# Patient Record
Sex: Female | Born: 1980
Health system: Southern US, Community
[De-identification: ages and names within clinical notes are randomized; demographics above are authoritative.]

## PROBLEM LIST (undated history)

## (undated) ENCOUNTER — Emergency Department: Discharge status: Absent without leave | Payer: Self-pay

## (undated) DIAGNOSIS — F419 Anxiety disorder, unspecified: Secondary | ICD-10-CM

## (undated) DIAGNOSIS — E282 Polycystic ovarian syndrome: Secondary | ICD-10-CM

## (undated) DIAGNOSIS — B342 Coronavirus infection, unspecified: Secondary | ICD-10-CM

## (undated) DIAGNOSIS — F32A Depression, unspecified: Secondary | ICD-10-CM

## (undated) DIAGNOSIS — R42 Dizziness and giddiness: Secondary | ICD-10-CM

## (undated) DIAGNOSIS — E119 Type 2 diabetes mellitus without complications: Secondary | ICD-10-CM

## (undated) DIAGNOSIS — E039 Hypothyroidism, unspecified: Secondary | ICD-10-CM

## (undated) DIAGNOSIS — E786 Lipoprotein deficiency: Secondary | ICD-10-CM

## (undated) DIAGNOSIS — K760 Fatty (change of) liver, not elsewhere classified: Secondary | ICD-10-CM

## (undated) DIAGNOSIS — B019 Varicella without complication: Secondary | ICD-10-CM

## (undated) DIAGNOSIS — N979 Female infertility, unspecified: Secondary | ICD-10-CM

## (undated) HISTORY — DX: Lipoprotein deficiency: E78.6

## (undated) HISTORY — DX: Type 2 diabetes mellitus without complications: E11.9

## (undated) HISTORY — DX: Fatty (change of) liver, not elsewhere classified: K76.0

## (undated) HISTORY — DX: Polycystic ovarian syndrome: E28.2

## (undated) HISTORY — PX: EYE SURGERY: SHX253

## (undated) HISTORY — DX: Varicella without complication: B01.9

## (undated) HISTORY — DX: Female infertility, unspecified: N97.9

## (undated) HISTORY — PX: MANDIBLE SURGERY: SHX707

## (undated) HISTORY — DX: Depression, unspecified: F32.A

## (undated) HISTORY — PX: TONSILLECTOMY: SUR1361

## (undated) HISTORY — PX: NASAL SINUS SURGERY: SHX719

---

## 2006-11-25 ENCOUNTER — Other Ambulatory Visit: Admission: RE | Admit: 2006-11-25 | Discharge: 2006-11-25 | Payer: Self-pay | Admitting: Obstetrics and Gynecology

## 2007-05-19 ENCOUNTER — Encounter: Admission: RE | Admit: 2007-05-19 | Discharge: 2007-06-08 | Payer: Self-pay | Admitting: Obstetrics and Gynecology

## 2008-01-12 ENCOUNTER — Other Ambulatory Visit: Admission: RE | Admit: 2008-01-12 | Discharge: 2008-01-12 | Payer: Self-pay | Admitting: Obstetrics and Gynecology

## 2008-07-28 ENCOUNTER — Encounter: Admission: RE | Admit: 2008-07-28 | Discharge: 2008-07-28 | Payer: Self-pay | Admitting: Family Medicine

## 2009-12-06 ENCOUNTER — Observation Stay (HOSPITAL_COMMUNITY): Admission: AD | Admit: 2009-12-06 | Discharge: 2009-12-07 | Payer: Self-pay | Admitting: Obstetrics and Gynecology

## 2009-12-07 ENCOUNTER — Encounter: Payer: Self-pay | Admitting: Obstetrics & Gynecology

## 2009-12-25 ENCOUNTER — Inpatient Hospital Stay (HOSPITAL_COMMUNITY): Admission: AD | Admit: 2009-12-25 | Discharge: 2009-12-29 | Payer: Self-pay | Admitting: Obstetrics & Gynecology

## 2009-12-27 ENCOUNTER — Encounter (INDEPENDENT_AMBULATORY_CARE_PROVIDER_SITE_OTHER): Payer: Self-pay | Admitting: Obstetrics & Gynecology

## 2010-09-24 ENCOUNTER — Ambulatory Visit (INDEPENDENT_AMBULATORY_CARE_PROVIDER_SITE_OTHER): Payer: Self-pay | Admitting: Family Medicine

## 2010-09-24 DIAGNOSIS — Z713 Dietary counseling and surveillance: Secondary | ICD-10-CM

## 2010-10-07 LAB — CREATININE CLEARANCE, URINE, 24 HOUR
Collection Interval-CRCL: 24 hours
Creatinine, 24H Ur: 1916 mg/d — ABNORMAL HIGH (ref 700–1800)
Creatinine, Urine: 37.2 mg/dL
Creatinine: 0.53 mg/dL (ref 0.4–1.2)
Urine Total Volume-CRCL: 5150 mL

## 2010-10-07 LAB — COMPREHENSIVE METABOLIC PANEL
ALT: 127 U/L — ABNORMAL HIGH (ref 0–35)
ALT: 72 U/L — ABNORMAL HIGH (ref 0–35)
ALT: 21 U/L (ref 0–35)
AST: 29 U/L (ref 0–37)
AST: 85 U/L — ABNORMAL HIGH (ref 0–37)
AST: 19 U/L (ref 0–37)
Albumin: 2.8 g/dL — ABNORMAL LOW (ref 3.5–5.2)
Albumin: 2.9 g/dL — ABNORMAL LOW (ref 3.5–5.2)
Albumin: 2.7 g/dL — ABNORMAL LOW (ref 3.5–5.2)
Alkaline Phosphatase: 120 U/L — ABNORMAL HIGH (ref 39–117)
Alkaline Phosphatase: 79 U/L (ref 39–117)
BUN: 8 mg/dL (ref 6–23)
Calcium: 8.1 mg/dL — ABNORMAL LOW (ref 8.4–10.5)
Chloride: 102 mEq/L (ref 96–112)
Creatinine, Ser: 0.53 mg/dL (ref 0.4–1.2)
GFR calc Af Amer: >60 mL/min (ref >60)
GFR calc Af Amer: >60 mL/min (ref >60)
GFR calc Af Amer: >60 mL/min (ref >60)
GFR calc non Af Amer: >60 mL/min (ref >60)
Potassium: 3.9 mEq/L (ref 3.5–5.1)
Potassium: 3.6 mEq/L (ref 3.5–5.1)
Sodium: 134 mEq/L — ABNORMAL LOW (ref 135–145)
Sodium: 134 mEq/L — ABNORMAL LOW (ref 135–145)
Total Bilirubin: 0.3 mg/dL (ref 0.3–1.2)
Total Protein: 5.7 g/dL — ABNORMAL LOW (ref 6.0–8.3)
Total Protein: 5.9 g/dL — ABNORMAL LOW (ref 6.0–8.3)
Total Protein: 6.3 g/dL (ref 6.0–8.3)

## 2010-10-07 LAB — CBC
HCT: 40.2 % (ref 36.0–46.0)
HCT: 41 % (ref 36.0–46.0)
HCT: 37.2 % (ref 36.0–46.0)
Hemoglobin: 13.9 g/dL (ref 12.0–15.0)
MCHC: 33.8 g/dL (ref 30.0–36.0)
MCHC: 33.9 g/dL (ref 30.0–36.0)
MCV: 82.5 fL (ref 78.0–100.0)
MCV: 82.5 fL (ref 78.0–100.0)
Platelets: 310 10*3/uL (ref 150–400)
Platelets: 332 10*3/uL (ref 150–400)
Platelets: 347 10*3/uL (ref 150–400)
RDW: 16.2 % — ABNORMAL HIGH (ref 11.5–15.5)
RDW: 16.3 % — ABNORMAL HIGH (ref 11.5–15.5)
RDW: 16.3 % — ABNORMAL HIGH (ref 11.5–15.5)
RDW: 16.7 % — ABNORMAL HIGH (ref 11.5–15.5)
RDW: 16.1 % — ABNORMAL HIGH (ref 11.5–15.5)
WBC: 10.6 10*3/uL — ABNORMAL HIGH (ref 4.0–10.5)
WBC: 10.6 10*3/uL — ABNORMAL HIGH (ref 4.0–10.5)
WBC: 12.3 10*3/uL — ABNORMAL HIGH (ref 4.0–10.5)

## 2010-10-07 LAB — GLUCOSE, CAPILLARY
Glucose-Capillary: 107 mg/dL — ABNORMAL HIGH (ref 70–99)
Glucose-Capillary: 120 mg/dL — ABNORMAL HIGH (ref 70–99)
Glucose-Capillary: 122 mg/dL — ABNORMAL HIGH (ref 70–99)
Glucose-Capillary: 138 mg/dL — ABNORMAL HIGH (ref 70–99)
Glucose-Capillary: 142 mg/dL — ABNORMAL HIGH (ref 70–99)
Glucose-Capillary: 142 mg/dL — ABNORMAL HIGH (ref 70–99)
Glucose-Capillary: 155 mg/dL — ABNORMAL HIGH (ref 70–99)
Glucose-Capillary: 111 mg/dL — ABNORMAL HIGH (ref 70–99)
Glucose-Capillary: 139 mg/dL — ABNORMAL HIGH (ref 70–99)
Glucose-Capillary: 147 mg/dL — ABNORMAL HIGH (ref 70–99)
Glucose-Capillary: 93 mg/dL (ref 70–99)

## 2010-10-07 LAB — URIC ACID
Uric Acid, Serum: 4.8 mg/dL (ref 2.4–7.0)
Uric Acid, Serum: 5.2 mg/dL (ref 2.4–7.0)

## 2010-10-07 LAB — LACTATE DEHYDROGENASE: LDH: 131 U/L (ref 94–250)

## 2010-10-07 LAB — RPR: RPR Ser Ql: NONREACTIVE

## 2010-12-03 ENCOUNTER — Other Ambulatory Visit: Payer: Self-pay | Admitting: Dermatology

## 2011-09-13 DIAGNOSIS — Z8742 Personal history of other diseases of the female genital tract: Secondary | ICD-10-CM | POA: Insufficient documentation

## 2011-09-22 ENCOUNTER — Ambulatory Visit: Payer: Self-pay | Admitting: Family Medicine

## 2011-09-25 ENCOUNTER — Encounter: Payer: Self-pay | Admitting: Family Medicine

## 2011-09-25 ENCOUNTER — Ambulatory Visit (INDEPENDENT_AMBULATORY_CARE_PROVIDER_SITE_OTHER): Payer: 59 | Admitting: Family Medicine

## 2011-09-25 DIAGNOSIS — E282 Polycystic ovarian syndrome: Secondary | ICD-10-CM | POA: Insufficient documentation

## 2011-09-25 DIAGNOSIS — E669 Obesity, unspecified: Secondary | ICD-10-CM

## 2011-09-25 NOTE — Progress Notes (Signed)
Medical Nutrition Therapy:  Appt start time: 1000 end time:  1100.  Assessment:  Primary concerns today: Weight management.  Tammy Mooney has struggled with weight since high school.  Tammy Mooney was diagnosed with PCOS 3-4 yrs ago.  Tammy Mooney has found that increased activity has been most helpful to her wt mgmt, and Tammy Mooney has been exercising consistently for the past month, as well as trying to make healthier food choices.  Tammy Mooney has been tracking food and activity using My[employer]itnessPal.  Usual eating pattern includes 3 meals and 1 AM snack per day.  Everyday foods include breakfast of protein drink (in 2 c 1% milk)/cereal (Golden Grahams or Honey Nut Cheerios)/2 eggs.  Tammy Mooney likes a wide variety of foods.  Major obstacle to better choices is time constraints.   24-hr recall suggests intake of 2000 kcal: B (8 AM)-   16 oz protein shake (340 kcal, 36 g protein)  Snk ( AM)-   Banana, Greek yogurt (120 kcal)  L ( PM)-  Large salad (beans, olives, cheese, tom, let, car, 2 tbsp dressing), 8 oz corn chowder, 2 oatmeal-raisin cookies, water.  D ( PM)-  [place] Mike's 12-in Ital sub (680 kcal).   Usual physical activity includes [employer] personal trainer 30 min 1 X wk, plus 5 X wk hour of cardio & wts (various exercises).  Progress Towards Goal(s):  In progress.   Nutritional Diagnosis:  NI-5.6.2 Excessive fat intake As related to various food choices.  As evidenced by recent intake of 75 grams of fat.    Intervention:  Nutrition education.  Monitoring/Evaluation:  Dietary intake, exercise, and body weight in 3 weeks.

## 2011-09-25 NOTE — Patient Instructions (Addendum)
-   Breakfast:  Make sure you include a substantial amount of protein, i.e., at least 20 grams.  - Track your breakfast intake:  What, how much, and what time; then record time at which you first notice hunger.  Which breakfasts sustain you better?  - Consider a [place] sandwich for breakfast on low-carb bread.  - Limit fat intake to 35-45 grams per day.   - Carb's:  Choose high-fiber.  For example, choose only cereals with at least 5 grams of fiber per serving.   - Fruit:  The best choices will be berries, apples, oranges.   - Vegetables: Include at both lunch and dinner.    - Keep on hand frozen veg's, fresh baby carrots, sugar snap peas, cucumbers.  - Make a list of at least 7 meals you like, meet your nutritional needs, and are relatively easy to prepare.  Post this in the kitchen, and use as a basis for shopping.   - Goal:  Set aside planning time each week.  Put this calendar event in your phone.   - Check out website:  BodyPens.ca.  Look into glycemic load & glycemic index.

## 2011-10-16 ENCOUNTER — Ambulatory Visit: Payer: Self-pay | Admitting: Family Medicine

## 2011-10-20 ENCOUNTER — Ambulatory Visit: Payer: Self-pay | Admitting: Family Medicine

## 2011-10-27 ENCOUNTER — Encounter: Payer: Self-pay | Admitting: Family Medicine

## 2011-10-27 ENCOUNTER — Ambulatory Visit (INDEPENDENT_AMBULATORY_CARE_PROVIDER_SITE_OTHER): Payer: 59 | Admitting: Family Medicine

## 2011-10-27 VITALS — Ht 66.0 in | Wt 238.1 lb

## 2011-10-27 DIAGNOSIS — E669 Obesity, unspecified: Secondary | ICD-10-CM

## 2011-10-27 DIAGNOSIS — E282 Polycystic ovarian syndrome: Secondary | ICD-10-CM

## 2011-10-27 NOTE — Progress Notes (Signed)
Medical Nutrition Therapy:  Appt start time: 1000 end time:  1100.  Assessment:  Primary concerns today: Weight management.  Tammy Mooney has been exercising at least 1 hour 3-5 X wk for the past 2 months, including her [employer] personal trainer 30 min 1 X wk, plus 5 X wk hour of cardio & wts (various exercises).  She stopped tracking foods for a couple weeks, and has started feeling a little discouraged that she hadn't lost weight.  Tammy Mooney suspects she will need to severely limit her kcal to be able to realize any meaningful weight loss.   24-hr recall:  B (9 AM)-   3 mini donuts Brunch (11 AM)-  veg quiche, 1 biscuit w/ 1/4 c sausage gravy, 1 c fruit salad, 1 c orange juice L ( PM)-   water Snk ( PM)-   <2 oz cheese & 8 Ritz crackers, water, 4 oz grape juice,  D (6:30 PM)-   3/4 c potato salad, <1/4 c baked beans, 5 oz pot roast, salad w/ 2 tbsp dressing Snk ( PM)-   water Yesterday was atypical b/c they were entertaining relatives:  brunch instead of breakfast and lunch; juice; greater amount of food than usual.    Progress Towards Goal(s):  In progress.   Nutritional Diagnosis:  NI-5.6.2 Excessive fat intake As related to various food choices.  As evidenced by recent intake of 75 grams of fat.    Intervention:  Nutrition education.  Monitoring/Evaluation:  Dietary intake, exercise, and body weight in 2 weeks.

## 2011-10-27 NOTE — Patient Instructions (Signed)
-   Modifications of menu/food prep: Low-fat sausage, skim milk in biscuits and in gravy, use less fat in gravy (or use olive oil).  Offer seltzer with juice.     - Consider how you can make this the healthiest offerings for you and for everyone.  - Rule of thumb:  NO sweets in the AM.  How we start our day sets up our appetite.   - Breakfast:  For the next two weeks, write down what you eat, what time, and how much.  Then add also the time at which you first notice hunger.    - You'll probably find that the higher-protein meals help stave off hunger longer.     - Example:  Meat sandwich; Greek yogurt and fruit (and 1-2 tbsp nuts/seeds); Eggs with toast (prepared in olive oil or spray) and fruit; protein drink with fruit.     - Cereals:  You may see a difference between high- and low-fiber cereals.  Also, the amount of milk used can affect satiation, or you may find that yogurt instead of milk is helpful. - Experiment with cutting way back on your carb's to no more than one per meal.    - ONE carb portion = 15 g of carb / 1 slice bread / 1/2 c most "scoopable" carb foods, 1 oz of packaged carb snacks.    - Do not count carb's in fruit, veg's, or dairy.      - Make a list of at least 7 meals you like, meet your nutritional needs, and are relatively easy to prepare. Post this in the kitchen, and use as a basis for shopping.  - Goal: Set aside planning time each week. Put this calendar event in your phone.

## 2011-11-10 ENCOUNTER — Ambulatory Visit (INDEPENDENT_AMBULATORY_CARE_PROVIDER_SITE_OTHER): Payer: 59 | Admitting: Family Medicine

## 2011-11-10 ENCOUNTER — Encounter: Payer: Self-pay | Admitting: Family Medicine

## 2011-11-10 VITALS — Ht 66.0 in | Wt 239.0 lb

## 2011-11-10 DIAGNOSIS — E669 Obesity, unspecified: Secondary | ICD-10-CM

## 2011-11-10 DIAGNOSIS — E282 Polycystic ovarian syndrome: Secondary | ICD-10-CM

## 2011-11-10 NOTE — Progress Notes (Signed)
Medical Nutrition Therapy:  Appt start time: 1200 end time:  1245.  Assessment:  Primary concerns today: Weight management and diet for PCOS.  Tammy Mooney was sick a couple of weeks ago, so was unable to exercise, and ate little.  She has been eating higher-protein breakfasts on most days, but has not done especially well with other goals.  She feels pretty demoralized with no weight loss following a couple weeks of consistent exercise and recording daily intake.  We discussed the importance of formal monitoring of goals, and developed a goals sheet for Tammy Mooney to use.  Tammy Mooney is familiar with all of these concepts, but struggles putting them into practice, like most others.  Three-day food record indicated high-CHO breakfasts on 2 days, followed each day by lunch and/or dinners with inadequate veg's and excess CHO.    Progress Towards Goal(s):  In progress.   Nutritional Diagnosis:   Some progress on Fat goal:  NI-5.8.2 Excessive carbohydrate intake As related to primarily beverages.  As evidenced by soda and/or juice consumption each of previous 3 days. NI-5.6.2 Excessive fat intake As related to various food choices.  As evidenced by 3-day food record suggesting less than 75 grams of fat/day.    Intervention:  Nutrition education.  Monitoring/Evaluation:  Dietary intake, exercise, and body weight in 2 weeks.

## 2011-11-10 NOTE — Patient Instructions (Signed)
-   Advance planning for high-protein breakfasts:  Adriana Simas a big batch of chicken (or [place] breast).  - Continue to plan menus for the week, and shop with a list.   - Keep seltzer on hand just in case you have juice, and make it a habit, if you have juice, it's always with seltzer.   - Food goals:  1. No-calorie beverages only.  Choose water.    2. Limit carb's to 1 per meal and 1 per snack (= 15 grams of carb).    3. Vegetables twice a day.   - Making food a priority in your life will make all of your goals more accessible/achievable.  - Goals sheet daily.

## 2011-11-24 ENCOUNTER — Ambulatory Visit (INDEPENDENT_AMBULATORY_CARE_PROVIDER_SITE_OTHER): Payer: 59 | Admitting: Family Medicine

## 2011-11-24 ENCOUNTER — Encounter: Payer: Self-pay | Admitting: Family Medicine

## 2011-11-24 VITALS — Ht 66.0 in | Wt 236.1 lb

## 2011-11-24 DIAGNOSIS — E669 Obesity, unspecified: Secondary | ICD-10-CM

## 2011-11-24 DIAGNOSIS — E282 Polycystic ovarian syndrome: Secondary | ICD-10-CM

## 2011-11-24 NOTE — Progress Notes (Signed)
Medical Nutrition Therapy:  Appt start time: 1000 end time:  1100.  Assessment:  Primary concerns today: Weight management and diet for PCOS.  Tammy Mooney feels her success with keeping her goals has been ~50%.  She has done better at not eating out, and in getting veg's and limiting carb's at lunch time especially.  She has been exercising an hour 2-3 X wk; [facility]  (She now has to make the decision to [facility] to continue [facility].)  Her HDL increased from 23 to 40 during the 12-wk program.  Tammy Mooney has been getting a good amount of protein at breakfast most days.  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    Progress Towards Goal(s):  In progress.   Nutritional Diagnosis:   Some progress on Fat goal:  NI-5.8.2 Excessive carbohydrate intake As related to primarily beverages.  As evidenced by soda and/or juice consumption each of previous 3 days. NI-5.6.2 Excessive fat intake As related to various food choices.  As evidenced by 3-day food record suggesting less than 75 grams of fat/day.    Intervention:  Nutrition education.  Monitoring/Evaluation:  Dietary intake, exercise, and body weight in 2 weeks.

## 2011-11-24 NOTE — Patient Instructions (Signed)
-   For the next 5 wks especially, it'll be crucial that you plan your week ahead:  - Meals  - Exercise days - It will also be helpful to you to  - Remember the "mindless margin," and choose your foods mindfully.    - Do I want all of this?  - Am I really in the mood for this?  - Am I really hungry / this hungry? - The 3 Qs of a good food decision:  1. How hungry am I?  2. What am I in the mood for?  3. What's good for me? - Sources of mono-unsaturated fats: Avocado, unsalted nuts & seeds, olive oil, (canola & peanut).   - Continue to work on 3 food goals previously set, and use goals sheet (veg's 2 X day, no beverages with kcal, <1 carb per snack & meal).

## 2011-12-12 LAB — OB RESULTS CONSOLE RUBELLA ANTIBODY, IGM: Rubella: IMMUNE

## 2011-12-12 LAB — OB RESULTS CONSOLE HIV ANTIBODY (ROUTINE TESTING): HIV: NONREACTIVE

## 2011-12-12 LAB — OB RESULTS CONSOLE ABO/RH

## 2011-12-12 LAB — OB RESULTS CONSOLE GC/CHLAMYDIA: Gonorrhea: NEGATIVE

## 2011-12-18 ENCOUNTER — Ambulatory Visit: Payer: 59 | Admitting: Family Medicine

## 2012-01-01 ENCOUNTER — Ambulatory Visit (INDEPENDENT_AMBULATORY_CARE_PROVIDER_SITE_OTHER): Payer: 59 | Admitting: Family Medicine

## 2012-01-01 ENCOUNTER — Encounter: Payer: Self-pay | Admitting: Family Medicine

## 2012-01-01 VITALS — Ht 66.0 in | Wt 238.4 lb

## 2012-01-01 DIAGNOSIS — E669 Obesity, unspecified: Secondary | ICD-10-CM

## 2012-01-01 NOTE — Progress Notes (Signed)
Medical Nutrition Therapy:  Appt start time: 1400 end time:  1500.  Assessment:  Primary concerns today: Weight management and diet for PCOS.  Tammy Mooney feels her food choices have been pretty good.  She has been focused on her goals related to veg's, water, & soda, but has not been completing her goals sheet.   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  She has an exercise partner she's already talked to about working out together.    24-hr recall suggests intake of 1560 kcal:  B (7 AM)-  1 c Cocoa Crisp, 1 c 1% milk     200 Snk (10 AM)-  1 apple, cheese stick, 5 saltines    200 L (12:30 PM)-  [place]'s grilled chx brst w/ veg's, 1/2 rice, water  400 Snk ( PM)-  water D (6 PM)-  <1 c spaghetti, 3/4 c ground [place] sauce, 1/2 c broccoli, 3 small pcs frzn garlic bread, water, ice crm bar  Snk ( PM)-  water  Progress Towards Goal(s):  In progress.   Nutritional Diagnosis:   Good progress on both carb's and fat goals:  NI-5.8.2 Excessive carbohydrate intake As related to primarily beverages.  As evidenced by only occasional soda recently. NI-5.6.2 Excessive fat intake As related to various food choices.  As evidenced by 24-hr recall suggesting moderate/low-fat intake.    Intervention:  Nutrition education.  Monitoring/Evaluation:  Dietary intake, exercise, and body weight in 4 weeks.

## 2012-01-01 NOTE — Patient Instructions (Addendum)
-   Exercise goal: 60 min 2-3 X wk.    - Coordinate w/ husband and exercise partner for CONSISTENT weekly workout times.   - Physical activity throughout the day as much as possible.    - Micro-breaks:  5 min per hour or 1 min per 20 min.    - Take the long way around to the bathroom or to front or back office.   - Leland Johns video:  http://vimeo.EAV/40981191. - Complete goals sheet.   - Obtain more protein-based breakfasts.  If you do eat cereal, make sure it provides at least 5 grams of fiber per serving.    - Other cereals:  Wheat Chex, Raisin Bran and other cereals with "bran" in their name, All Bran, Bran Buds, Frosted Mini-Wheats, Shredded Wheat 'n Bran, some Kashi cereals.   - Remember the study about walking 15 min post-dinner to control blood sugar.

## 2012-05-18 ENCOUNTER — Encounter: Payer: Self-pay | Admitting: Family Medicine

## 2012-06-30 ENCOUNTER — Telehealth (HOSPITAL_COMMUNITY): Payer: Self-pay | Admitting: *Deleted

## 2012-06-30 ENCOUNTER — Encounter (HOSPITAL_COMMUNITY): Payer: Self-pay | Admitting: *Deleted

## 2012-06-30 ENCOUNTER — Inpatient Hospital Stay (HOSPITAL_COMMUNITY)
Admission: RE | Admit: 2012-06-30 | Discharge: 2012-07-03 | DRG: 766 | Disposition: A | Discharge status: Home / Self Care | Payer: 59 | Source: Ambulatory Visit | Attending: Obstetrics and Gynecology | Admitting: Obstetrics and Gynecology

## 2012-06-30 ENCOUNTER — Encounter (HOSPITAL_COMMUNITY): Payer: Self-pay

## 2012-06-30 VITALS — BP 99/61 | HR 88 | Temp 97.8°F | Resp 18 | Ht 66.0 in | Wt 265.0 lb

## 2012-06-30 DIAGNOSIS — O24419 Gestational diabetes mellitus in pregnancy, unspecified control: Secondary | ICD-10-CM

## 2012-06-30 DIAGNOSIS — O34219 Maternal care for unspecified type scar from previous cesarean delivery: Secondary | ICD-10-CM | POA: Diagnosis present

## 2012-06-30 DIAGNOSIS — Z98891 History of uterine scar from previous surgery: Secondary | ICD-10-CM

## 2012-06-30 DIAGNOSIS — O99892 Other specified diseases and conditions complicating childbirth: Secondary | ICD-10-CM | POA: Diagnosis present

## 2012-06-30 DIAGNOSIS — E669 Obesity, unspecified: Secondary | ICD-10-CM

## 2012-06-30 DIAGNOSIS — E282 Polycystic ovarian syndrome: Secondary | ICD-10-CM

## 2012-06-30 DIAGNOSIS — Z22330 Carrier of Group B streptococcus: Secondary | ICD-10-CM

## 2012-06-30 DIAGNOSIS — O99814 Abnormal glucose complicating childbirth: Principal | ICD-10-CM | POA: Diagnosis present

## 2012-06-30 LAB — CBC
HCT: 39.3 % (ref 36.0–46.0)
MCV: 80 fL (ref 78.0–100.0)
Platelets: 321 10*3/uL (ref 150–400)
RBC: 4.91 MIL/uL (ref 3.87–5.11)
WBC: 11.7 10*3/uL — ABNORMAL HIGH (ref 4.0–10.5)

## 2012-06-30 LAB — GLUCOSE, CAPILLARY: Glucose-Capillary: 118 mg/dL — ABNORMAL HIGH (ref 70–99)

## 2012-06-30 MED ORDER — TERBUTALINE SULFATE 1 MG/ML IJ SOLN: 0.2500 mg | Freq: Once | INTRAMUSCULAR | Status: AC | PRN

## 2012-06-30 MED ORDER — OXYTOCIN BOLUS FROM INFUSION: 500.0000 mL | INTRAVENOUS | Status: DC

## 2012-06-30 MED ORDER — LIDOCAINE HCL (PF) 1 % IJ SOLN: 30.0000 mL | INTRAMUSCULAR | Status: DC | PRN

## 2012-06-30 MED ORDER — ONDANSETRON HCL 4 MG/2ML IJ SOLN
4.0000 mg | Freq: Four times a day (QID) | INTRAMUSCULAR | Status: DC | PRN
  Administered 2012-06-30: 4 mg via INTRAVENOUS
  Filled 2012-06-30: qty 2

## 2012-06-30 MED ORDER — OXYTOCIN 40 UNITS IN LACTATED RINGERS INFUSION - SIMPLE MED
62.5000 mL/h | INTRAVENOUS | Status: DC
  Filled 2012-06-30: qty 1000

## 2012-06-30 MED ORDER — CITRIC ACID-SODIUM CITRATE 334-500 MG/5ML PO SOLN
30.0000 mL | ORAL | Status: DC | PRN
  Administered 2012-07-01: 30 mL via ORAL
  Filled 2012-06-30: qty 15

## 2012-06-30 MED ORDER — OXYCODONE-ACETAMINOPHEN 5-325 MG PO TABS: 1.0000 | ORAL_TABLET | ORAL | Status: DC | PRN

## 2012-06-30 MED ORDER — LACTATED RINGERS IV SOLN
INTRAVENOUS | Status: DC
  Administered 2012-06-30 – 2012-07-01 (×2): via INTRAVENOUS

## 2012-06-30 MED ORDER — ACETAMINOPHEN 325 MG PO TABS: 650.0000 mg | ORAL_TABLET | ORAL | Status: DC | PRN

## 2012-06-30 MED ORDER — FENTANYL CITRATE 0.05 MG/ML IJ SOLN
100.0000 ug | INTRAMUSCULAR | Status: DC | PRN
  Administered 2012-06-30: 100 ug via INTRAVENOUS
  Filled 2012-06-30: qty 2

## 2012-06-30 MED ORDER — ZOLPIDEM TARTRATE 5 MG PO TABS: 5.0000 mg | ORAL_TABLET | Freq: Every evening | ORAL | Status: DC | PRN

## 2012-06-30 MED ORDER — VANCOMYCIN HCL IN DEXTROSE 1-5 GM/200ML-% IV SOLN
1000.0000 mg | Freq: Two times a day (BID) | INTRAVENOUS | Status: DC
  Administered 2012-06-30 – 2012-07-01 (×2): 1000 mg via INTRAVENOUS
  Filled 2012-06-30 (×3): qty 200

## 2012-06-30 MED ORDER — LACTATED RINGERS IV SOLN: 500.0000 mL | INTRAVENOUS | Status: DC | PRN

## 2012-06-30 MED ORDER — IBUPROFEN 600 MG PO TABS: 600.0000 mg | ORAL_TABLET | Freq: Four times a day (QID) | ORAL | Status: DC | PRN

## 2012-06-30 NOTE — Telephone Encounter (Signed)
Preadmission screen  

## 2012-06-30 NOTE — H&P (Signed)
Tammy Mooney is a 31 y.o. female, G2P1001 at [redacted]w[redacted]d , presenting for IOL , Hx of previous C/S for breech presentation together with oligohydramnios.PCOS. Hx of Infertility. GDM- A2, had been on Metformin and transitioned to Glyburide 5mg s and better diet control at end of pregnancy. FBS: 87- 96 and postprandial <125  At [redacted] weeks GA.  BPP 06/28/12 8/8 fetal size AGA. Plan for Cervical Balloon, AROM. Consider Pitocin with IUPC versus C/S. Patient desires TOLAC  GBS positive - sensitivity - Vancomycin.  Patient Active Problem List  Diagnosis  . PCOS (polycystic ovarian syndrome)  . Obesity (BMI 35.0-39.9 without comorbidity)    History of present pregnancy: Patient entered care at [redacted]w[redacted]d.   EDC of12/18/13 was established by LMP and Sono.   Anatomy scan:  18 weeks, with normal findings and an anterior placenta.   Additional [name] evaluations:  03/05/12, 03/29/12, 04/19/12, 06/04/12, 06/28/12. Last BBP 06/28/12 8/8  Significant prenatal events:  GDM, GBS positive with sensitivity for Vancomycin.  Last evaluation: 06/28/12 at office with BBP   OB History    Grav Para Term Preterm Abortions TAB SAB Ect Mult Living   2 1 0 1      1     Past Medical History  Diagnosis Date  . H/O varicella   . PCOS (polycystic ovarian syndrome)   . Infections of genitourinary tract in pregnancy, unspecified as to episode of care(646.60)   . Diabetes mellitus without complication     glyburide   Past Surgical History  Procedure Date  . Cesarean section   . Tonsillectomy   . Eye surgery     lasic  . Nasal sinus surgery    Family History: family history includes Diabetes in her mother and paternal grandmother; Heart attack in her father; and Heart disease in her maternal grandfather. Social History:  reports that she has never smoked. She has never used smokeless tobacco. She reports that she does not drink alcohol. Her drug history not on file.   Prenatal Transfer Tool  Maternal Diabetes: Yes:  Diabetes  Type:  Insulin/Medication controlled, Diet controlled at end of pregnancy Glyburide 5mg s  Genetic Screening: Normal Maternal Ultrasounds/Referrals: Normal Fetal Ultrasounds or other Referrals:  None Maternal Substance Abuse:  No Significant Maternal Medications:  None Significant Maternal Lab Results: Lab values include: Group B Strep positive, Other: GDM    ROS:    Allergies  Allergen Reactions  . Penicillins        Last menstrual period 10/02/2011. Affect; AAO x 3 Lungs: CTAB Heart RRR without murmur Abd gravid, NT, FH  To dates Pelvic: normal Ext: norma;  FHR: 155 bpm UCs:  1: 10 mins  Prenatal labs: ABO, Rh: A/Positive/-- (05/24 0000) Antibody: Negative (05/24 0000) Rubella:   immune RPR: Nonreactive (05/24 0000)  HBsAg: Negative (05/24 0000)  HIV: Non-reactive (05/24 0000)  GBS: Positive (05/24 0000) Sickle cell/Hgb electrophoresis: not completed Pap:  normal GC: Negative Chlamydia: Negative Genetic screenings:  Quad - normal Glucola:  Abnormal GDM - Glyburide and diet control.   Assessment/Plan: IUP at [redacted]w[redacted]d IOL with cervical Balloon/traction GBS Positive - Sensitive to Vancomycin.  Plan: Cervical Balloon placement - to traction Vancomycin IV for GBS prophylaxis  Tammy Mooney, DENISECNM. 06/30/2012, 7:43 PM

## 2012-06-30 NOTE — Progress Notes (Signed)
Called Davies cnm, informed of pts foley bulb coming out, to recheck cervix in about an hour and call her back, pt  May rest at this time.

## 2012-06-30 NOTE — Progress Notes (Signed)
Patient is uncomfortable with vaginal examination. Cx was posterior on initial examination.cx brought to mid-osition and ZO:[id number] Cx anterior  O VSS Filed Vitals:   06/30/12 2000  BP: 138/76  Pulse: 93  Temp: 97.7 F (36.5 C)  Resp: 20   Patient is very anxious  Blood glucose 119 2 hrs pp  Fht's baseline: 155 bpm Cat 1 tracing   abd soft between uc      Contractions 1: 10 min      SVE: External Os : 2cm/ Internal Os : 1-2 cms/ 50%/-3, Vx. Cervical Balloon placed with aid of stylette trans cervically and balloon inflated with 60 ml of water. Foley to water traction. Patient has started to contract 1: 2 mins since Balloon placement. Monitoring B/P as Systolic slight elevated - anxious +++ A IOL with placement of Foley Balloon to traction.    Fentanyl 100 mcgs IV q2 hrly PRN P Continue present management.    When Balloon falls out will consider AROM to augment labor pattern.  Earl Gala, CNM.

## 2012-07-01 ENCOUNTER — Encounter (HOSPITAL_COMMUNITY): Payer: Self-pay | Admitting: Anesthesiology

## 2012-07-01 ENCOUNTER — Encounter (HOSPITAL_COMMUNITY)
Admission: RE | Disposition: A | Discharge status: Home / Self Care | Payer: Self-pay | Source: Ambulatory Visit | Attending: Obstetrics and Gynecology

## 2012-07-01 ENCOUNTER — Inpatient Hospital Stay (HOSPITAL_COMMUNITY): Payer: 59 | Admitting: Anesthesiology

## 2012-07-01 ENCOUNTER — Encounter (HOSPITAL_COMMUNITY): Payer: Self-pay

## 2012-07-01 DIAGNOSIS — Z98891 History of uterine scar from previous surgery: Secondary | ICD-10-CM

## 2012-07-01 LAB — PREPARE RBC (CROSSMATCH)

## 2012-07-01 LAB — GLUCOSE, CAPILLARY
Glucose-Capillary: 92 mg/dL (ref 70–99)
Glucose-Capillary: 91 mg/dL (ref 70–99)
Glucose-Capillary: 92 mg/dL (ref 70–99)

## 2012-07-01 SURGERY — Surgical Case
Anesthesia: Regional

## 2012-07-01 SURGERY — Surgical Case
Anesthesia: Spinal | Site: Abdomen | Wound class: Clean Contaminated

## 2012-07-01 MED ORDER — SODIUM CHLORIDE 0.9 % IJ SOLN: 3.0000 mL | INTRAMUSCULAR | Status: DC | PRN

## 2012-07-01 MED ORDER — NALBUPHINE SYRINGE 5 MG/0.5 ML
5.0000 mg | INJECTION | INTRAMUSCULAR | Status: DC | PRN
  Filled 2012-07-01: qty 1

## 2012-07-01 MED ORDER — FENTANYL CITRATE 0.05 MG/ML IJ SOLN
INTRAMUSCULAR | Status: DC | PRN
  Administered 2012-07-01: 25 ug via INTRATHECAL

## 2012-07-01 MED ORDER — FENTANYL CITRATE 0.05 MG/ML IJ SOLN: 25.0000 ug | INTRAMUSCULAR | Status: DC | PRN

## 2012-07-01 MED ORDER — DIPHENHYDRAMINE HCL 50 MG/ML IJ SOLN: 12.5000 mg | INTRAMUSCULAR | Status: DC | PRN

## 2012-07-01 MED ORDER — NALOXONE HCL 1 MG/ML IJ SOLN: 1.0000 ug/kg/h | INTRAVENOUS | Status: DC | PRN

## 2012-07-01 MED ORDER — MEPERIDINE HCL 25 MG/ML IJ SOLN: 6.2500 mg | INTRAMUSCULAR | Status: DC | PRN

## 2012-07-01 MED ORDER — LACTATED RINGERS IV SOLN
INTRAVENOUS | Status: DC | PRN
  Administered 2012-07-01 (×3): via INTRAVENOUS

## 2012-07-01 MED ORDER — MORPHINE SULFATE (PF) 0.5 MG/ML IJ SOLN
INTRAMUSCULAR | Status: DC | PRN
  Administered 2012-07-01: .1 mg via INTRATHECAL

## 2012-07-01 MED ORDER — OXYTOCIN 10 UNIT/ML IJ SOLN
INTRAMUSCULAR | Status: AC
  Filled 2012-07-01: qty 4

## 2012-07-01 MED ORDER — NALBUPHINE SYRINGE 5 MG/0.5 ML
INJECTION | INTRAMUSCULAR | Status: AC
  Administered 2012-07-01: 10 mg via SUBCUTANEOUS
  Filled 2012-07-01: qty 1

## 2012-07-01 MED ORDER — ONDANSETRON HCL 4 MG/2ML IJ SOLN: 4.0000 mg | INTRAMUSCULAR | Status: DC | PRN

## 2012-07-01 MED ORDER — SCOPOLAMINE 1 MG/3DAYS TD PT72
MEDICATED_PATCH | TRANSDERMAL | Status: AC
  Administered 2012-07-01: 1.5 mg via TRANSDERMAL
  Filled 2012-07-01: qty 1

## 2012-07-01 MED ORDER — BUPIVACAINE HCL (PF) 0.25 % IJ SOLN
INTRAMUSCULAR | Status: AC
  Filled 2012-07-01: qty 30

## 2012-07-01 MED ORDER — MORPHINE SULFATE 0.5 MG/ML IJ SOLN
INTRAMUSCULAR | Status: AC
  Filled 2012-07-01: qty 10

## 2012-07-01 MED ORDER — MIDAZOLAM HCL 2 MG/2ML IJ SOLN: 0.5000 mg | Freq: Once | INTRAMUSCULAR | Status: DC | PRN

## 2012-07-01 MED ORDER — KETOROLAC TROMETHAMINE 30 MG/ML IJ SOLN: 30.0000 mg | Freq: Four times a day (QID) | INTRAMUSCULAR | Status: DC | PRN

## 2012-07-01 MED ORDER — DIBUCAINE 1 % RE OINT: 1.0000 "application " | TOPICAL_OINTMENT | RECTAL | Status: DC | PRN

## 2012-07-01 MED ORDER — METHYLERGONOVINE MALEATE 0.2 MG/ML IJ SOLN: 0.2000 mg | INTRAMUSCULAR | Status: DC | PRN

## 2012-07-01 MED ORDER — LACTATED RINGERS IV SOLN
INTRAVENOUS | Status: DC | PRN
  Administered 2012-07-01: 18:00:00 via INTRAVENOUS

## 2012-07-01 MED ORDER — DIPHENHYDRAMINE HCL 25 MG PO CAPS: 25.0000 mg | ORAL_CAPSULE | Freq: Four times a day (QID) | ORAL | Status: DC | PRN

## 2012-07-01 MED ORDER — METOCLOPRAMIDE HCL 5 MG/ML IJ SOLN: 10.0000 mg | Freq: Three times a day (TID) | INTRAMUSCULAR | Status: DC | PRN

## 2012-07-01 MED ORDER — EPHEDRINE SULFATE 50 MG/ML IJ SOLN
INTRAMUSCULAR | Status: DC | PRN
  Administered 2012-07-01: 10 mg via INTRAVENOUS
  Administered 2012-07-01: 5 mg via INTRAVENOUS
  Administered 2012-07-01: 10 mg via INTRAVENOUS
  Administered 2012-07-01: 5 mg via INTRAVENOUS
  Administered 2012-07-01 (×2): 10 mg via INTRAVENOUS

## 2012-07-01 MED ORDER — ONDANSETRON HCL 4 MG/2ML IJ SOLN
INTRAMUSCULAR | Status: DC | PRN
  Administered 2012-07-01: 4 mg via INTRAVENOUS

## 2012-07-01 MED ORDER — DIPHENHYDRAMINE HCL 50 MG/ML IJ SOLN: 25.0000 mg | INTRAMUSCULAR | Status: DC | PRN

## 2012-07-01 MED ORDER — IBUPROFEN 600 MG PO TABS: 600.0000 mg | ORAL_TABLET | Freq: Four times a day (QID) | ORAL | Status: DC | PRN

## 2012-07-01 MED ORDER — ACETAMINOPHEN 10 MG/ML IV SOLN: 1000.0000 mg | Freq: Four times a day (QID) | INTRAVENOUS | Status: DC | PRN

## 2012-07-01 MED ORDER — BUPIVACAINE HCL (PF) 0.25 % IJ SOLN
INTRAMUSCULAR | Status: DC | PRN
  Administered 2012-07-01: 10 mL

## 2012-07-01 MED ORDER — SIMETHICONE 80 MG PO CHEW
80.0000 mg | CHEWABLE_TABLET | Freq: Three times a day (TID) | ORAL | Status: DC
  Administered 2012-07-01 – 2012-07-03 (×6): 80 mg via ORAL

## 2012-07-01 MED ORDER — KETOROLAC TROMETHAMINE 30 MG/ML IJ SOLN
INTRAMUSCULAR | Status: AC
  Administered 2012-07-01: 30 mg via INTRAVENOUS
  Filled 2012-07-01: qty 1

## 2012-07-01 MED ORDER — SIMETHICONE 80 MG PO CHEW: 80.0000 mg | CHEWABLE_TABLET | ORAL | Status: DC | PRN

## 2012-07-01 MED ORDER — TERBUTALINE SULFATE 1 MG/ML IJ SOLN: 0.2500 mg | Freq: Once | INTRAMUSCULAR | Status: DC | PRN

## 2012-07-01 MED ORDER — NALOXONE HCL 0.4 MG/ML IJ SOLN: 0.4000 mg | INTRAMUSCULAR | Status: DC | PRN

## 2012-07-01 MED ORDER — SCOPOLAMINE 1 MG/3DAYS TD PT72
1.0000 | MEDICATED_PATCH | Freq: Once | TRANSDERMAL | Status: DC
Start: 2012-07-01 — End: 2012-07-03

## 2012-07-01 MED ORDER — ONDANSETRON HCL 4 MG/2ML IJ SOLN
4.0000 mg | Freq: Three times a day (TID) | INTRAMUSCULAR | Status: DC | PRN
Start: 2012-07-01 — End: 2012-07-03

## 2012-07-01 MED ORDER — SENNOSIDES-DOCUSATE SODIUM 8.6-50 MG PO TABS
2.0000 | ORAL_TABLET | Freq: Every day | ORAL | Status: DC
  Administered 2012-07-01 – 2012-07-02 (×2): 2 via ORAL

## 2012-07-01 MED ORDER — KETOROLAC TROMETHAMINE 30 MG/ML IJ SOLN: 30.0000 mg | Freq: Four times a day (QID) | INTRAMUSCULAR | Status: AC | PRN

## 2012-07-01 MED ORDER — LACTATED RINGERS IV SOLN
INTRAVENOUS | Status: DC
  Administered 2012-07-02: 03:00:00 via INTRAVENOUS

## 2012-07-01 MED ORDER — ZOLPIDEM TARTRATE 5 MG PO TABS: 5.0000 mg | ORAL_TABLET | Freq: Every evening | ORAL | Status: DC | PRN

## 2012-07-01 MED ORDER — BUPIVACAINE IN DEXTROSE 0.75-8.25 % IT SOLN
INTRATHECAL | Status: DC | PRN
  Administered 2012-07-01: 1.6 mL via INTRATHECAL

## 2012-07-01 MED ORDER — OXYTOCIN 40 UNITS IN LACTATED RINGERS INFUSION - SIMPLE MED: 62.5000 mL/h | INTRAVENOUS | Status: AC

## 2012-07-01 MED ORDER — DEXTROSE 5 % IV SOLN: 1.0000 ug/kg/h | INTRAVENOUS | Status: DC | PRN

## 2012-07-01 MED ORDER — OXYTOCIN 40 UNITS IN LACTATED RINGERS INFUSION - SIMPLE MED
1.0000 m[IU]/min | INTRAVENOUS | Status: DC
  Administered 2012-07-01: 1 m[IU]/min via INTRAVENOUS

## 2012-07-01 MED ORDER — METHYLERGONOVINE MALEATE 0.2 MG PO TABS: 0.2000 mg | ORAL_TABLET | ORAL | Status: DC | PRN

## 2012-07-01 MED ORDER — ONDANSETRON HCL 4 MG PO TABS: 4.0000 mg | ORAL_TABLET | ORAL | Status: DC | PRN

## 2012-07-01 MED ORDER — TETANUS-DIPHTH-ACELL PERTUSSIS 5-2.5-18.5 LF-MCG/0.5 IM SUSP: 0.5000 mL | Freq: Once | INTRAMUSCULAR | Status: DC

## 2012-07-01 MED ORDER — OXYTOCIN 10 UNIT/ML IJ SOLN: 40.0000 [IU] | INTRAVENOUS | Status: DC | PRN

## 2012-07-01 MED ORDER — WITCH HAZEL-GLYCERIN EX PADS: 1.0000 "application " | MEDICATED_PAD | CUTANEOUS | Status: DC | PRN

## 2012-07-01 MED ORDER — OXYCODONE-ACETAMINOPHEN 5-325 MG PO TABS
1.0000 | ORAL_TABLET | ORAL | Status: DC | PRN
  Administered 2012-07-02: 1 via ORAL
  Administered 2012-07-02 – 2012-07-03 (×3): 2 via ORAL
  Filled 2012-07-01 (×3): qty 2
  Filled 2012-07-01: qty 1

## 2012-07-01 MED ORDER — SODIUM CHLORIDE 0.9 % IR SOLN
Status: DC | PRN
  Administered 2012-07-01: 1000 mL

## 2012-07-01 MED ORDER — OXYTOCIN 10 UNIT/ML IJ SOLN
40.0000 [IU] | INTRAVENOUS | Status: DC | PRN
  Administered 2012-07-01: 40 [IU] via INTRAVENOUS

## 2012-07-01 MED ORDER — ONDANSETRON HCL 4 MG/2ML IJ SOLN
INTRAMUSCULAR | Status: AC
  Filled 2012-07-01: qty 2

## 2012-07-01 MED ORDER — KETOROLAC TROMETHAMINE 30 MG/ML IJ SOLN: 15.0000 mg | Freq: Once | INTRAMUSCULAR | Status: DC | PRN

## 2012-07-01 MED ORDER — NALBUPHINE HCL 10 MG/ML IJ SOLN: 5.0000 mg | INTRAMUSCULAR | Status: DC | PRN

## 2012-07-01 MED ORDER — FENTANYL CITRATE 0.05 MG/ML IJ SOLN
INTRAMUSCULAR | Status: AC
  Filled 2012-07-01: qty 2

## 2012-07-01 MED ORDER — OXYTOCIN 40 UNITS IN LACTATED RINGERS INFUSION - SIMPLE MED: 1.0000 m[IU]/min | INTRAVENOUS | Status: DC

## 2012-07-01 MED ORDER — MENTHOL 3 MG MT LOZG: 1.0000 | LOZENGE | OROMUCOSAL | Status: DC | PRN

## 2012-07-01 MED ORDER — SCOPOLAMINE 1 MG/3DAYS TD PT72
1.0000 | MEDICATED_PATCH | Freq: Once | TRANSDERMAL | Status: DC
  Administered 2012-07-01: 1.5 mg via TRANSDERMAL

## 2012-07-01 MED ORDER — EPHEDRINE 5 MG/ML INJ
INTRAVENOUS | Status: AC
  Filled 2012-07-01: qty 10

## 2012-07-01 MED ORDER — PROMETHAZINE HCL 25 MG/ML IJ SOLN: 6.2500 mg | INTRAMUSCULAR | Status: DC | PRN

## 2012-07-01 MED ORDER — KETOROLAC TROMETHAMINE 30 MG/ML IJ SOLN
30.0000 mg | Freq: Four times a day (QID) | INTRAMUSCULAR | Status: DC | PRN
  Administered 2012-07-01: 30 mg via INTRAVENOUS

## 2012-07-01 MED ORDER — IBUPROFEN 600 MG PO TABS
600.0000 mg | ORAL_TABLET | Freq: Four times a day (QID) | ORAL | Status: DC
  Administered 2012-07-02 – 2012-07-03 (×6): 600 mg via ORAL
  Filled 2012-07-01 (×6): qty 1

## 2012-07-01 MED ORDER — KETOROLAC TROMETHAMINE 60 MG/2ML IM SOLN: 60.0000 mg | Freq: Once | INTRAMUSCULAR | Status: AC | PRN

## 2012-07-01 MED ORDER — DIPHENHYDRAMINE HCL 25 MG PO CAPS: 25.0000 mg | ORAL_CAPSULE | ORAL | Status: DC | PRN

## 2012-07-01 MED ORDER — GLYBURIDE 5 MG PO TABS
5.0000 mg | ORAL_TABLET | Freq: Every day | ORAL | Status: DC
  Administered 2012-07-01 – 2012-07-02 (×2): 5 mg via ORAL
  Filled 2012-07-01 (×3): qty 1

## 2012-07-01 MED ORDER — LANOLIN HYDROUS EX OINT: 1.0000 "application " | TOPICAL_OINTMENT | CUTANEOUS | Status: DC | PRN

## 2012-07-01 MED ORDER — PRENATAL MULTIVITAMIN CH
1.0000 | ORAL_TABLET | Freq: Every day | ORAL | Status: DC
  Administered 2012-07-02 – 2012-07-03 (×2): 1 via ORAL
  Filled 2012-07-01 (×2): qty 1

## 2012-07-01 MED ORDER — PHENYLEPHRINE HCL 10 MG/ML IJ SOLN
INTRAMUSCULAR | Status: DC | PRN
  Administered 2012-07-01: 40 ug via INTRAVENOUS
  Administered 2012-07-01 (×2): 80 ug via INTRAVENOUS
  Administered 2012-07-01: 40 ug via INTRAVENOUS
  Administered 2012-07-01 (×2): 80 ug via INTRAVENOUS
  Administered 2012-07-01 (×3): 40 ug via INTRAVENOUS

## 2012-07-01 MED ORDER — PHENYLEPHRINE 40 MCG/ML (10ML) SYRINGE FOR IV PUSH (FOR BLOOD PRESSURE SUPPORT)
PREFILLED_SYRINGE | INTRAVENOUS | Status: AC
  Filled 2012-07-01: qty 5

## 2012-07-01 MED ORDER — ONDANSETRON HCL 4 MG/2ML IJ SOLN: 4.0000 mg | Freq: Three times a day (TID) | INTRAMUSCULAR | Status: DC | PRN

## 2012-07-01 MED ORDER — NALBUPHINE SYRINGE 5 MG/0.5 ML
5.0000 mg | INJECTION | INTRAMUSCULAR | Status: DC | PRN
  Administered 2012-07-01: 10 mg via SUBCUTANEOUS
  Filled 2012-07-01: qty 1

## 2012-07-01 SURGICAL SUPPLY — 35 items
CLOTH BEACON ORANGE TIMEOUT ST (SAFETY) ×2 IMPLANT
CONTAINER PREFILL 10% NBF 15ML (MISCELLANEOUS) IMPLANT
DRAPE LG THREE QUARTER DISP (DRAPES) ×2 IMPLANT
DRESSING TELFA 8X3 (GAUZE/BANDAGES/DRESSINGS) IMPLANT
DRSG OPSITE 11X17.75 LRG (GAUZE/BANDAGES/DRESSINGS) ×1 IMPLANT
DRSG OPSITE POSTOP 4X10 (GAUZE/BANDAGES/DRESSINGS) ×1 IMPLANT
DURAPREP 26ML APPLICATOR (WOUND CARE) ×2 IMPLANT
ELECT REM PT RETURN 9FT ADLT (ELECTROSURGICAL) ×2
ELECTRODE REM PT RTRN 9FT ADLT (ELECTROSURGICAL) ×1 IMPLANT
EXTRACTOR VACUUM M CUP 4 TUBE (SUCTIONS) ×1 IMPLANT
GAUZE SPONGE 4X4 12PLY STRL LF (GAUZE/BANDAGES/DRESSINGS) ×2 IMPLANT
GLOVE BIO SURGEON STRL SZ7.5 (GLOVE) ×4 IMPLANT
GOWN PREVENTION PLUS LG XLONG (DISPOSABLE) ×4 IMPLANT
GOWN PREVENTION PLUS XLARGE (GOWN DISPOSABLE) ×2 IMPLANT
KIT ABG SYR 3ML LUER SLIP (SYRINGE) IMPLANT
NDL HYPO 25X1 1.5 SAFETY (NEEDLE) ×1 IMPLANT
NDL HYPO 25X5/8 SAFETYGLIDE (NEEDLE) IMPLANT
NEEDLE HYPO 25X1 1.5 SAFETY (NEEDLE) ×2 IMPLANT
NEEDLE HYPO 25X5/8 SAFETYGLIDE (NEEDLE) IMPLANT
NS IRRIG 1000ML POUR BTL (IV SOLUTION) ×2 IMPLANT
PACK C SECTION WH (CUSTOM PROCEDURE TRAY) ×2 IMPLANT
PAD ABD 7.5X8 STRL (GAUZE/BANDAGES/DRESSINGS) IMPLANT
PAD OB MATERNITY 4.3X12.25 (PERSONAL CARE ITEMS) IMPLANT
SLEEVE SCD COMPRESS KNEE MED (MISCELLANEOUS) IMPLANT
STAPLER VISISTAT 35W (STAPLE) ×2 IMPLANT
SUT MNCRL 0 VIOLET CTX 36 (SUTURE) ×2 IMPLANT
SUT MON AB 2-0 CT1 27 (SUTURE) ×2 IMPLANT
SUT MON AB-0 CT1 36 (SUTURE) ×4 IMPLANT
SUT MONOCRYL 0 CTX 36 (SUTURE) ×2
SUT PLAIN 0 NONE (SUTURE) IMPLANT
SUT PLAIN 2 0 XLH (SUTURE) ×1 IMPLANT
SYR CONTROL 10ML LL (SYRINGE) ×2 IMPLANT
TOWEL OR 17X24 6PK STRL BLUE (TOWEL DISPOSABLE) ×6 IMPLANT
TRAY FOLEY CATH 14FR (SET/KITS/TRAYS/PACK) ×2 IMPLANT
WATER STERILE IRR 1000ML POUR (IV SOLUTION) ×2 IMPLANT

## 2012-07-01 NOTE — Anesthesia Procedure Notes (Signed)
Spinal  Patient location during procedure: OR Start time: 07/01/2012 5:11 PM Staffing Anesthesiologist: Brayton Caves R Performed by: anesthesiologist  Preanesthetic Checklist Completed: patient identified, site marked, surgical consent, pre-op evaluation, timeout performed, IV checked, risks and benefits discussed and monitors and equipment checked Spinal Block Patient position: sitting Prep: DuraPrep Patient monitoring: heart rate, cardiac monitor, continuous pulse ox and blood pressure Approach: midline Location: L3-4 Injection technique: single-shot Needle Needle type: Sprotte  Needle gauge: 24 G Needle length: 9 cm Assessment Sensory level: T4 Additional Notes Patient identified.  Risk benefits discussed including failed block, incomplete pain control, headache, nerve damage, paralysis, blood pressure changes, nausea, vomiting, reactions to medication both toxic or allergic, and postpartum back pain.  Patient expressed understanding and wished to proceed.  All questions were answered.  Sterile technique used throughout procedure.  CSF was clear.  No parasthesia or other complications.  Please see nursing notes for vital signs.

## 2012-07-01 NOTE — Op Note (Signed)
Cesarean Section Procedure Note  Indications: failed induction and previous uterine incision kerr x one  Pre-operative Diagnosis: 39 week 1 day pregnancy.  Post-operative Diagnosis: same  Surgeon: Lenoard Aden   Assistants: Fredric Mare, CNM  Anesthesia: Local anesthesia 0.25.% bupivacaine and Spinal anesthesia  ASA Class: 2  Procedure Details  The patient was seen in the Holding Room. The risks, benefits, complications, treatment options, and expected outcomes were discussed with the patient.  The patient concurred with the proposed plan, giving informed consent. The risks of anesthesia, infection, bleeding and possible injury to other organs discussed. Injury to bowel, bladder, or ureter with possible need for repair discussed. Possible need for transfusion with secondary risks of hepatitis or HIV acquisition discussed. Post operative complications to include but not limited to DVT, PE and Pneumonia noted. The site of surgery properly noted/marked. The patient was taken to Operating Room # 1, identified as Tammy Mooney and the procedure verified as C-Section Delivery. A Time Out was held and the above information confirmed.  After induction of anesthesia, the patient was draped and prepped in the usual sterile manner. A Pfannenstiel incision was made and carried down through the subcutaneous tissue to the fascia. Fascial incision was made and extended transversely using Mayo scissors. The fascia was separated from the underlying rectus tissue superiorly and inferiorly. The peritoneum was identified and entered. Peritoneal incision was extended longitudinally. The utero-vesical peritoneal reflection was incised transversely and the bladder flap was bluntly freed from the lower uterine segment. A low transverse uterine incision(Kerr hysterotomy) was made. Delivered from OA presentation with vacuum assistance x one pull was a  female with Apgar scores of 9 at one minute and 9 at five minutes.  Bulb suctioning gently performed. Neonatal team in attendance.After the umbilical cord was clamped and cut cord blood was obtained for evaluation. The placenta was removed intact and appeared normal. The uterus was curetted with a dry lap pack. Good hemostasis was noted.The uterine outline, tubes and ovaries appeared normal. The uterine incision was closed with running locked sutures of 0 Monocryl x 2 layers. Hemostasis was observed. Lavage was carried out until clear The fascia was then reapproximated with running sutures of 0 Monocryl. 0 plain to close [place] layer.The skin was reapproximated with staples.  Instrument, sponge, and needle counts were correct prior the abdominal closure and at the conclusion of the case.   Findings: above  Estimated Blood Loss:  300 mL         Drains: foley                 Specimens: placenta                 Complications:  None; patient tolerated the procedure well.         Disposition: PACU - hemodynamically stable.         Condition: stable  Attending Attestation: I performed the procedure.

## 2012-07-01 NOTE — Progress Notes (Signed)
S: not feeling any pain with ctx - some tightness is all      Would like Dr Billy Coast for CS if necessary   O:  VS: Blood pressure 133/80, pulse 90, temperature 98 F (36.7 C), temperature source Oral, resp. rate 18, height 5\' 6"  (1.676 m), weight 120.203 kg (265 lb), last menstrual period 10/02/2011.        FHR : baseline 130 / variability moderate / accels + / decels none        Toco: contractions every 2-4 minutes / mild / MVU 100-120        Cervix : deferred        Membranes: clear fluid leakage  A: Latent labor     FHR category 1     GDM-A2     TOLAC     + GBS on ABX prophylaxis  P: Increase pitocin to achieve MVU 150-250      Epidural prn pain      Plan repeat CS if no cervical progression with adequate labor and time    [name] CNM, MSN 07/01/2012, 11:40 AM

## 2012-07-01 NOTE — Progress Notes (Signed)
Pt desires to stop with TOLAC and proceed with C/S.  Wiliam Ke CNM notified to come evaluate.

## 2012-07-01 NOTE — Transfer of Care (Signed)
Immediate Anesthesia Transfer of Care Note  Patient: Tammy Mooney  Procedure(s) Performed: Procedure(s) (LRB) with comments: CESAREAN SECTION (N/A) - Repeat cesarean section with delivery of baby girl at 35. Apgars 9/9.  Patient Location: PACU  Anesthesia Type:Spinal  Level of Consciousness: awake, alert , oriented and patient cooperative  Airway & Oxygen Therapy: Patient Spontanous Breathing  Post-op Assessment: Report given to PACU RN and Post -op Vital signs reviewed and stable  Post vital signs: Reviewed and stable  Complications: No apparent anesthesia complications

## 2012-07-01 NOTE — Progress Notes (Signed)
Patient ID: Tammy Mooney, female   DOB: 07-Jun-1981, 31 y.o.   MRN: [id number]  Called by nursing staff to examine patient - wants midwife to examine cervix - if no change then she wants a c-section   S:       frustrated - wants to have a c-section       she and spouse feel like it has taken too long - " it is almost 24 hours of labor"       no pain with ctx  O:  VS: Blood pressure 130/70, pulse 85, temperature 98.2 F (36.8 C), temperature source Oral, resp. rate 18, height 5\' 6"  (1.676 m), weight 120.203 kg (265 lb), last menstrual period 10/02/2011.        FHR : baseline 135 / variability moderate / accels + / decels none        Toco: contractions every 2-4 minutes / mild ctx / MVU not adequate under 150        Cervix : 3-4cm / 80% / vtx -2 with small caput        Membranes: clear fluid leakage  A: Latent labor     FHR category 1  P:  Discussed with patient and spouse - not in active labor yet which is why cervix has not changed. Her arrival to the hospital did not "start" her labor clock. The time of ROM was the start of labor - she is still in latent labor which may take 8-10 hours. Average first labor 16 hours. Anticipate a long labor because she has never labored before / previous CS / baby that remains high in pelvis at this point. There is no prediction for how long she may be in labor or whether she will or will not progress to a vaginal delivery. Patient's plan all along was to trial labor and if nothing happened to proceed to CS - did not want hours of labor without progress to cause exhaustion prior to surgery.   At this time - patient desires cessation of induction with progression to cesarean delivery. Discussion of cesarean section - risks and benefits reviewed / possible adhesions in pelvis from previous CS.  Preop for C-Section non-urgent Dr Billy Coast contacted  - post CS at 4:30 MD will call OR to schedule / preop orders initiated   [name] CNM, MSN 07/01/2012,  2:32 PM

## 2012-07-01 NOTE — Addendum Note (Signed)
Addendum  created 07/01/12 2115 by Tyrone Apple. Malen Gauze, MD   Modules edited:Orders, PRL Based Order Sets

## 2012-07-01 NOTE — Anesthesia Postprocedure Evaluation (Signed)
Anesthesia Post Note  Patient: Tammy Mooney  Procedure(s) Performed: Procedure(s) (LRB): CESAREAN SECTION (N/A)  Anesthesia type: Spinal  Patient location: PACU  Post pain: Pain level controlled  Post assessment: Post-op Vital signs reviewed  Last Vitals:  Filed Vitals:   07/01/12 1640  BP: 125/83  Pulse: 93  Temp: 36.8 C  Resp: 18    Post vital signs: Reviewed  Level of consciousness: awake  Complications: No apparent anesthesia complications

## 2012-07-01 NOTE — Progress Notes (Addendum)
Foley bulb fall out at 23.40 hrs.  SVE at time 3/80%/-3, anterior, soft, Vx. Patient had one dose of Fentanyl and was resting. Requested a few hours rest prior to AROM. Agreed AROM approx. 4.00 hrs.  O VSS Filed Vitals:   07/01/12 0503  BP: 137/79  Pulse: 90  Temp: 97.8 F (36.6 C)  Resp: 20   Blood glucose range: 90 - 130. Blood Glucose monitoring Q. 2hrly while in labor.  fhts category 1, baseline 155 bpm  abd soft between uc 1: 3 - 4 mins   At 4.15 hrs, AROM, Clear with blood tinge, IUPC and FSE placed  S/p AROM Contraction pattern has increased to 1: 2 - 3 mins SVE: 3 -4 /80%, soft, anterior, Vx well applied to Cx. Cx more favorable now. A/P Continue to monitor EFM and B/P and Blood Glucose. Will re evaluate at 7 - 7.30 hrs to see if Cx change and need        for introduction of Pitocin to advance labor curve.   Earl Gala, CNM.

## 2012-07-01 NOTE — Progress Notes (Signed)
Comfortable, Continues to contract 1: 2 - 3 mins - evaluation of progress in labor. O VSS       fhts category 1 baseline 155 bpm      abd soft between uc      Contractions 1: 2 - 3 mins      Vag: 3-4/ 70- 80% -2, anterior - no change A   Augmentation with Pitocin of 2 milliunits/min      EFM continuous and continue with B/P surveillance and Blood glucose monitoring P continue care    Possible RLTCS if IOL fails  Earl Gala, CNM.

## 2012-07-01 NOTE — Anesthesia Preprocedure Evaluation (Signed)
Anesthesia Evaluation  Patient identified by MRN, date of birth, ID band Patient awake    Reviewed: Allergy & Precautions, H&P , NPO status , Patient's Chart, lab work & pertinent test results  Airway Mallampati: III      Dental No notable dental hx.    Pulmonary neg pulmonary ROS,  breath sounds clear to auscultation  Pulmonary exam normal       Cardiovascular Exercise Tolerance: Good negative cardio ROS  Rhythm:regular Rate:Normal     Neuro/Psych negative neurological ROS  negative psych ROS   GI/Hepatic negative GI ROS, Neg liver ROS,   Endo/Other  negative endocrine ROSdiabetesMorbid obesity  Renal/GU negative Renal ROS  negative genitourinary   Musculoskeletal   Abdominal Normal abdominal exam  (+)   Peds  Hematology negative hematology ROS (+)   Anesthesia Other Findings  H/O varicella     PCOS (polycystic ovarian syndrome)        Infections of genitourinary tract in pregnancy, unspecified as to episode of care(646.60)     Diabetes mellitus without complication   glyburide    Reproductive/Obstetrics (+) Pregnancy                           Anesthesia Physical Anesthesia Plan  ASA: III  Anesthesia Plan: Spinal   Post-op Pain Management:    Induction:   Airway Management Planned:   Additional Equipment:   Intra-op Plan:   Post-operative Plan:   Informed Consent: I have reviewed the patients History and Physical, chart, labs and discussed the procedure including the risks, benefits and alternatives for the proposed anesthesia with the patient or authorized representative who has indicated his/her understanding and acceptance.     Plan Discussed with: Anesthesiologist, CRNA and Surgeon  Anesthesia Plan Comments:         Anesthesia Quick Evaluation

## 2012-07-01 NOTE — OR Nursing (Signed)
Uterus massaged by S. Johnchristopher Sarvis RN. Two tubes of cord blood sent to lab.  20cc of blood evacuated from uterus during uterine massage. 

## 2012-07-01 NOTE — Addendum Note (Signed)
Addendum  created 07/01/12 2115 by Jnyah Brazee A. Garold Sheeler, MD   Modules edited:Orders, PRL Based Order Sets    

## 2012-07-01 NOTE — Progress Notes (Signed)
Called Davies cnm, informed of unable to reach cervix, she will come in to evaluate pt, pt may come off monitor until then.

## 2012-07-02 ENCOUNTER — Encounter (HOSPITAL_COMMUNITY): Payer: Self-pay | Admitting: Obstetrics and Gynecology

## 2012-07-02 ENCOUNTER — Encounter (HOSPITAL_COMMUNITY): Payer: Self-pay

## 2012-07-02 LAB — CBC
HCT: 35.4 % — ABNORMAL LOW (ref 36.0–46.0)
Hemoglobin: 11.6 g/dL — ABNORMAL LOW (ref 12.0–15.0)
RBC: 4.37 MIL/uL (ref 3.87–5.11)
RDW: 15.4 % (ref 11.5–15.5)
WBC: 11.9 10*3/uL — ABNORMAL HIGH (ref 4.0–10.5)

## 2012-07-02 LAB — GLUCOSE, CAPILLARY
Glucose-Capillary: 91 mg/dL (ref 70–99)
Glucose-Capillary: 157 mg/dL — ABNORMAL HIGH (ref 70–99)

## 2012-07-02 NOTE — Anesthesia Postprocedure Evaluation (Signed)
  Anesthesia Post-op Note  Patient: Tammy Mooney  Procedure(s) Performed: Procedure(s) (LRB) with comments: CESAREAN SECTION (N/A) - Repeat cesarean section with delivery of baby girl at 53. Apgars 9/9.  Patient Location: PACU and Mother/Baby  Anesthesia Type:Regional  Level of Consciousness: awake, alert  and patient cooperative  Airway and Oxygen Therapy: Patient Spontanous Breathing  Post-op Pain: none  Post-op Assessment: Post-op Vital signs reviewed  Post-op Vital Signs: Reviewed and stable  Complications: No apparent anesthesia complications

## 2012-07-02 NOTE — Progress Notes (Signed)
Patient ID: Tammy Mooney, female   DOB: [birth year], 31 y.o.   MRN: [id number] POD # 1  Subjective: Pt reports feeling well, tired/ Pain controlled with ibuprofen Tolerating po/ Foley patent/ No n/v/Flatus neg Activity: out of bed and ambulate Bleeding is light Newborn info:  Information for the patient's newborn:  Jaeliana, Lococo Girl Ziana [[id number]]  female Feeding: breast   Objective: VS: Blood pressure 96/65, pulse 84, temperature 98 F (36.7 C), temperature source Oral, resp. rate 20.    Intake/Output Summary (Last 24 hours) at 07/02/12 0924 Last data filed at 07/02/12 0630  Gross per 24 hour  Intake   4460 ml  Output   2150 ml  Net   2310 ml      Basename 07/02/12 0555 06/30/12 2015  WBC 11.9* 11.7*  HGB 11.6* 13.3  HCT 35.4* 39.3  PLT 294 321    Blood type: --/--/A POS, A POS (12/11 2015) Rubella: Immune (05/24 0000)    Physical Exam:  General: alert, cooperative and no distress CV: Regular rate and rhythm Resp: clear Abdomen: soft, NT, hypoactive BS x 4 quads Incision: covered with honey comb dressing Uterine Fundus: firm, below umbilicus, nontender Lochia: minimal Ext: edema +1 to +2 pedal and pretib edema and Homans sign is negative, no sign of DVT    A/P: POD # 1/ G2 P1102 S/P Repeat C/Section d/t failed TOLAC Hx PCOS and GDM A2; remains on glyburide Will schedule staple removal in the office and have office call with appt details. Doing well Continue routine post op orders   Signed: Demetrius Revel, MSN, [facility] 07/02/2012, 9:24 AM

## 2012-07-02 NOTE — Progress Notes (Signed)
Inpatient Diabetes Program Recommendations  AACE/ADA: New Consensus Statement on Inpatient Glycemic Control (2013)  Target Ranges:  Prepandial:   less than 140 mg/dL      Peak postprandial:   less than 180 mg/dL (1-2 hours)      Critically ill patients:  140 - 180 mg/dL   Reason for Visit: Consult  Note: Patient noted to have GDM and had C/S on 12/12 @ 17:38 pm.  Would recommend checking blood glucose ACHS and discontinuing Glyburide for now and see how blood glucose trends without the Glyburide.  Called and talked with the patient and she states that her last A1C was 5.8% and she was previously on Metformin due to polycystic ovarian dx and was transitioned to Glyburide about 3-4 weeks ago.  Patient states that she will be in the hospital at least until tomorrow and perhaps until Sunday.  Therefore, please check blood glucose ACHS to determine how blood glucose trends post delivery.  Patient states that she has a PCP and will be following up with PCP and OBGYN after discharge.  Please contact Inpatient Diabetes office if additional questions arise.  Thanks, Orlando Penner, RN, BSN, CCRN Diabetes Coordinator Inpatient Diabetes Program [phone] - pager

## 2012-07-03 LAB — GLUCOSE, CAPILLARY: Glucose-Capillary: 95 mg/dL (ref 70–99)

## 2012-07-03 MED ORDER — OXYCODONE-ACETAMINOPHEN 5-325 MG PO TABS: 1.0000 | ORAL_TABLET | ORAL | Status: DC | PRN

## 2012-07-03 MED ORDER — IBUPROFEN 600 MG PO TABS: 600.0000 mg | ORAL_TABLET | Freq: Four times a day (QID) | ORAL | Status: DC

## 2012-07-03 NOTE — Discharge Summary (Signed)
POSTOPERATIVE DISCHARGE SUMMARY:  Patient ID: Tammy Mooney MRN: [id number] DOB/AGE: [birth year] 31 y.o.  Admit date: 06/30/2012 Discharge date:  07/03/2012   Admission Diagnoses: 1. Term pregnancy with history of c-section for trial of labor 2. Gestational diabetes A2   Discharge Diagnoses:  1. Term Pregnancy-delivered 2. Status post repeat c-section, failed trial of labor  Prenatal history: [id number]   EDC : 07/07/2012, by Other Basis  Prenatal care at [facility] since 1st trimester  Prenatal course complicated by gestational diabetes class A2, previous cesarean section, obesity  Prenatal Labs: ABO, Rh: A/Positive/-- (05/24 0000)  Antibody: Negative (05/24 0000)  Rubella: immune  RPR: Nonreactive (05/24 0000)  HBsAg: Negative (05/24 0000)  HIV: Non-reactive (05/24 0000)  GBS: Positive (05/24 0000)  Sickle cell/Hgb electrophoresis: not completed  Pap: normal  GC: Negative  Chlamydia: Negative  Genetic screenings: Quad - normal  Glucola: Abnormal GDM - Glyburide and diet control.  Medical / Surgical History :  Past medical history:  Past Medical History  Diagnosis Date  . H/O varicella   . PCOS (polycystic ovarian syndrome)   . Infections of genitourinary tract in pregnancy, unspecified as to episode of care(646.60)   . Diabetes mellitus without complication     glyburide    Past surgical history:  Past Surgical History  Procedure Date  . Cesarean section   . Tonsillectomy   . Eye surgery     lasic  . Nasal sinus surgery   . Cesarean section 07/01/2012    Procedure: CESAREAN SECTION;  Surgeon: Lenoard Aden, MD;  Location: [facility];  Service: Obstetrics;  Laterality: N/A;  Repeat cesarean section with delivery of baby girl at 3. Apgars 9/9.    Family History:  Family History  Problem Relation Age of Onset  . Diabetes Mother   . Heart attack Father   . Heart disease Maternal Grandfather   . Diabetes Paternal Grandmother      Social History:  reports that she has never smoked. She has never used smokeless tobacco. She reports that she does not drink alcohol or use illicit drugs.   Allergies: Penicillins    Current Medications at time of admission:  Prescriptions prior to admission  Medication Sig Dispense Refill  . glyBURIDE (DIABETA) 5 MG tablet Take 5 mg by mouth at bedtime.      . Magnesium 250 MG TABS Take 1 tablet by mouth daily.      . Prenatal Vit-Fe Sulfate-FA (PRENATAL VITAMIN PO) Take 1 tablet by mouth at bedtime.       . [DISCONTINUED] metFORMIN (GLUCOPHAGE) 1000 MG tablet Take 1,000 mg by mouth 1 day or 1 dose.            Intrapartum Course: Patient was admitted overnight for induction of labor with cervical balloon and artificial rupture of membranes once balloon expelled. She progressed to 3-4 cm dilation, however fetus vertex remained high station despite several hours of labor, patient declined continuing induction of labor with Pitocin and requested repeat cesarean section. After adequate counseling regarding risks of surgery, patient agreed and was transferred to operating room for procedure.   Procedures: Cesarean section delivery of female newborn by Dr Billy Coast  See operative report for further details  Postoperative / postpartum course: uneventful. Patient remains on Glyburide and reduced carb diet until evaluation at 1 week postpartum.  Physical Exam:  VSS: Blood pressure 99/61, pulse 88, temperature 97.8 F (36.6 C), temperature source Oral, resp. rate 18, height 5\' 6"  (1.676  m), weight 120.203 kg (265 lb), last menstrual period 10/02/2011, SpO2 98.00%, unknown if currently breastfeeding.   LABS:  Basename 07/02/12 0555 06/30/12 2015  WBC 11.9* 11.7*  HGB 11.6* 13.3  HCT 35.4* 39.3  PLT 294 321    I&O: I/O last 3 completed shifts: In: 2680 [P.O.:1680; I.V.:1000] Out: 2725 [Urine:2725]      Incision:  approximated with staples / no erythema / no ecchymosis / no  drainage Staples for removal in office one week post-op  Discharge Instructions:  Discharged Condition: good Activity: pelvic rest, weight lifting and driving restrictions x 2 weeks Diet: routine Medications:    Medication List     As of 07/03/2012 11:45 AM    STOP taking these medications         metFORMIN 1000 MG tablet   Commonly known as: GLUCOPHAGE      TAKE these medications         glyBURIDE 5 MG tablet   Commonly known as: DIABETA   Take 5 mg by mouth at bedtime.      ibuprofen 600 MG tablet   Commonly known as: ADVIL,MOTRIN   Take 1 tablet (600 mg total) by mouth every 6 (six) hours.      Magnesium 250 MG Tabs   Take 1 tablet by mouth daily.      oxyCODONE-acetaminophen 5-325 MG per tablet   Commonly known as: PERCOCET/ROXICET   Take 1-2 tablets by mouth every 4 (four) hours as needed (moderate - severe pain).      PRENATAL VITAMIN PO   Take 1 tablet by mouth at bedtime.       Condition: stable Postpartum Instructions: refer to practice specific booklet Discharge to: home Disposition: discharged home Follow up :      Follow-up Information    Follow up with [name], CNM. Schedule an appointment as soon as possible for a visit in 6 weeks.   Contact information:   [address] [place] [address] [phone]       Follow up with [facility] Schedule an appointment as soon as possible for a visit in 5 days. (for staple removal)    Contact information:   [address] [place] [address] [phone]          Signed: Arlan Organ 07/03/2012, 11:45 AM

## 2012-07-03 NOTE — Progress Notes (Signed)
Subjective: POD# 2 Information for the patient's newborn:  Tammy Mooney, Tammy Mooney [[id number]]  female   Reports feeling well, desires early DC. Feeding: breast Patient reports tolerating PO.  Breast symptoms: no concerns. Pain controlled with Motrin and Percocet. Denies HA/SOB/C/P/N/V/dizziness. Flatus present, + BM.Marland Kitchen She reports vaginal bleeding as normal, without clots.  She is ambulating, urinating without difficult.     Objective:   VS:  Filed Vitals:   07/02/12 1305 07/02/12 1628 07/02/12 2221 07/03/12 0625  BP: 110/60 108/66 93/59 99/61   Pulse: 92 94 96 88  Temp: 97.6 F (36.4 C) 98 F (36.7 C) 97.7 F (36.5 C) 97.8 F (36.6 C)  TempSrc: Oral Oral Oral Oral  Resp: 20 20 20 18   Height:      Weight:      SpO2: 98%        Intake/Output Summary (Last 24 hours) at 07/03/12 1126 Last data filed at 07/02/12 1651  Gross per 24 hour  Intake    240 ml  Output    800 ml  Net   -560 ml        Basename 07/02/12 0555 06/30/12 2015  WBC 11.9* 11.7*  HGB 11.6* 13.3  HCT 35.4* 39.3  PLT 294 321   CBG (last 3)   Basename 07/03/12 0642 07/02/12 2213 07/02/12 0728  GLUCAP 95 157* 91      Blood type: --/--/A POS, A POS (12/11 2015)  Rubella: Immune (05/24 0000)   TDaP last in 2011, flu vaccine up to date   Physical Exam:  General: alert, cooperative and no distress CV: Regular rate and rhythm Resp: clear Abdomen: soft, nontender, normal bowel sounds Incision: clean, dry, intact and opsite and staples intact for removal 1 week post-op Uterine Fundus: firm, below umbilicus, nontender Lochia: minimal Ext: edema trace and Homans sign is negative, no sign of DVT      Assessment/Plan: 31 y.o.   POD# 2. [id number]                  Active Problems:  Gestational diabetes mellitus, class A2  Postpartum care following cesarean delivery (12/12)  Doing well, stable.    GDM A2 continues on Glyburide 5 mg PO at HS, will reevaluate BS at 1 week post-op visit Routine  post-op care DC home w/ WOB instructions Staples for removal in office 1 week post-op  PAUL,DANIELA 07/03/2012, 11:26 AM

## 2012-07-03 NOTE — Discharge Instructions (Signed)
Breast Pumping Tips °Pumping your breast milk is a good way to stimulate milk production and have a steady supply of breast milk for your infant. Pumping is most helpful during your infant's growth spurts, when involving dad or a family member, or when you are away. There are several types of pumps available. They can be purchased at a baby or maternity store. You can begin pumping soon after delivery, but some experts believe that you should wait about four weeks to give your infant a bottle. °In general, the more you breastfeed or pump, the more milk you will have for your infant. It is also important to take good care of yourself. This will reduce stress and help your body to create a healthy supply of milk. Your caregiver or lactation consultant can give you the information and support you need in your efforts to breastfeed your infant. °PUMPING BREAST MILK  °Follow the tips below for successful breast pumping. °Take care of yourself. °· Drink enough water or fluids to keep urine clear or pale yellow. You may notice a thirsty feeling while breastfeeding. This is because your body needs more water to make breast milk. Keep a large water bottle handy. Make healthy drink choices such as unsweetened fruit juice, milk and water. Limit soda, coffee, and alcohol (wait 2 hours to feed or pump if you have an alcoholic drink.) °· Eat a healthy, well-balanced diet rich in fruits, vegetables, and whole grains. °· Exercise as recommended by your caregiver. °· Get plenty of sleep. Sleep when your infant sleeps. Ask friends and family for help if you need time to nap or rest. °· Do not smoke. Smoking can lower your milk supply and harm your infant. If you need help quitting, ask your caregiver for a program recommendation. °· Ask your caregiver about birth control options. Birth control pills may lower your milk supply. You may be advised to use condoms or other forms of birth control. °Relax and pump °Stimulating your  let-down reflex is the key to successful and effective pumping. This makes the milk in all parts of the breast flow more freely.  °· It is easier to pump breast milk (and breastfeed) while you are relaxed. Find techniques that work for you. Quiet private spaces, breast massage, soothing heat placed on the breast, music, and pictures or a tape recording of your infant may help you to relax and "let down" your milk. If you have difficulty with your let down, try smelling one of your infant's blankets or an item of clothing he or she has worn while you are pumping. °· When pumping, place the special suction cup (flange) directly over the nipple. It may be uncomfortable and cause nipple damage if it is not placed properly or is the wrong size. Applying a small amount of purified or modified lanolin to your nipple and the areola may help increase your comfort level. Also, you can change the speed and suction of many electric pumps to your comfort level. Your caregiver or lactation consultant can help you with this. °· If pumping continues to be painful, or you feel you are not getting very much milk when you pump, you may need a different type of pump. A lactation consultant can help you determine if this is the case. °· If you are with your infant, feed him or her on demand and try pumping after each feeding. This will boost your production, even if milk does not come out. You may not be able   to pump much milk at first, but keep up the routine, and this will change.  If you are working or away from your infant for several hours, try pumping for about 15 minutes every 2 to 3 hours. Pump both breasts at the same time if you can.  If your infant has a formula feeding, make sure you pump your milk around the same time to maintain your supply.  Begin pumping breast milk a few weeks before you return to work. This will help you develop techniques that work for you and will be able to store extra milk.  Find a source  of breastfeeding information that works well for you. TIPS FOR STORING BREAST MILK  Store breast milk in a sealable sterile bag, jar, or container provided with your pumping supplies.  Store milk in small amounts close to what your infant is drinking at each feeding.  Cool pumped milk in a refrigerator or cooler. Pumped milk can last at the back of the refrigerator for 3 to 8 days.  Place cooled milk at the back of the freezer for up to 3 months.  Thaw the milk in its container or bag in warm water up to 24 hours in advance. Do not use a microwave to thaw or heat milk. Do not refreeze the milk after it has been thawed.  Breast milk is safe to drink when left at room temperature (mid 70s or colder) for 4 to 8 hours. After that, throw it away.  Milk fat can separate and look funny. The color can vary slightly from day to day. This is normal. Always shake the milk before using it to mix the fat with the more watery portion. SEEK MEDICAL CARE IF:   You are having trouble pumping or feeding your infant.  You are concerned that you are not making enough milk.  You have nipple pain, soreness, or redness.  You have other questions or concerns related to you or your infant. Document Released: 12/25/2009 Document Revised: 09/29/2011 Document Reviewed: 12/25/2009 [facility] Patient Information 2013 [place], [place].

## 2012-07-04 LAB — TYPE AND SCREEN
ABO/RH(D): A POS
Unit division: 0

## 2012-07-08 ENCOUNTER — Inpatient Hospital Stay (HOSPITAL_COMMUNITY): Admission: AD | Admit: 2012-07-08 | Payer: Self-pay | Source: Ambulatory Visit | Admitting: Family Medicine

## 2012-07-12 ENCOUNTER — Encounter (HOSPITAL_COMMUNITY): Payer: Self-pay | Admitting: Emergency Medicine

## 2012-07-12 ENCOUNTER — Emergency Department (HOSPITAL_COMMUNITY): Payer: 59

## 2012-07-12 ENCOUNTER — Emergency Department (HOSPITAL_COMMUNITY)
Admission: EM | Admit: 2012-07-12 | Discharge: 2012-07-12 | Disposition: A | Discharge status: Home / Self Care | Payer: 59 | Attending: Emergency Medicine | Admitting: Emergency Medicine

## 2012-07-12 DIAGNOSIS — Y9389 Activity, other specified: Secondary | ICD-10-CM | POA: Insufficient documentation

## 2012-07-12 DIAGNOSIS — Z79899 Other long term (current) drug therapy: Secondary | ICD-10-CM | POA: Insufficient documentation

## 2012-07-12 DIAGNOSIS — X500XXA Overexertion from strenuous movement or load, initial encounter: Secondary | ICD-10-CM | POA: Insufficient documentation

## 2012-07-12 DIAGNOSIS — S298XXA Other specified injuries of thorax, initial encounter: Secondary | ICD-10-CM | POA: Insufficient documentation

## 2012-07-12 DIAGNOSIS — Y929 Unspecified place or not applicable: Secondary | ICD-10-CM | POA: Insufficient documentation

## 2012-07-12 DIAGNOSIS — R0789 Other chest pain: Secondary | ICD-10-CM | POA: Insufficient documentation

## 2012-07-12 DIAGNOSIS — Z8742 Personal history of other diseases of the female genital tract: Secondary | ICD-10-CM | POA: Insufficient documentation

## 2012-07-12 DIAGNOSIS — E119 Type 2 diabetes mellitus without complications: Secondary | ICD-10-CM | POA: Insufficient documentation

## 2012-07-12 DIAGNOSIS — Z8619 Personal history of other infectious and parasitic diseases: Secondary | ICD-10-CM | POA: Insufficient documentation

## 2012-07-12 MED ORDER — OXYCODONE-ACETAMINOPHEN 5-325 MG PO TABS
2.0000 | ORAL_TABLET | Freq: Once | ORAL | Status: AC
  Administered 2012-07-12: 2 via ORAL
  Filled 2012-07-12: qty 2

## 2012-07-12 NOTE — ED Notes (Addendum)
Pt c/o pain on rt side of chest. Noticed a sudden pain when she reached over to grab something. Has had pain since 1900 today. She states she took Percocet at home and did not help. States that she has noticed more shallow breathes. Difficulty when she takes a deep breath.

## 2012-07-12 NOTE — ED Provider Notes (Signed)
Medical screening examination/treatment/procedure(s) were performed by non-physician practitioner and as supervising physician I was immediately available for consultation/collaboration.  Siarah Deleo M Alexandrea Westergard, MD 07/12/12 0403 

## 2012-07-12 NOTE — ED Provider Notes (Signed)
History     CSN: [id number]  Arrival date & time 07/12/12  0020   First MD Initiated Contact with Patient 07/12/12 0049      Chief Complaint  Patient presents with  . Shortness of Breath   HPI  History provided by the patient. Patient is a 31 year old female with recent history of C-section delivery on December 12 who presents with complaints of acute onset right rib and lateral chest wall pain. Patient states she was breast-feeding her child when her young 85-year-old was running by and she reached for something to her right side. She reports feeling a pop and acute pain to her right lateral rib area. Pain has been persistent and is worse with deep breaths or certain positions especially lying on the side. Symptoms are also associated with some shortness of breath sensation. Patient did take some ibuprofen for pain without significant improvement. Patient also took one Percocet pill which she had following her C-section. This also did not help significantly with pains. She denies any other aggravating or alleviating factors. Denies any other associated symptoms.    Past Medical History  Diagnosis Date  . H/O varicella   . PCOS (polycystic ovarian syndrome)   . Infections of genitourinary tract in pregnancy, unspecified as to episode of care(646.60)   . Diabetes mellitus without complication     glyburide    Past Surgical History  Procedure Date  . Cesarean section   . Tonsillectomy   . Eye surgery     lasic  . Nasal sinus surgery   . Cesarean section 07/01/2012    Procedure: CESAREAN SECTION;  Surgeon: Lenoard Aden, MD;  Location: [facility];  Service: Obstetrics;  Laterality: N/A;  Repeat cesarean section with delivery of baby girl at 76. Apgars 9/9.    Family History  Problem Relation Age of Onset  . Diabetes Mother   . Heart attack Father   . Heart disease Maternal Grandfather   . Diabetes Paternal Grandmother     History  Substance Use Topics  . Smoking status:  Never Smoker   . Smokeless tobacco: Never Used  . Alcohol Use: No    OB History    Grav Para Term Preterm Abortions TAB SAB Ect Mult Living   3 2 1 1      2       Review of Systems  Constitutional: Negative for fever, chills and diaphoresis.  Respiratory: Positive for shortness of breath. Negative for cough.   Cardiovascular: Positive for chest pain. Negative for palpitations and leg swelling.  All other systems reviewed and are negative.    Allergies  Penicillins  Home Medications   Current Outpatient Rx  Name  Route  Sig  Dispense  Refill  . ASCORBIC ACID 500 MG PO TABS   Oral   Take 2,000 mg by mouth daily.         . GLYBURIDE 5 MG PO TABS   Oral   Take 5 mg by mouth at bedtime.         . IBUPROFEN 600 MG PO TABS   Oral   Take 1 tablet (600 mg total) by mouth every 6 (six) hours.   60 tablet   0   . MAGNESIUM 250 MG PO TABS   Oral   Take 1 tablet by mouth daily.         . OXYCODONE-ACETAMINOPHEN 5-325 MG PO TABS   Oral   Take 1-2 tablets by mouth every 4 (four) hours as  needed (moderate - severe pain).   30 tablet   0   . PRENATAL VITAMIN PO   Oral   Take 1 tablet by mouth at bedtime.          Marland Kitchen ZINC SULFATE 220 MG PO CAPS   Oral   Take 220 mg by mouth daily. OTC           BP 147/91  Pulse 101  Temp 98.9 F (37.2 C) (Oral)  Resp 26  SpO2 100%  LMP 10/02/2011  Breastfeeding? Yes  Physical Exam  Nursing note and vitals reviewed. Constitutional: She is oriented to person, place, and time. She appears well-developed and well-nourished. No distress.  HENT:  Head: Normocephalic.  Cardiovascular: Normal rate and regular rhythm.   No murmur heard. Pulmonary/Chest: Effort normal and breath sounds normal. No respiratory distress. She has no wheezes. She has no rales. She exhibits tenderness and bony tenderness. She exhibits no mass, no crepitus and no deformity.    Abdominal: Soft.  Neurological: She is alert and oriented to person,  place, and time.  Skin: Skin is warm and dry. No rash noted.  Psychiatric: She has a normal mood and affect. Her behavior is normal.    ED Course  Procedures   Dg Ribs Unilateral W/chest Right  07/12/2012  *RADIOLOGY REPORT*  Clinical Data: Right lower rib pain and shortness of breath; pulled muscle on the right side of the chest.  RIGHT RIBS AND CHEST - 3+ VIEW  Comparison: None.  Findings: No displaced rib fractures are seen.  The lungs are well-aerated.  Minimal left basilar atelectasis is noted.  There is no evidence of pleural effusion or pneumothorax.  The cardiomediastinal silhouette is within normal limits.  No acute osseous abnormalities are seen.  IMPRESSION:  1.  No displaced rib fractures seen. 2.  Minimal left basilar atelectasis noted; lungs otherwise clear.   Original Report Authenticated By: Tonia Ghent, M.D.      1. Musculoskeletal chest pain       MDM  12:45AM patient seen and evaluated. Patient sitting up appears in no acute distress. Normal O2 sats. There is slight tachypnea.   Symptoms were acute in onset after stretching and bending with subjective "popping" sensation in chest wall. X-rays unremarkable. Pain is very reproducible to palpation over lateral rib wall. Doubt PE. This time patient instructed on conservative management and close outpatient followup.     Angus Seller, [place] 07/12/12 [phone]

## 2012-07-12 NOTE — ED Notes (Signed)
Pt states she was breastfeeding her baby and reached to get something and felt a pop in her right rib area  Pt states the pain has progressively gotten worse making it hard to breathe  Pt states she is taking percocet for her c section pain last dose around 6pm  Pt states she had a c section on Dec 12th   Pt states she took ibuprofen 600mg  around 2300

## 2012-09-04 ENCOUNTER — Other Ambulatory Visit: Payer: Self-pay

## 2013-05-20 ENCOUNTER — Ambulatory Visit (INDEPENDENT_AMBULATORY_CARE_PROVIDER_SITE_OTHER): Payer: 59 | Admitting: Family

## 2013-05-20 ENCOUNTER — Encounter: Payer: Self-pay | Admitting: Family

## 2013-05-20 VITALS — BP 98/62 | HR 66 | Ht 66.0 in | Wt 256.0 lb

## 2013-05-20 DIAGNOSIS — J309 Allergic rhinitis, unspecified: Secondary | ICD-10-CM

## 2013-05-20 DIAGNOSIS — E282 Polycystic ovarian syndrome: Secondary | ICD-10-CM

## 2013-05-20 DIAGNOSIS — R7309 Other abnormal glucose: Secondary | ICD-10-CM

## 2013-05-20 DIAGNOSIS — E669 Obesity, unspecified: Secondary | ICD-10-CM

## 2013-05-20 DIAGNOSIS — Z0000 Encounter for general adult medical examination without abnormal findings: Secondary | ICD-10-CM

## 2013-05-20 LAB — CBC WITH DIFFERENTIAL/PLATELET
Basophils Absolute: 0 10*3/uL (ref 0.0–0.1)
Eosinophils Absolute: 0.1 10*3/uL (ref 0.0–0.7)
Lymphocytes Relative: 23.4 % (ref 12.0–46.0)
MCHC: 34 g/dL (ref 30.0–36.0)
Monocytes Relative: 5.4 % (ref 3.0–12.0)
Neutrophils Relative %: 69.2 % (ref 43.0–77.0)
RDW: 13.9 % (ref 11.5–14.6)

## 2013-05-20 LAB — LIPID PANEL
Cholesterol: 179 mg/dL (ref 0–200)
LDL Cholesterol: 121 mg/dL — ABNORMAL HIGH (ref 0–99)
Total CHOL/HDL Ratio: 5
Triglycerides: 117 mg/dL (ref 0.0–149.0)
VLDL: 23.4 mg/dL (ref 0.0–40.0)

## 2013-05-20 LAB — COMPREHENSIVE METABOLIC PANEL
AST: 24 U/L (ref 0–37)
Albumin: 4 g/dL (ref 3.5–5.2)
Alkaline Phosphatase: 68 U/L (ref 39–117)
BUN: 14 mg/dL (ref 6–23)
Glucose, Bld: 85 mg/dL (ref 70–99)
Potassium: 3.5 mEq/L (ref 3.5–5.1)
Sodium: 137 mEq/L (ref 135–145)
Total Bilirubin: 0.6 mg/dL (ref 0.3–1.2)
Total Protein: 7.4 g/dL (ref 6.0–8.3)

## 2013-05-20 LAB — TSH: TSH: 3.58 u[IU]/mL (ref 0.35–5.50)

## 2013-05-20 MED ORDER — FLUTICASONE PROPIONATE 50 MCG/ACT NA SUSP: 2.0000 | Freq: Every day | NASAL | Status: DC

## 2013-05-20 NOTE — Patient Instructions (Signed)

## 2013-05-20 NOTE — Progress Notes (Signed)
Subjective:    Patient ID: Tammy Mooney, female    DOB: [birth year], 32 y.o.   MRN: [id number]  HPI  32 year old WF, new patient to the practice is in to be established and for a routine physical examination for this healthy  Female. Reviewed all health maintenance protocols including mammography colonoscopy bone density and reviewed appropriate screening labs. Her immunization history was reviewed as well as her current medications and allergies refills of her chronic medications were given and the plan for yearly health maintenance was discussed all orders and referrals were made as appropriate.  Review of Systems  Constitutional: Negative.   HENT: Negative.   Eyes: Negative.   Respiratory: Negative.   Cardiovascular: Negative.   Gastrointestinal: Negative.   Endocrine: Negative.   Genitourinary: Negative.   Musculoskeletal: Negative.   Skin: Negative.   Allergic/Immunologic: Negative.   Neurological: Negative.   Hematological: Negative.   Psychiatric/Behavioral: Negative.    Past Medical History  Diagnosis Date  . H/O varicella   . PCOS (polycystic ovarian syndrome)   . Infections of genitourinary tract in pregnancy, unspecified as to episode of care(646.60)   . Diabetes mellitus without complication     glyburide    History   Social History  . Marital Status: Married    Spouse Name: N/A    Number of Children: N/A  . Years of Education: N/A   Occupational History  . Not on file.   Social History Main Topics  . Smoking status: Never Smoker   . Smokeless tobacco: Never Used  . Alcohol Use: No  . Drug Use: No  . Sexual Activity: Yes   Other Topics Concern  . Not on file   Social History Narrative  . No narrative on file    Past Surgical History  Procedure Laterality Date  . Cesarean section    . Tonsillectomy    . Eye surgery      lasic  . Nasal sinus surgery    . Cesarean section  07/01/2012    Procedure: CESAREAN SECTION;  Surgeon: Lenoard Aden, MD;  Location: [facility];  Service: Obstetrics;  Laterality: N/A;  Repeat cesarean section with delivery of baby girl at 51. Apgars 9/9.    Family History  Problem Relation Age of Onset  . Diabetes Mother   . Heart attack Father   . Heart disease Maternal Grandfather   . Diabetes Paternal Grandmother     Allergies  Allergen Reactions  . Penicillins Rash    Current Outpatient Prescriptions on File Prior to Visit  Medication Sig Dispense Refill  . ascorbic acid (VITAMIN C) 500 MG tablet Take 2,000 mg by mouth daily.      Marland Kitchen ibuprofen (ADVIL,MOTRIN) 600 MG tablet Take 1 tablet (600 mg total) by mouth every 6 (six) hours.  60 tablet  0  . oxyCODONE-acetaminophen (PERCOCET/ROXICET) 5-325 MG per tablet Take 1-2 tablets by mouth every 4 (four) hours as needed (moderate - severe pain).  30 tablet  0  . Prenatal Vit-Fe Sulfate-FA (PRENATAL VITAMIN PO) Take 1 tablet by mouth at bedtime.        No current facility-administered medications on file prior to visit.    BP 98/62  Pulse 66  Ht 5\' 6"  (1.676 m)  Wt 256 lb (116.121 kg)  BMI 41.34 kg/m2chart    Objective:   Physical Exam  Constitutional: She is oriented to person, place, and time. She appears well-developed and well-nourished.  HENT:  Head: Normocephalic and  atraumatic.  Right Ear: External ear normal.  Left Ear: External ear normal.  Nose: Nose normal.  Mouth/Throat: Oropharynx is clear and moist.  Eyes: Conjunctivae and EOM are normal. Pupils are equal, round, and reactive to light.  Neck: Normal range of motion. Neck supple. No thyromegaly present.  Cardiovascular: Normal rate, regular rhythm and normal heart sounds.   Pulmonary/Chest: Effort normal and breath sounds normal.  Abdominal: Soft. Bowel sounds are normal. She exhibits no distension. There is no tenderness. There is no rebound.  Genitourinary:  Deferred to GYN  Musculoskeletal: Normal range of motion. She exhibits no edema and no tenderness.   Neurological: She is alert and oriented to person, place, and time. She has normal reflexes.  Skin: Skin is warm and dry.  Psychiatric: She has a normal mood and affect.          Assessment & Plan:  Assessment: 1. Complete physical exam 2. Obesity 3. Polycystic ovarian syndrome 4. Hyperglycemia  Plan: I have sent to include CMP, lipid, CBC, A1c, TSH, UA notify patient of results. Encouraged healthy diet, exercise, self breast exams. We'll followup with patient pending labs and sooner as needed. Consider phentermine if her labs are stable.

## 2013-05-22 ENCOUNTER — Encounter: Payer: Self-pay | Admitting: Family

## 2013-05-24 ENCOUNTER — Other Ambulatory Visit: Payer: Self-pay | Admitting: Family

## 2013-05-24 MED ORDER — PHENTERMINE HCL 37.5 MG PO CAPS: 37.5000 mg | ORAL_CAPSULE | ORAL | Status: DC

## 2013-05-26 ENCOUNTER — Other Ambulatory Visit: Payer: Self-pay

## 2013-06-20 ENCOUNTER — Ambulatory Visit (INDEPENDENT_AMBULATORY_CARE_PROVIDER_SITE_OTHER): Payer: 59 | Admitting: Family

## 2013-06-20 ENCOUNTER — Encounter: Payer: Self-pay | Admitting: Family

## 2013-06-20 VITALS — BP 102/76 | HR 100 | Wt 250.0 lb

## 2013-06-20 DIAGNOSIS — E669 Obesity, unspecified: Secondary | ICD-10-CM

## 2013-06-20 DIAGNOSIS — E282 Polycystic ovarian syndrome: Secondary | ICD-10-CM

## 2013-06-20 MED ORDER — PHENTERMINE HCL 37.5 MG PO CAPS: 37.5000 mg | ORAL_CAPSULE | ORAL | Status: DC

## 2013-06-20 NOTE — Progress Notes (Signed)
Pre visit review using our clinic review tool, if applicable. No additional management support is needed unless otherwise documented below in the visit note. 

## 2013-06-20 NOTE — Progress Notes (Signed)
Subjective:    Patient ID: Tammy Mooney, female    DOB: 12/25/1980, 32 y.o.   MRN: [id number]  HPI 32 year old white female, is in for recheck of obesity. She is currently taken phentermine 37.5 mg once daily and tolerating it well overall. Has side effects of dry mouth and insomnia that it gradually improved over the last 3 weeks. She has joined [employer]. Is exercising about twice a week.   Review of Systems  Constitutional: Negative.   HENT: Negative.   Respiratory: Negative.   Cardiovascular: Negative.   Gastrointestinal: Negative.   Endocrine: Negative.   Genitourinary: Negative.   Musculoskeletal: Negative.   Skin: Negative.   Neurological: Negative.   Psychiatric/Behavioral: Negative.    Past Medical History  Diagnosis Date  . H/O varicella   . PCOS (polycystic ovarian syndrome)   . Infections of genitourinary tract in pregnancy, unspecified as to episode of care(646.60)   . Diabetes mellitus without complication     glyburide    History   Social History  . Marital Status: Married    Spouse Name: N/A    Number of Children: N/A  . Years of Education: N/A   Occupational History  . Not on file.   Social History Main Topics  . Smoking status: Never Smoker   . Smokeless tobacco: Never Used  . Alcohol Use: No  . Drug Use: No  . Sexual Activity: Yes   Other Topics Concern  . Not on file   Social History Narrative  . No narrative on file    Past Surgical History  Procedure Laterality Date  . Cesarean section    . Tonsillectomy    . Eye surgery      lasic  . Nasal sinus surgery    . Cesarean section  07/01/2012    Procedure: CESAREAN SECTION;  Surgeon: Lenoard Aden, MD;  Location: [facility];  Service: Obstetrics;  Laterality: N/A;  Repeat cesarean section with delivery of baby girl at 72. Apgars 9/9.    Family History  Problem Relation Age of Onset  . Diabetes Mother   . Heart attack Father   . Heart disease Maternal Grandfather     . Diabetes Paternal Grandmother     Allergies  Allergen Reactions  . Penicillins Rash    Current Outpatient Prescriptions on File Prior to Visit  Medication Sig Dispense Refill  . ascorbic acid (VITAMIN C) 500 MG tablet Take 2,000 mg by mouth daily.      . cholecalciferol (VITAMIN D) 1000 UNITS tablet Take 1,000 Units by mouth daily.      . fluticasone (FLONASE) 50 MCG/ACT nasal spray Place 2 sprays into the nose daily.  16 g  6  . ibuprofen (ADVIL,MOTRIN) 600 MG tablet Take 1 tablet (600 mg total) by mouth every 6 (six) hours.  60 tablet  0  . metFORMIN (GLUCOPHAGE) 1000 MG tablet Take 1,000 mg by mouth daily with breakfast.      . oxyCODONE-acetaminophen (PERCOCET/ROXICET) 5-325 MG per tablet Take 1-2 tablets by mouth every 4 (four) hours as needed (moderate - severe pain).  30 tablet  0  . Prenatal Vit-Fe Sulfate-FA (PRENATAL VITAMIN PO) Take 1 tablet by mouth at bedtime.        No current facility-administered medications on file prior to visit.    BP 102/76  Pulse 100  Wt 250 lb (113.399 kg)chart    Objective:   Physical Exam  Constitutional: She is oriented to person, place, and time.  She appears well-developed and well-nourished.  HENT:  Right Ear: External ear normal.  Left Ear: External ear normal.  Nose: Nose normal.  Mouth/Throat: Oropharynx is clear and moist.  Neck: Normal range of motion. Neck supple.  Cardiovascular: Normal rate, regular rhythm and normal heart sounds.   Pulmonary/Chest: Breath sounds normal.  Musculoskeletal: Normal range of motion.  Neurological: She is alert and oriented to person, place, and time.  Skin: Skin is warm and dry.  Psychiatric: She has a normal mood and affect.          Assessment & Plan:  Assessment: 1. Obesity 2. Polycystic ovarian syndrome   Plan: Phentermine 37.5 mg once daily x1 month. Recheck in one month for a weight check. In the office with any questions or concerns. Recheck as scheduled, and as needed  sooner.

## 2013-06-20 NOTE — Patient Instructions (Signed)

## 2013-07-29 ENCOUNTER — Ambulatory Visit: Payer: 59 | Admitting: Family

## 2013-08-05 ENCOUNTER — Ambulatory Visit: Payer: 59 | Admitting: Family

## 2013-08-05 ENCOUNTER — Ambulatory Visit (INDEPENDENT_AMBULATORY_CARE_PROVIDER_SITE_OTHER): Payer: 59 | Admitting: Family

## 2013-08-05 ENCOUNTER — Encounter: Payer: Self-pay | Admitting: Family

## 2013-08-05 VITALS — BP 118/72 | HR 88 | Wt 244.0 lb

## 2013-08-05 DIAGNOSIS — E119 Type 2 diabetes mellitus without complications: Secondary | ICD-10-CM

## 2013-08-05 DIAGNOSIS — E669 Obesity, unspecified: Secondary | ICD-10-CM

## 2013-08-05 MED ORDER — PHENTERMINE HCL 37.5 MG PO CAPS: 37.5000 mg | ORAL_CAPSULE | ORAL | Status: DC

## 2013-08-05 NOTE — Patient Instructions (Signed)
Exercise to Stay Healthy Exercise helps you become and stay healthy. EXERCISE IDEAS AND TIPS Choose exercises that:  You enjoy.  Fit into your day. You do not need to exercise really hard to be healthy. You can do exercises at a slow or medium level and stay healthy. You can:  Stretch before and after working out.  Try yoga, Pilates, or tai chi.  Lift weights.  Walk fast, swim, jog, run, climb stairs, bicycle, dance, or rollerskate.  Take aerobic classes. Exercises that burn about 150 calories:  Running 1  miles in 15 minutes.  Playing volleyball for 45 to 60 minutes.  Washing and waxing a car for 45 to 60 minutes.  Playing touch football for 45 minutes.  Walking 1  miles in 35 minutes.  Pushing a stroller 1  miles in 30 minutes.  Playing basketball for 30 minutes.  Raking leaves for 30 minutes.  Bicycling 5 miles in 30 minutes.  Walking 2 miles in 30 minutes.  Dancing for 30 minutes.  Shoveling snow for 15 minutes.  Swimming laps for 20 minutes.  Walking up stairs for 15 minutes.  Bicycling 4 miles in 15 minutes.  Gardening for 30 to 45 minutes.  Jumping rope for 15 minutes.  Washing windows or floors for 45 to 60 minutes. Document Released: 08/09/2010 Document Revised: 09/29/2011 Document Reviewed: 08/09/2010 [employer] Patient Information 2014 [employer].  

## 2013-08-05 NOTE — Progress Notes (Signed)
Subjective:    Patient ID: Tammy Mooney, female    DOB: 12-22-1980, 33 y.o.   MRN: [id number]  HPI  33 year old white female, nonsmoker is in for recheck of obesity. She's currently taking phentermine 37.5 mg once daily. Is tolerating it well. She is down 6 pounds from her last office visit. She recently began to implement exercise to include swimming, elliptical, and free weights.  Review of Systems  Constitutional: Negative.   Respiratory: Negative.   Cardiovascular: Negative.   Musculoskeletal: Negative.   Skin: Negative.   Allergic/Immunologic: Negative.   Psychiatric/Behavioral: Negative.    Past Medical History  Diagnosis Date  . H/O varicella   . PCOS (polycystic ovarian syndrome)   . Infections of genitourinary tract in pregnancy, unspecified as to episode of care(646.60)   . Diabetes mellitus without complication     glyburide    History   Social History  . Marital Status: Married    Spouse Name: N/A    Number of Children: N/A  . Years of Education: N/A   Occupational History  . Not on file.   Social History Main Topics  . Smoking status: Never Smoker   . Smokeless tobacco: Never Used  . Alcohol Use: No  . Drug Use: No  . Sexual Activity: Yes   Other Topics Concern  . Not on file   Social History Narrative  . No narrative on file    Past Surgical History  Procedure Laterality Date  . Cesarean section    . Tonsillectomy    . Eye surgery      lasic  . Nasal sinus surgery    . Cesarean section  07/01/2012    Procedure: CESAREAN SECTION;  Surgeon: Lovenia Kim, MD;  Location: [facility];  Service: Obstetrics;  Laterality: N/A;  Repeat cesarean section with delivery of baby girl at 42. Apgars 9/9.    Family History  Problem Relation Age of Onset  . Diabetes Mother   . Heart attack Father   . Heart disease Maternal Grandfather   . Diabetes Paternal Grandmother     Allergies  Allergen Reactions  . Penicillins Rash    Current  Outpatient Prescriptions on File Prior to Visit  Medication Sig Dispense Refill  . ascorbic acid (VITAMIN C) 500 MG tablet Take 2,000 mg by mouth daily.      . cholecalciferol (VITAMIN D) 1000 UNITS tablet Take 1,000 Units by mouth daily.      . fluticasone (FLONASE) 50 MCG/ACT nasal spray Place 2 sprays into the nose daily.  16 g  6  . Prenatal Vit-Fe Sulfate-FA (PRENATAL VITAMIN PO) Take 1 tablet by mouth at bedtime.       Marland Kitchen ibuprofen (ADVIL,MOTRIN) 600 MG tablet Take 1 tablet (600 mg total) by mouth every 6 (six) hours.  60 tablet  0  . metFORMIN (GLUCOPHAGE) 1000 MG tablet Take 1,000 mg by mouth daily with breakfast.      . oxyCODONE-acetaminophen (PERCOCET/ROXICET) 5-325 MG per tablet Take 1-2 tablets by mouth every 4 (four) hours as needed (moderate - severe pain).  30 tablet  0   No current facility-administered medications on file prior to visit.    BP 118/72  Pulse 88  Wt 244 lb (110.678 kg)chart    Objective:   Physical Exam  Constitutional: She is oriented to person, place, and time. She appears well-developed and well-nourished.  Neck: Normal range of motion. Neck supple.  Cardiovascular: Normal rate, regular rhythm and normal heart sounds.  Pulmonary/Chest: Effort normal and breath sounds normal.  Musculoskeletal: Normal range of motion.  Neurological: She is alert and oriented to person, place, and time.  Skin: Skin is warm and dry.  Psychiatric: She has a normal mood and affect.          Assessment & Plan:  Assessment: 1. Obesity-improving  Plan: Continue phentermine 37.5 mg once daily. Drink plenty of water. Exercise 3-4 times per week, 45 minute intervals. Cardiologist with any questions or concerns. Check in 6 weeks, and if needed.

## 2013-08-05 NOTE — Progress Notes (Signed)
Pre visit review using our clinic review tool, if applicable. No additional management support is needed unless otherwise documented below in the visit note. 

## 2013-09-16 ENCOUNTER — Ambulatory Visit: Payer: 59 | Admitting: Family

## 2013-10-07 ENCOUNTER — Ambulatory Visit: Payer: 59 | Admitting: Family

## 2013-10-11 ENCOUNTER — Encounter: Payer: Self-pay | Admitting: Family

## 2014-05-22 ENCOUNTER — Encounter: Payer: Self-pay | Admitting: Family

## 2014-10-10 ENCOUNTER — Other Ambulatory Visit: Payer: Self-pay | Admitting: Obstetrics and Gynecology

## 2014-10-23 ENCOUNTER — Encounter (HOSPITAL_COMMUNITY): Payer: Self-pay

## 2014-10-23 ENCOUNTER — Encounter (HOSPITAL_COMMUNITY)
Admission: RE | Admit: 2014-10-23 | Discharge: 2014-10-23 | Disposition: A | Discharge status: Home / Self Care | Payer: 59 | Source: Ambulatory Visit | Attending: Obstetrics and Gynecology | Admitting: Obstetrics and Gynecology

## 2014-10-23 LAB — CBC
HEMATOCRIT: 39.8 % (ref 36.0–46.0)
HEMOGLOBIN: 13.4 g/dL (ref 12.0–15.0)
MCH: 26.9 pg (ref 26.0–34.0)
MCHC: 33.7 g/dL (ref 30.0–36.0)
MCV: 79.8 fL (ref 78.0–100.0)
Platelets: 303 10*3/uL (ref 150–400)
RBC: 4.99 MIL/uL (ref 3.87–5.11)
RDW: 15.9 % — AB (ref 11.5–15.5)
WBC: 11.3 10*3/uL — ABNORMAL HIGH (ref 4.0–10.5)

## 2014-10-23 LAB — BASIC METABOLIC PANEL
Anion gap: 10 (ref 5–15)
BUN: 12 mg/dL (ref 6–23)
CO2: 20 mmol/L (ref 19–32)
Calcium: 9.5 mg/dL (ref 8.4–10.5)
Chloride: 105 mmol/L (ref 96–112)
Creatinine, Ser: 0.59 mg/dL (ref 0.50–1.10)
GFR calc Af Amer: >90 mL/min (ref >90)
GFR calc non Af Amer: >90 mL/min (ref >90)
GLUCOSE: 206 mg/dL — AB (ref 70–99)
POTASSIUM: 3.5 mmol/L (ref 3.5–5.1)
Sodium: 135 mmol/L (ref 135–145)

## 2014-10-23 LAB — TYPE AND SCREEN
ABO/RH(D): A POS
Antibody Screen: NEGATIVE

## 2014-10-23 NOTE — Patient Instructions (Signed)
   Your procedure is scheduled on: April 6 AT 1PM  Enter through the Main Entrance of [facility] up the phone at the desk and dial [phone] and inform us of your arrival.  Please call this number if you have any problems the morning of surgery: [phone]  Remember: Do not eat food after midnight:APRIL 5 Do not drink clear liquids after: 9AM ON April 6 Take these medicines the morning of surgery with a SIP OF WATER: TAKE 18UNITS LANTUS NIGHT BEFORE SURGERY.. DO NOT TAKE METFORMIN DAY OF SURGERY  Do not wear jewelry, make-up, or FINGER nail polish No metal in your hair or on your body. Do not wear lotions, powders, perfumes.  You may wear deodorant.  Do not bring valuables to the hospital. Contacts, dentures or bridgework may not be worn into surgery.  Leave suitcase in the car. After Surgery it may be brought to your room. For patients being admitted to the hospital, checkout time is 11:00am the day of discharge.

## 2014-10-24 LAB — RPR: RPR Ser Ql: NONREACTIVE

## 2014-10-24 MED ORDER — DEXTROSE 5 % IV SOLN
3.0000 g | INTRAVENOUS | Status: AC
  Administered 2014-10-25: 3 g via INTRAVENOUS
  Filled 2014-10-24: qty 3000

## 2014-10-25 ENCOUNTER — Inpatient Hospital Stay (HOSPITAL_COMMUNITY): Payer: 59 | Admitting: Anesthesiology

## 2014-10-25 ENCOUNTER — Inpatient Hospital Stay (HOSPITAL_COMMUNITY)
Admission: RE | Admit: 2014-10-25 | Discharge: 2014-10-27 | DRG: 765 | Disposition: A | Discharge status: Home / Self Care | Payer: 59 | Source: Ambulatory Visit | Attending: Obstetrics and Gynecology | Admitting: Obstetrics and Gynecology

## 2014-10-25 ENCOUNTER — Encounter (HOSPITAL_COMMUNITY)
Admission: RE | Disposition: A | Discharge status: Home / Self Care | Payer: Self-pay | Source: Ambulatory Visit | Attending: Obstetrics and Gynecology

## 2014-10-25 ENCOUNTER — Encounter (HOSPITAL_COMMUNITY): Payer: Self-pay

## 2014-10-25 ENCOUNTER — Encounter (HOSPITAL_COMMUNITY): Payer: Self-pay | Admitting: *Deleted

## 2014-10-25 DIAGNOSIS — O24113 Pre-existing diabetes mellitus, type 2, in pregnancy, third trimester: Secondary | ICD-10-CM | POA: Diagnosis present

## 2014-10-25 DIAGNOSIS — Z6841 Body Mass Index (BMI) 40.0 and over, adult: Secondary | ICD-10-CM

## 2014-10-25 DIAGNOSIS — O3421 Maternal care for scar from previous cesarean delivery: Secondary | ICD-10-CM | POA: Diagnosis present

## 2014-10-25 DIAGNOSIS — Z3A39 39 weeks gestation of pregnancy: Secondary | ICD-10-CM | POA: Diagnosis present

## 2014-10-25 DIAGNOSIS — O09523 Supervision of elderly multigravida, third trimester: Secondary | ICD-10-CM | POA: Diagnosis not present

## 2014-10-25 DIAGNOSIS — O3663X0 Maternal care for excessive fetal growth, third trimester, not applicable or unspecified: Secondary | ICD-10-CM | POA: Diagnosis present

## 2014-10-25 DIAGNOSIS — E1165 Type 2 diabetes mellitus with hyperglycemia: Secondary | ICD-10-CM | POA: Diagnosis present

## 2014-10-25 DIAGNOSIS — Z833 Family history of diabetes mellitus: Secondary | ICD-10-CM

## 2014-10-25 DIAGNOSIS — O99213 Obesity complicating pregnancy, third trimester: Secondary | ICD-10-CM | POA: Diagnosis present

## 2014-10-25 LAB — GLUCOSE, CAPILLARY
GLUCOSE-CAPILLARY: 73 mg/dL (ref 70–99)
Glucose-Capillary: 78 mg/dL (ref 70–99)
Glucose-Capillary: 134 mg/dL — ABNORMAL HIGH (ref 70–99)

## 2014-10-25 SURGERY — Surgical Case
Anesthesia: Spinal

## 2014-10-25 MED ORDER — OXYTOCIN 40 UNITS IN LACTATED RINGERS INFUSION - SIMPLE MED: 62.5000 mL/h | INTRAVENOUS | Status: DC

## 2014-10-25 MED ORDER — OXYCODONE-ACETAMINOPHEN 5-325 MG PO TABS
1.0000 | ORAL_TABLET | ORAL | Status: DC | PRN
  Administered 2014-10-26: 1 via ORAL
  Filled 2014-10-25: qty 1

## 2014-10-25 MED ORDER — LANOLIN HYDROUS EX OINT: 1.0000 "application " | TOPICAL_OINTMENT | CUTANEOUS | Status: DC | PRN

## 2014-10-25 MED ORDER — SIMETHICONE 80 MG PO CHEW
80.0000 mg | CHEWABLE_TABLET | Freq: Three times a day (TID) | ORAL | Status: DC
  Administered 2014-10-25 – 2014-10-26 (×3): 80 mg via ORAL
  Filled 2014-10-25 (×4): qty 1

## 2014-10-25 MED ORDER — NALBUPHINE HCL 10 MG/ML IJ SOLN
5.0000 mg | Freq: Once | INTRAMUSCULAR | Status: AC | PRN
Start: 2014-10-25 — End: 2014-10-25

## 2014-10-25 MED ORDER — PHENYLEPHRINE 40 MCG/ML (10ML) SYRINGE FOR IV PUSH (FOR BLOOD PRESSURE SUPPORT)
PREFILLED_SYRINGE | INTRAVENOUS | Status: AC
  Filled 2014-10-25: qty 10

## 2014-10-25 MED ORDER — MORPHINE SULFATE 0.5 MG/ML IJ SOLN
INTRAMUSCULAR | Status: AC
  Filled 2014-10-25: qty 10

## 2014-10-25 MED ORDER — BUPIVACAINE LIPOSOME 1.3 % IJ SUSP
20.0000 mL | Freq: Once | INTRAMUSCULAR | Status: DC
  Filled 2014-10-25: qty 20

## 2014-10-25 MED ORDER — PHENYLEPHRINE 8 MG IN D5W 100 ML (0.08MG/ML) PREMIX OPTIME
INJECTION | INTRAVENOUS | Status: AC
  Filled 2014-10-25: qty 100

## 2014-10-25 MED ORDER — DIBUCAINE 1 % RE OINT: 1.0000 "application " | TOPICAL_OINTMENT | RECTAL | Status: DC | PRN

## 2014-10-25 MED ORDER — ZOLPIDEM TARTRATE 5 MG PO TABS: 5.0000 mg | ORAL_TABLET | Freq: Every evening | ORAL | Status: DC | PRN

## 2014-10-25 MED ORDER — MORPHINE SULFATE (PF) 0.5 MG/ML IJ SOLN
INTRAMUSCULAR | Status: DC | PRN
  Administered 2014-10-25: 100 ug via INTRATHECAL

## 2014-10-25 MED ORDER — TETANUS-DIPHTH-ACELL PERTUSSIS 5-2.5-18.5 LF-MCG/0.5 IM SUSP: 0.5000 mL | Freq: Once | INTRAMUSCULAR | Status: DC

## 2014-10-25 MED ORDER — SODIUM CHLORIDE 0.9 % IR SOLN
Status: DC | PRN
  Administered 2014-10-25: 1000 mL

## 2014-10-25 MED ORDER — SCOPOLAMINE 1 MG/3DAYS TD PT72: 1.0000 | MEDICATED_PATCH | Freq: Once | TRANSDERMAL | Status: DC

## 2014-10-25 MED ORDER — VITAMIN K1 1 MG/0.5ML IJ SOLN
INTRAMUSCULAR | Status: AC
Start: 2014-10-25 — End: 2014-10-26
  Filled 2014-10-25: qty 0.5

## 2014-10-25 MED ORDER — METFORMIN HCL 500 MG PO TABS
500.0000 mg | ORAL_TABLET | Freq: Every day | ORAL | Status: DC
  Administered 2014-10-26: 500 mg via ORAL
  Filled 2014-10-25: qty 1

## 2014-10-25 MED ORDER — SENNOSIDES-DOCUSATE SODIUM 8.6-50 MG PO TABS
2.0000 | ORAL_TABLET | ORAL | Status: DC
  Administered 2014-10-26 (×2): 2 via ORAL
  Filled 2014-10-25 (×2): qty 2

## 2014-10-25 MED ORDER — DIPHENHYDRAMINE HCL 25 MG PO CAPS: 25.0000 mg | ORAL_CAPSULE | Freq: Four times a day (QID) | ORAL | Status: DC | PRN

## 2014-10-25 MED ORDER — METFORMIN HCL 500 MG PO TABS
1000.0000 mg | ORAL_TABLET | Freq: Every day | ORAL | Status: DC
  Administered 2014-10-25: 1000 mg via ORAL
  Filled 2014-10-25: qty 2

## 2014-10-25 MED ORDER — SCOPOLAMINE 1 MG/3DAYS TD PT72
1.0000 | MEDICATED_PATCH | Freq: Once | TRANSDERMAL | Status: DC
  Administered 2014-10-25: 1.5 mg via TRANSDERMAL

## 2014-10-25 MED ORDER — MENTHOL 3 MG MT LOZG: 1.0000 | LOZENGE | OROMUCOSAL | Status: DC | PRN

## 2014-10-25 MED ORDER — METFORMIN HCL 500 MG PO TABS
500.0000 mg | ORAL_TABLET | Freq: Every day | ORAL | Status: DC
  Filled 2014-10-25: qty 1

## 2014-10-25 MED ORDER — BUPIVACAINE HCL (PF) 0.25 % IJ SOLN
INTRAMUSCULAR | Status: AC
  Filled 2014-10-25: qty 10

## 2014-10-25 MED ORDER — METOCLOPRAMIDE HCL 5 MG/ML IJ SOLN
INTRAMUSCULAR | Status: AC
  Filled 2014-10-25: qty 2

## 2014-10-25 MED ORDER — BUPIVACAINE LIPOSOME 1.3 % IJ SUSP
INTRAMUSCULAR | Status: DC | PRN
  Administered 2014-10-25: 20 mL

## 2014-10-25 MED ORDER — OXYTOCIN 10 UNIT/ML IJ SOLN: INTRAMUSCULAR | Status: DC | PRN

## 2014-10-25 MED ORDER — METOCLOPRAMIDE HCL 5 MG/ML IJ SOLN
INTRAMUSCULAR | Status: DC | PRN
  Administered 2014-10-25: 10 mg via INTRAVENOUS

## 2014-10-25 MED ORDER — SIMETHICONE 80 MG PO CHEW
80.0000 mg | CHEWABLE_TABLET | ORAL | Status: DC
Start: 2014-10-26 — End: 2014-10-27
  Administered 2014-10-26 (×2): 80 mg via ORAL
  Filled 2014-10-25 (×2): qty 1

## 2014-10-25 MED ORDER — PHENYLEPHRINE HCL 10 MG/ML IJ SOLN
INTRAMUSCULAR | Status: DC | PRN
  Administered 2014-10-25: 120 ug via INTRAVENOUS

## 2014-10-25 MED ORDER — SODIUM CHLORIDE 0.9 % IJ SOLN: 3.0000 mL | INTRAMUSCULAR | Status: DC | PRN

## 2014-10-25 MED ORDER — PHENYLEPHRINE 8 MG IN D5W 100 ML (0.08MG/ML) PREMIX OPTIME
INJECTION | INTRAVENOUS | Status: DC | PRN
  Administered 2014-10-25: 60 ug/min via INTRAVENOUS

## 2014-10-25 MED ORDER — HYDROMORPHONE HCL 1 MG/ML IJ SOLN: 0.2500 mg | INTRAMUSCULAR | Status: DC | PRN

## 2014-10-25 MED ORDER — LACTATED RINGERS IV SOLN
INTRAVENOUS | Status: DC
  Administered 2014-10-25: 19:00:00 via INTRAVENOUS
  Administered 2014-10-26: 1000 mL via INTRAVENOUS

## 2014-10-25 MED ORDER — METFORMIN HCL 500 MG PO TABS: 500.0000 mg | ORAL_TABLET | Freq: Two times a day (BID) | ORAL | Status: DC

## 2014-10-25 MED ORDER — OXYCODONE-ACETAMINOPHEN 5-325 MG PO TABS: 2.0000 | ORAL_TABLET | ORAL | Status: DC | PRN

## 2014-10-25 MED ORDER — LACTATED RINGERS IV SOLN
Freq: Once | INTRAVENOUS | Status: AC
  Administered 2014-10-25: 12:00:00 via INTRAVENOUS

## 2014-10-25 MED ORDER — ERYTHROMYCIN 5 MG/GM OP OINT
TOPICAL_OINTMENT | OPHTHALMIC | Status: AC
  Filled 2014-10-25: qty 1

## 2014-10-25 MED ORDER — ONDANSETRON HCL 4 MG/2ML IJ SOLN
INTRAMUSCULAR | Status: AC
  Filled 2014-10-25: qty 2

## 2014-10-25 MED ORDER — OXYTOCIN 10 UNIT/ML IJ SOLN
INTRAMUSCULAR | Status: AC
  Filled 2014-10-25: qty 4

## 2014-10-25 MED ORDER — NALOXONE HCL 0.4 MG/ML IJ SOLN: 0.4000 mg | INTRAMUSCULAR | Status: DC | PRN

## 2014-10-25 MED ORDER — ONDANSETRON HCL 4 MG/2ML IJ SOLN
INTRAMUSCULAR | Status: DC | PRN
  Administered 2014-10-25: 4 mg via INTRAVENOUS

## 2014-10-25 MED ORDER — FENTANYL CITRATE 0.05 MG/ML IJ SOLN
INTRAMUSCULAR | Status: AC
  Filled 2014-10-25: qty 2

## 2014-10-25 MED ORDER — SCOPOLAMINE 1 MG/3DAYS TD PT72
MEDICATED_PATCH | TRANSDERMAL | Status: AC
  Administered 2014-10-25: 1.5 mg via TRANSDERMAL
  Filled 2014-10-25: qty 1

## 2014-10-25 MED ORDER — DIPHENHYDRAMINE HCL 25 MG PO CAPS: 25.0000 mg | ORAL_CAPSULE | ORAL | Status: DC | PRN

## 2014-10-25 MED ORDER — NALBUPHINE HCL 10 MG/ML IJ SOLN
5.0000 mg | INTRAMUSCULAR | Status: DC | PRN
  Administered 2014-10-26: 5 mg via INTRAVENOUS
  Filled 2014-10-25: qty 1

## 2014-10-25 MED ORDER — METHYLERGONOVINE MALEATE 0.2 MG/ML IJ SOLN: 0.2000 mg | INTRAMUSCULAR | Status: DC | PRN

## 2014-10-25 MED ORDER — METHYLERGONOVINE MALEATE 0.2 MG PO TABS: 0.2000 mg | ORAL_TABLET | ORAL | Status: DC | PRN

## 2014-10-25 MED ORDER — IBUPROFEN 600 MG PO TABS
600.0000 mg | ORAL_TABLET | Freq: Four times a day (QID) | ORAL | Status: DC
  Administered 2014-10-25 – 2014-10-27 (×7): 600 mg via ORAL
  Filled 2014-10-25 (×7): qty 1

## 2014-10-25 MED ORDER — ACETAMINOPHEN 325 MG PO TABS: 650.0000 mg | ORAL_TABLET | ORAL | Status: DC | PRN

## 2014-10-25 MED ORDER — MEPERIDINE HCL 25 MG/ML IJ SOLN: 6.2500 mg | INTRAMUSCULAR | Status: DC | PRN

## 2014-10-25 MED ORDER — LACTATED RINGERS IV SOLN
INTRAVENOUS | Status: DC | PRN
  Administered 2014-10-25: 14:00:00 via INTRAVENOUS

## 2014-10-25 MED ORDER — WITCH HAZEL-GLYCERIN EX PADS: 1.0000 "application " | MEDICATED_PAD | CUTANEOUS | Status: DC | PRN

## 2014-10-25 MED ORDER — BUPIVACAINE IN DEXTROSE 0.75-8.25 % IT SOLN
INTRATHECAL | Status: DC | PRN
  Administered 2014-10-25: 1.6 mL via INTRATHECAL

## 2014-10-25 MED ORDER — BUPIVACAINE HCL (PF) 0.25 % IJ SOLN
INTRAMUSCULAR | Status: DC | PRN
  Administered 2014-10-25: 10 mL

## 2014-10-25 MED ORDER — ONDANSETRON HCL 4 MG/2ML IJ SOLN: 4.0000 mg | Freq: Three times a day (TID) | INTRAMUSCULAR | Status: DC | PRN

## 2014-10-25 MED ORDER — OXYTOCIN 10 UNIT/ML IJ SOLN
40.0000 [IU] | INTRAVENOUS | Status: DC | PRN
  Administered 2014-10-25: 40 [IU] via INTRAVENOUS

## 2014-10-25 MED ORDER — PRENATAL MULTIVITAMIN CH
1.0000 | ORAL_TABLET | Freq: Every day | ORAL | Status: DC
  Administered 2014-10-26: 1 via ORAL
  Filled 2014-10-25: qty 1

## 2014-10-25 MED ORDER — SIMETHICONE 80 MG PO CHEW
80.0000 mg | CHEWABLE_TABLET | ORAL | Status: DC | PRN
  Administered 2014-10-27: 80 mg via ORAL

## 2014-10-25 MED ORDER — METFORMIN HCL 500 MG PO TABS
1000.0000 mg | ORAL_TABLET | Freq: Every day | ORAL | Status: DC
  Filled 2014-10-25: qty 2

## 2014-10-25 MED ORDER — NALOXONE HCL 1 MG/ML IJ SOLN
1.0000 ug/kg/h | INTRAVENOUS | Status: DC | PRN
  Filled 2014-10-25: qty 2

## 2014-10-25 MED ORDER — NALBUPHINE HCL 10 MG/ML IJ SOLN: 5.0000 mg | Freq: Once | INTRAMUSCULAR | Status: AC | PRN

## 2014-10-25 MED ORDER — NALBUPHINE HCL 10 MG/ML IJ SOLN: 5.0000 mg | INTRAMUSCULAR | Status: DC | PRN

## 2014-10-25 MED ORDER — LACTATED RINGERS IV SOLN
INTRAVENOUS | Status: DC
  Administered 2014-10-25: 13:00:00 via INTRAVENOUS

## 2014-10-25 MED ORDER — DIPHENHYDRAMINE HCL 50 MG/ML IJ SOLN: 12.5000 mg | INTRAMUSCULAR | Status: DC | PRN

## 2014-10-25 SURGICAL SUPPLY — 39 items
ADH SKN CLS LQ APL DERMABOND (GAUZE/BANDAGES/DRESSINGS) ×1
CLAMP CORD UMBIL (MISCELLANEOUS) IMPLANT
CLOTH BEACON ORANGE TIMEOUT ST (SAFETY) ×2 IMPLANT
CONTAINER PREFILL 10% NBF 15ML (MISCELLANEOUS) IMPLANT
DERMABOND ADHESIVE PROPEN (GAUZE/BANDAGES/DRESSINGS) ×1
DERMABOND ADVANCED .7 DNX6 (GAUZE/BANDAGES/DRESSINGS) IMPLANT
DRAPE SHEET LG 3/4 BI-LAMINATE (DRAPES) IMPLANT
DRSG OPSITE POSTOP 4X10 (GAUZE/BANDAGES/DRESSINGS) ×2 IMPLANT
DURAPREP 26ML APPLICATOR (WOUND CARE) ×2 IMPLANT
ELECT REM PT RETURN 9FT ADLT (ELECTROSURGICAL) ×2
ELECTRODE REM PT RTRN 9FT ADLT (ELECTROSURGICAL) ×1 IMPLANT
EXTRACTOR VACUUM M CUP 4 TUBE (SUCTIONS) IMPLANT
GLOVE BIO SURGEON STRL SZ7.5 (GLOVE) ×2 IMPLANT
GOWN STRL REUS W/TWL LRG LVL3 (GOWN DISPOSABLE) ×4 IMPLANT
KIT ABG SYR 3ML LUER SLIP (SYRINGE) IMPLANT
NDL HYPO 25X5/8 SAFETYGLIDE (NEEDLE) IMPLANT
NDL SPNL 20GX3.5 QUINCKE YW (NEEDLE) IMPLANT
NEEDLE HYPO 22GX1.5 SAFETY (NEEDLE) ×2 IMPLANT
NEEDLE HYPO 25X5/8 SAFETYGLIDE (NEEDLE) IMPLANT
NEEDLE SPNL 20GX3.5 QUINCKE YW (NEEDLE) IMPLANT
NS IRRIG 1000ML POUR BTL (IV SOLUTION) ×2 IMPLANT
PACK C SECTION WH (CUSTOM PROCEDURE TRAY) ×2 IMPLANT
PAD ABD 8X7 1/2 STERILE (GAUZE/BANDAGES/DRESSINGS) ×1 IMPLANT
RETAINER VISCERAL (MISCELLANEOUS) ×1 IMPLANT
SPONGE GAUZE 4X4 12PLY STER LF (GAUZE/BANDAGES/DRESSINGS) ×2 IMPLANT
STAPLER VISISTAT 35W (STAPLE) IMPLANT
SUT MNCRL 0 VIOLET CTX 36 (SUTURE) ×2 IMPLANT
SUT MNCRL AB 3-0 PS2 27 (SUTURE) ×1 IMPLANT
SUT MON AB 2-0 CT1 27 (SUTURE) ×2 IMPLANT
SUT MON AB-0 CT1 36 (SUTURE) ×4 IMPLANT
SUT MONOCRYL 0 CTX 36 (SUTURE) ×2
SUT PLAIN 0 NONE (SUTURE) IMPLANT
SUT PLAIN 2 0 (SUTURE)
SUT PLAIN 2 0 XLH (SUTURE) ×1 IMPLANT
SUT PLAIN ABS 2-0 CT1 27XMFL (SUTURE) IMPLANT
SYR 20CC LL (SYRINGE) IMPLANT
SYR CONTROL 10ML LL (SYRINGE) ×2 IMPLANT
TOWEL OR 17X24 6PK STRL BLUE (TOWEL DISPOSABLE) ×2 IMPLANT
TRAY FOLEY CATH SILVER 14FR (SET/KITS/TRAYS/PACK) ×2 IMPLANT

## 2014-10-25 NOTE — Lactation Note (Signed)
This note was copied from the chart of Tammy Mooney. Lactation Consultation Note  Patient Name: Tammy Francia Verry BOFBP'Z Date: 10/25/2014 Reason for consult: Initial assessment   Initial consult at 57 hours old.  Infant asleep STS on dad's chest when LC entered room.  GA 40.0; BW 9#7oz.   Infant has breastfed x2 (10-60 min) + attempt x1 (5 min); voids-2; stools-1 since birth.  LS-7 by RN. Mom is a P3 and has experience breastfeeding previous children but reports early supplementation and low milk supply with previous children. Mom has Hx PCOS, GDM insulin dependant-poorly controlled, AMA, obesity.  Mom has large soft breast with large nipples. Questionable whether low MS with previous children was r/t PCOS or if it was r/t early supplementation. Reviewed feeding cues and feeding with cues, importance of exclusive breastfeeding for establishing milk supply, size of infant's stomach, and cluster feeding.  Stressed importance of infant obtaining deep latch for milk transfer. Hand expression reviewed with return demonstration and observation of colostrum slowly flowing from nipples.   Mom states infant had just fed 1 hr prior to [facility] entering but infant only fed for 5 minutes and then went to sleep. 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  Encouraged to consult with store about obtaining a DEBP for this pregnancy as part of her Healthy Pregnancy program.   Lactation brochure given and informed of outpatient services and hospital support group.   Encouraged mom to call for latch check and to call for assistance as needed.     Maternal Data Formula Feeding for Exclusion: No Has patient been taught Hand Expression?: Yes Does the patient have breastfeeding experience prior to this delivery?: Yes  Feeding Feeding Type: Breast Fed Length of feed: 5 min  LATCH Score/Interventions                      Lactation Tools  Discussed/Used WIC Program: No   Consult Status Consult Status: Follow-up Date: 10/26/14 Follow-up type: In-patient    Merlene Laughter 10/25/2014, 9:15 PM

## 2014-10-25 NOTE — H&P (Signed)
NAME:  ERIC, MORGANTI NO.:  MEDICAL RECORD NO.:  [id number]  LOCATION:                                 FACILITY:  PHYSICIAN:  Lovenia Kim, M.D.DATE OF BIRTH:  1981-07-06  DATE OF ADMISSION: DATE OF DISCHARGE:                             HISTORY & PHYSICAL   CHIEF COMPLAINT:  Previous C-section x3 at 40 weeks with IDDM.  HISTORY OF PRESENT ILLNESS:  This is a 34 year old white female, G2, P2,IDDM for repeat cesarean section.    MEDICATIONS:  Include metformin, insulin, and prenatal vitamins.  SOCIAL HISTORY:  She is a nonsmoker and nondrinker.  She denies domestic or physical violence.  FAMILY HISTORY:  Diabetes .  PAST SURGICAL HISTORY:  History of C-section x2, history of tonsillectomy, LASIK  surgery.  Prenatal course complicated by noncompliance IDDM with estimated fetal weight > 90th percentile, abdominal circumference greater than 90th percentile.  PHYSICAL EXAMINATION:  GENERAL:  She is a well-developed, well- nourished, white female, in no acute distress. HEENT:  Normal. NECK:  Supple.  Full range of motion. LUNGS:  Clear. HEART:  Regular rate and rhythm. ABDOMEN:  Soft, gravid.  Estimated fetal weight __________ pounds. Cervix is closed, __________, vertex, -2. EXTREMITIES:  There are no cords. NEUROLOGIC:  Nonfocal. SKIN:  Intact.  IMPRESSION:  A 39-week intrauterine pregnancy with poorly controlled diabetes. Previous csection x 2  PLAN:  Proceed with repeat low-segment transverse cesarean section. Risks of anesthesia, infection, bleeding, injury to surrounding organs, possible need for repair was discussed.  Delayed versus immediate complications to include bowel and bladder injury are discussed.  The patient acknowledges and wishes to proceed. Possibility of NICU admission for neonate due to poorly controlled IDDM noted.     Lovenia Kim, M.D.     RJT/MEDQ  D:  10/24/2014  T:  10/24/2014  Job:  [id number]

## 2014-10-25 NOTE — Op Note (Signed)
Cesarean Section Procedure Note  Indications: macrosomia, previous uterine incision kerr x2 and IDDM  Pre-operative Diagnosis: 39 week 0 day pregnancy.  Post-operative Diagnosis: same  Surgeon: Lovenia Kim   Assistants: Janann Colonel, CNM  Anesthesia: Local anesthesia 0.25.% bupivacaine and Spinal anesthesia  ASA Class: 2  Procedure Details  The patient was seen in the Holding Room. The risks, benefits, complications, treatment options, and expected outcomes were discussed with the patient.  The patient concurred with the proposed plan, giving informed consent. The risks of anesthesia, infection, bleeding and possible injury to other organs discussed. Injury to bowel, bladder, or ureter with possible need for repair discussed. Possible need for transfusion with secondary risks of hepatitis or HIV acquisition discussed. Post operative complications to include but not limited to DVT, PE and Pneumonia noted. The site of surgery properly noted/marked. The patient was taken to Operating Room # 9, identified as SAVREEN GEBHARDT and the procedure verified as C-Section Delivery. A Time Out was held and the above information confirmed.  After induction of anesthesia, the patient was draped and prepped in the usual sterile manner. A Pfannenstiel incision was made and carried down through the subcutaneous tissue to the fascia. Fascial incision was made and extended transversely using Mayo scissors. The fascia was separated from the underlying rectus tissue superiorly and inferiorly. The peritoneum was identified and entered. Peritoneal incision was extended longitudinally. The utero-vesical peritoneal reflection was incised transversely and the bladder flap was bluntly freed from the lower uterine segment. A low transverse uterine incision(Kerr hysterotomy) was made. Delivered from OA presentation was a  female with Apgar scores of 8 at one minute and 9 at five minutes. Bulb suctioning gently performed.  Neonatal team in attendance.After the umbilical cord was clamped and cut cord blood was obtained for evaluation. The placenta was removed intact and appeared normal. The uterus was curetted with a dry lap pack. Good hemostasis was noted.The uterine outline, tubes and ovaries appeared normal. The uterine incision was closed with running locked sutures of 0 Monocryl x 2 layers. Hemostasis was observed. Omental adhesions to anterior abdominal wall were sharply lysed.The lower rectus fascia was closed with a running 2-0 Monocryl suture. The fascia was then reapproximated with running sutures of 0 Monocryl. The skin was reapproximated with 3-0 Monocryl after [facility] closure with 2-0 plain.  Instrument, sponge, and needle counts were correct prior the abdominal closure and at the conclusion of the case.   Findings: As noted above  Estimated Blood Loss:  300 mL         Drains: foley                 Specimens: placenta                 Complications:  None; patient tolerated the procedure well.         Disposition: PACU - hemodynamically stable.         Condition: stable  Attending Attestation: I performed the procedure.

## 2014-10-25 NOTE — Transfer of Care (Signed)
Immediate Anesthesia Transfer of Care Note  Patient: Tammy Mooney  Procedure(s) Performed: Procedure(s) with comments: Repeat CESAREAN SECTION (N/A) - EDD: 10/31/14  Patient Location: PACU  Anesthesia Type:Spinal  Level of Consciousness: awake, alert  and oriented  Airway & Oxygen Therapy: Patient Spontanous Breathing  Post-op Assessment: Report given to RN and Post -op Vital signs reviewed and stable  Post vital signs: Reviewed and stable  Last Vitals:  Filed Vitals:   10/25/14 1129  BP: 136/90  Pulse: 89  Temp: 36.4 C  Resp: 20    Complications: No apparent anesthesia complications

## 2014-10-25 NOTE — Anesthesia Postprocedure Evaluation (Signed)
Anesthesia Post Note  Patient: Tammy Mooney  Procedure(s) Performed: Procedure(s) (LRB): Repeat CESAREAN SECTION (N/A)  Anesthesia type: Spinal  Patient location: Mother/Baby  Post pain: Pain level controlled  Post assessment: Post-op Vital signs reviewed  Last Vitals:  Filed Vitals:   10/25/14 1710  BP: 114/61  Pulse: 78  Temp: 36.4 C  Resp: 18    Post vital signs: Reviewed  Level of consciousness: awake  Complications: No apparent anesthesia complications

## 2014-10-25 NOTE — Anesthesia Preprocedure Evaluation (Signed)
Anesthesia Evaluation  Patient identified by MRN, date of birth, ID band Patient awake    Reviewed: Allergy & Precautions, H&P , NPO status , Patient's Chart, lab work & pertinent test results  Airway Mallampati: III       Dental no notable dental hx.    Pulmonary neg pulmonary ROS,  breath sounds clear to auscultation  Pulmonary exam normal       Cardiovascular Exercise Tolerance: Good negative cardio ROS  Rhythm:regular Rate:Normal     Neuro/Psych negative neurological ROS  negative psych ROS   GI/Hepatic negative GI ROS, Neg liver ROS,   Endo/Other  negative endocrine ROSdiabetesMorbid obesity  Renal/GU negative Renal ROS  negative genitourinary   Musculoskeletal   Abdominal Normal abdominal exam  (+)   Peds  Hematology negative hematology ROS (+)   Anesthesia Other Findings  H/O varicella      PCOS (polycystic ovarian syndrome)        Diabetes mellitus without complication   glyburide    Reproductive/Obstetrics (+) Pregnancy                             Anesthesia Physical  Anesthesia Plan  ASA: III  Anesthesia Plan: Spinal   Post-op Pain Management:    Induction:   Airway Management Planned:   Additional Equipment:   Intra-op Plan:   Post-operative Plan:   Informed Consent: I have reviewed the patients History and Physical, chart, labs and discussed the procedure including the risks, benefits and alternatives for the proposed anesthesia with the patient or authorized representative who has indicated his/her understanding and acceptance.     Plan Discussed with: Anesthesiologist, CRNA and Surgeon  Anesthesia Plan Comments:         Anesthesia Quick Evaluation

## 2014-10-25 NOTE — Progress Notes (Signed)
Patient ID: SPECIAL RANES, female   DOB: 24-Aug-1980, 34 y.o.   MRN: [id number] Patient seen and examined. Consent witnessed and signed. No changes noted. Update completed.

## 2014-10-25 NOTE — Anesthesia Procedure Notes (Addendum)
Epidural Patient location during procedure: OR  Preanesthetic Checklist Completed: patient identified, site marked, surgical consent, pre-op evaluation, timeout performed, IV checked, risks and benefits discussed and monitors and equipment checked  Epidural Patient position: sitting Prep: site prepped and draped and DuraPrep Patient monitoring: continuous pulse ox and blood pressure Approach: midline Injection technique: LOR air  Needle:  Needle type: Tuohy  Needle gauge: 17 G Needle length: 9 cm and 9 Needle insertion depth: 9 cm Catheter type: closed end flexible Catheter size: 19 Gauge Catheter at skin depth: 16 cm Test dose: negative  Assessment Events: blood not aspirated, injection not painful, no injection resistance, negative IV test and no paresthesia  Additional Notes Sprotte thru touhy Spinal Dosage in OR  Bupivicaine ml       1.6 PFMS04   mcg        100  (-) asp heme/CSF

## 2014-10-26 ENCOUNTER — Encounter (HOSPITAL_COMMUNITY): Payer: Self-pay | Admitting: Obstetrics and Gynecology

## 2014-10-26 LAB — CBC
HEMATOCRIT: 35.6 % — AB (ref 36.0–46.0)
Hemoglobin: 12 g/dL (ref 12.0–15.0)
MCH: 26.8 pg (ref 26.0–34.0)
MCHC: 33.7 g/dL (ref 30.0–36.0)
MCV: 79.6 fL (ref 78.0–100.0)
Platelets: 235 10*3/uL (ref 150–400)
RBC: 4.47 MIL/uL (ref 3.87–5.11)
RDW: 15.9 % — ABNORMAL HIGH (ref 11.5–15.5)
WBC: 10.6 10*3/uL — ABNORMAL HIGH (ref 4.0–10.5)

## 2014-10-26 LAB — GLUCOSE, CAPILLARY
Glucose-Capillary: 147 mg/dL — ABNORMAL HIGH (ref 70–99)
Glucose-Capillary: 156 mg/dL — ABNORMAL HIGH (ref 70–99)
Glucose-Capillary: 146 mg/dL — ABNORMAL HIGH (ref 70–99)

## 2014-10-26 LAB — BIRTH TISSUE RECOVERY COLLECTION (PLACENTA DONATION)

## 2014-10-26 MED ORDER — METFORMIN HCL ER 500 MG PO TB24
1000.0000 mg | ORAL_TABLET | Freq: Two times a day (BID) | ORAL | Status: DC
  Administered 2014-10-26 – 2014-10-27 (×2): 1000 mg via ORAL
  Filled 2014-10-26 (×3): qty 2

## 2014-10-26 NOTE — Progress Notes (Signed)
POSTOPERATIVE DAY # 1 S/P repeat CS  S:         Reports feeling well - no pain at all             Tolerating po intake / no nausea / no vomiting / no flatus / no BM             Bleeding is light             Pain controlled with injected analgesia at time of surgery             Up ad lib / ambulatory/ voiding QS  Newborn breast-feeding  / Circumcision planned             Baby with some low CBG - aware related to DM  O:  VS: BP 107/73 mmHg  Pulse 100  Temp(Src) 98.2 F (36.8 C) (Oral)  Resp 18  SpO2 95%  LMP 01/18/2014  Breastfeeding? Unknown   LABS: post meals yesterday normal range / FBS 134 this am              Recent Labs  10/23/14 1015 10/26/14 0545  WBC 11.3* 10.6*  HGB 13.4 12.0  PLT 303 235               Bloodtype: --/--/A POS (04/04 1015)  Rubella:    Immune                                           I&O: Intake/Output      04/06 0701 - 04/07 0700 04/07 0701 - 04/08 0700   I.V. 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 / type 2 DM pre-existing                                managed as GDMa2 with poor compliance with diet & monitoring  P:        routine postoperative care              adjustment in diet to carb modified with snacks              adjustment in metformin to XR 1000mg  BID at 10 and 10 for better glycemic control & compliance at home             may need to continue insulin  if XR increased metformin dose  inadequate for glycemic control for wound healing             remove contact dressing tomorrow    Artelia Laroche CNM, MSN, [facility] 10/26/2014, 9:58 AM

## 2014-10-26 NOTE — Lactation Note (Signed)
This note was copied from the chart of Tammy Mooney. Lactation Consultation Note  Follow up visit made.  Baby had circumcision 2 hours ago and is sleeping in crib.  Baby unwrapped and attempted waking techniques.  Baby showed minimal feeding cues.  Assisted with positioning baby in the football hold.  Drop of colostrum hand expressed.  Baby not opening mouth and showing little interest in feeding.  Instructed to watch for cues and call for concerns/assist prn.  Patient Name: Tammy Adaleah Forget HDIXB'O Date: 10/26/2014 Reason for consult: Follow-up assessment   Maternal Data    Feeding Feeding Type: Breast Fed  LATCH Score/Interventions Latch: Too sleepy or reluctant, no latch achieved, no sucking elicited. Intervention(s): Skin to skin;Teach feeding cues;Waking techniques Intervention(s): Adjust position;Assist with latch;Breast massage;Breast compression  Audible Swallowing: None  Type of Nipple: Everted at rest and after stimulation  Comfort (Breast/Nipple): Soft / non-tender     Hold (Positioning): Assistance needed to correctly position infant at breast and maintain latch.  LATCH Score: 5  Lactation Tools Discussed/Used     Consult Status Consult Status: Follow-up Date: 10/27/14 Follow-up type: In-patient    Ave Filter 10/26/2014, 2:54 PM

## 2014-10-27 LAB — GLUCOSE, CAPILLARY: GLUCOSE-CAPILLARY: 134 mg/dL — AB (ref 70–99)

## 2014-10-27 LAB — HEMOGLOBIN A1C
Hgb A1c MFr Bld: 6.3 % — ABNORMAL HIGH (ref 4.8–5.6)
Mean Plasma Glucose: 134 mg/dL

## 2014-10-27 MED ORDER — METFORMIN HCL ER (OSM) 1000 MG PO TB24: 1000.0000 mg | ORAL_TABLET | Freq: Two times a day (BID) | ORAL | Status: DC

## 2014-10-27 MED ORDER — OXYCODONE-ACETAMINOPHEN 5-325 MG PO TABS: 1.0000 | ORAL_TABLET | ORAL | Status: DC | PRN

## 2014-10-27 MED ORDER — IBUPROFEN 600 MG PO TABS: 600.0000 mg | ORAL_TABLET | Freq: Four times a day (QID) | ORAL | Status: DC

## 2014-10-27 NOTE — Discharge Summary (Signed)
DISCHARGE SUMMARY:  Patient ID: Tammy Mooney MRN: [id number] DOB/AGE: 1980-10-16 34 y.o.  Admit date: 10/25/2014 Admission Diagnoses: 40.[redacted] weeks gestation, previous CS, IDDM   Discharge date: 10/27/2014 Discharge Diagnoses: S/P C/S on 10/25/14, Type II DM        Prenatal history: G3P2002 EDC: 10/25/2014, by Last Menstrual Period  Prenatal care at [facility] since [redacted] wks gestation. Primary provider: Artelia Laroche, CNM Prenatal course complicated by obesity, previous CS, AMA, IDDM-non compliance, EFW >90%  Prenatal labs: ABO, Rh: --/--/A POS (04/04 1015) Antibody: NEG (04/04 1015) Rubella:   IMMUNE RPR: Non Reactive (04/04 1015)  HBsAg:   Neg HIV:   Neg GBS:   Pos GTT: n/a HgbA1C: 5.7  Medical / Surgical History :  Past medical history:  Past Medical History  Diagnosis Date  . H/O varicella   . PCOS (polycystic ovarian syndrome)   . Infections of genitourinary tract in pregnancy, unspecified as to episode of care(646.60)   . Diabetes mellitus without complication     glyburide    Past surgical history:  Past Surgical History  Procedure Laterality Date  . Cesarean section    . Tonsillectomy    . Eye surgery      lasic  . Nasal sinus surgery    . Cesarean section  07/01/2012    Procedure: CESAREAN SECTION;  Surgeon: Lovenia Kim, MD;  Location: [facility];  Service: Obstetrics;  Laterality: N/A;  Repeat cesarean section with delivery of baby girl at 58. Apgars 9/9.  Marland Kitchen Cesarean section N/A 10/25/2014    Procedure: Repeat CESAREAN SECTION;  Surgeon: Brien Few, MD;  Location: [facility];  Service: Obstetrics;  Laterality: N/A;  EDD: 10/31/14     Medications on Admission: No prescriptions prior to admission    Allergies: Adhesive and Penicillins   Intrapartum Course:  Admitted for scheduled repeat CS under regional anesthesia.   Postpartum Course: Uncomplicated, discharged on POD #2.  Physical Exam:   VSS: Blood pressure 124/56, pulse  98, temperature 98.5 F (36.9 C), temperature source Oral, resp. rate 19, last menstrual period 01/18/2014, SpO2 100 %, unknown if currently breastfeeding.  LABS:  Recent Labs  10/26/14 0545  WBC 10.6*  HGB 12.0  PLT 235    General: alert and oriented x3 Heart: RRR Lungs: CTA bilaterally GI: soft, non-tender, non-distended, BS x4 Lochia: small Uterus: firm below umbilicus Incision: well approximated; honeycomb dressing-no significant erythema, drainage, or edema Extremities: trace BLE edema, Homans neg   Newborn Data Live born female  Birth Weight: 9 lb 7 oz (4280 g) APGAR: 8, 9  See operative report for further details  Home with mother.  Discharge Instructions:  Wound Care: keep clean and dry / remove honeycomb POD 6 Postpartum Instructions: [facility] discharge booklet - instructions reviewed Medications:    Medication List    STOP taking these medications        acetaminophen 500 MG tablet  Commonly known as:  TYLENOL     insulin glargine 100 UNIT/ML injection  Commonly known as:  LANTUS     metFORMIN 500 MG tablet  Commonly known as:  GLUCOPHAGE  Replaced by:  metformin 1000 MG (OSM) 24 hr tablet     phentermine 37.5 MG capsule      TAKE these medications        fluticasone 50 MCG/ACT nasal spray  Commonly known as:  FLONASE  Place 2 sprays into the nose daily.     ibuprofen 600 MG tablet  Commonly known as:  ADVIL,MOTRIN  Take 1 tablet (600 mg total) by mouth every 6 (six) hours.     metformin 1000 MG (OSM) 24 hr tablet  Commonly known as:  FORTAMET  Take 1 tablet (1,000 mg total) by mouth 2 (two) times daily with a meal.     oxyCODONE-acetaminophen 5-325 MG per tablet  Commonly known as:  PERCOCET/ROXICET  Take 1-2 tablets by mouth every 4 (four) hours as needed (for pain scale 4-7).     prenatal multivitamin Tabs tablet  Take 1 tablet by mouth daily at 12 noon.            Follow-up Information    Follow up with Artelia Laroche, CNM.  Schedule an appointment as soon as possible for a visit in 6 weeks.   Specialty:  Obstetrics and Gynecology   Contact information:   [place] [address] [phone]         Signed: Julianne Handler, Delane Ginger MSN, CNM 10/27/2014, 8:32 PM

## 2014-10-27 NOTE — Progress Notes (Signed)
Patient told that she needed to stay 48hrs per ped order. Patient stated she will call the peditrician if she notices and fevers and wants to leave early.. Risk factors explained. Patient verbalized understanding.

## 2014-10-27 NOTE — Progress Notes (Signed)
POD # 2  Subjective: Pt reports feeling well, desires early discharge/ Pain controlled with Motrin and Percocet Tolerating po/Voiding without problems/ No n/v/ Flatus present Activity: ad lib Bleeding is light Newborn info:  Information for the patient's newborn:  Renita, Brocks [[id number]]  female  / Circumcision: done/ Feeding: breast   Objective: VS: VS:  Filed Vitals:   10/26/14 0820 10/26/14 1235 10/26/14 1744 10/27/14 0557  BP: 107/73 113/57 128/75 124/56  Pulse: 100 94 115 98  Temp: 98.2 F (36.8 C) 98.3 F (36.8 C) 98.4 F (36.9 C) 98.5 F (36.9 C)  TempSrc: Oral Oral Oral Oral  Resp: 18 18 18 19   SpO2: 95% 100%  100%    I&O: Intake/Output      04/07 0701 - 04/08 0700 04/08 0701 - 04/09 0700   I.V.     Total Intake       Urine 1200    Blood     Total Output 1200     Net -1200            LABS:  Recent Labs  10/26/14 0545  WBC 10.6*  HGB 12.0  PLT 235   Blood type: --/--/A POS (04/04 1015) Rubella:   Immune       [id number]  Physical Exam:  General: alert and cooperative CV: Regular rate and rhythm Resp: CTA bilaterally Abdomen: soft, nontender, normal bowel sounds Incision: healing well, no drainage, no erythema, no hernia, no seroma, no swelling, well approximated, honeycomb c/d, tegaderm lifting on LL border Uterine Fundus: firm, below umbilicus, nontender Lochia: minimal Ext: extremities normal, atraumatic, no cyanosis or edema and Homans sign is negative, no sign of DVT    Assessment: POD # 2/ G4P2103/ S/P C/Section d/t repeat  Type II DM Doing well and stable for discharge home  Plan: Discharge home RX's: Ibuprofen 600mg  po Q 6 hrs prn pain #30 Refill x 1 Percocet 5/325 1 - 2 tabs po every 4 hrs prn pain #30 Refill x 0 Metformin XL 1000 mg bid #60, refill x1 [facility] booklet given Need to monitor BS at home 4x daily, send values to primary via portal early next week    Signed: Julianne Handler, N, MSN,  CNM 10/27/2014, 9:24 AM

## 2015-01-05 ENCOUNTER — Telehealth: Payer: Self-pay | Admitting: Family

## 2015-01-05 NOTE — Telephone Encounter (Signed)
I use that first visit to get to know the patient. We do not have time to do both in one visit.

## 2015-01-05 NOTE — Telephone Encounter (Signed)
Pt does not want to come in to est. Pt would like to have cpx w/cory can I sch?

## 2015-01-08 NOTE — Telephone Encounter (Signed)
Pt will call back

## 2015-01-15 ENCOUNTER — Other Ambulatory Visit: Payer: Self-pay

## 2015-08-11 ENCOUNTER — Telehealth: Payer: 59 | Admitting: Nurse Practitioner

## 2015-08-11 DIAGNOSIS — R059 Cough, unspecified: Secondary | ICD-10-CM

## 2015-08-11 DIAGNOSIS — R05 Cough: Secondary | ICD-10-CM

## 2015-08-11 MED ORDER — AZITHROMYCIN 250 MG PO TABS: ORAL_TABLET | ORAL | Status: DC

## 2015-08-11 NOTE — Addendum Note (Signed)
Addended by: Chevis Pretty on: 08/11/2015 07:27 PM   Modules accepted: Orders

## 2015-08-11 NOTE — Progress Notes (Signed)

## 2015-08-11 NOTE — Progress Notes (Signed)

## 2015-08-15 DIAGNOSIS — L918 Other hypertrophic disorders of the skin: Secondary | ICD-10-CM | POA: Diagnosis not present

## 2015-08-15 DIAGNOSIS — Z419 Encounter for procedure for purposes other than remedying health state, unspecified: Secondary | ICD-10-CM | POA: Diagnosis not present

## 2015-08-15 DIAGNOSIS — L718 Other rosacea: Secondary | ICD-10-CM | POA: Diagnosis not present

## 2015-08-15 MED FILL — MIRVASO 0.33% GEL PUMP: 0.33 | 30 days supply | Qty: 30 | Fill #0

## 2015-08-17 MED FILL — LEVOTHYROXINE 88 MCG TABLET: 88 | 30 days supply | Qty: 30 | Fill #0

## 2015-08-27 DIAGNOSIS — Z0100 Encounter for examination of eyes and vision without abnormal findings: Secondary | ICD-10-CM | POA: Diagnosis not present

## 2015-08-27 DIAGNOSIS — Z30431 Encounter for routine checking of intrauterine contraceptive device: Secondary | ICD-10-CM | POA: Diagnosis not present

## 2015-08-27 DIAGNOSIS — R7301 Impaired fasting glucose: Secondary | ICD-10-CM | POA: Diagnosis not present

## 2015-08-27 DIAGNOSIS — E039 Hypothyroidism, unspecified: Secondary | ICD-10-CM | POA: Diagnosis not present

## 2015-08-31 MED FILL — METFORMIN HCL ER 750 MG TAB: 750 | 90 days supply | Qty: 180 | Fill #0

## 2015-08-31 MED FILL — SPIRONOLACTONE 50 MG TABLET: 50 | 90 days supply | Qty: 90 | Fill #0

## 2015-09-03 ENCOUNTER — Telehealth: Payer: Self-pay | Admitting: Family

## 2015-09-03 NOTE — Telephone Encounter (Signed)
Pt would like to transfer to dr panosh. Can I sch? °

## 2015-09-10 NOTE — Telephone Encounter (Signed)
Not taking new patients because of full  Patient Panel .

## 2015-09-12 NOTE — Telephone Encounter (Signed)
Unable to leave message voicemail not set up yet

## 2015-09-14 NOTE — Telephone Encounter (Signed)
Pt will wait on amy [place]

## 2015-09-26 MED FILL — LEVOTHYROXINE 88 MCG TABLET: 88 | 30 days supply | Qty: 30 | Fill #0

## 2015-11-07 MED FILL — LEVOTHYROXINE 88 MCG TABLET: 88 | 30 days supply | Qty: 30 | Fill #1

## 2015-11-13 ENCOUNTER — Ambulatory Visit (INDEPENDENT_AMBULATORY_CARE_PROVIDER_SITE_OTHER): Payer: 59 | Admitting: Family Medicine

## 2015-11-13 ENCOUNTER — Encounter: Payer: Self-pay | Admitting: Family Medicine

## 2015-11-13 VITALS — BP 100/60 | HR 91 | Temp 97.6°F | Resp 12 | Ht 66.54 in | Wt 263.1 lb

## 2015-11-13 DIAGNOSIS — R600 Localized edema: Secondary | ICD-10-CM

## 2015-11-13 DIAGNOSIS — Z6841 Body Mass Index (BMI) 40.0 and over, adult: Secondary | ICD-10-CM

## 2015-11-13 DIAGNOSIS — E119 Type 2 diabetes mellitus without complications: Secondary | ICD-10-CM | POA: Diagnosis not present

## 2015-11-13 DIAGNOSIS — E039 Hypothyroidism, unspecified: Secondary | ICD-10-CM | POA: Diagnosis not present

## 2015-11-13 DIAGNOSIS — F419 Anxiety disorder, unspecified: Secondary | ICD-10-CM | POA: Diagnosis not present

## 2015-11-13 MED ORDER — LEVOTHYROXINE SODIUM 88 MCG PO TABS: 88.0000 ug | ORAL_TABLET | Freq: Every day | ORAL | Status: DC

## 2015-11-13 MED ORDER — METFORMIN HCL ER 750 MG PO TB24: 1500.0000 mg | ORAL_TABLET | Freq: Every day | ORAL | Status: DC

## 2015-11-13 MED ORDER — BUSPIRONE HCL 5 MG PO TABS: 5.0000 mg | ORAL_TABLET | Freq: Two times a day (BID) | ORAL | Status: DC | PRN

## 2015-11-13 MED FILL — busPIRone HCL 5 MG TABS: 5 | 30 days supply | Qty: 60 | Fill #0

## 2015-11-13 NOTE — Patient Instructions (Addendum)
A few things to remember from today's visit:   1. Primary hypothyroidism  - levothyroxine (SYNTHROID, LEVOTHROID) 88 MCG tablet; Take 1 tablet (88 mcg total) by mouth daily before breakfast.  Dispense: 90 tablet; Refill: 1  2. BMI 40.0-44.9, adult [facility])  What are some tips for weight loss? People become overweight for many reasons. Weight issues can run in families. They can be caused by unhealthy behaviors and a person's environment. Certain health problems and medicines can also lead to weight gain. There are some simple things you can do to reach and maintain a healthy weight:  Eat 500 fewer calories per day than your body needs to maintain your weight. Women should aim for no more than 1,200 to 1,500 calories per day. Men should aim for 1,500 to 1,800 calories per day. Avoid sweet drinks. These include regular soft drinks, fruit juices, fruit drinks, energy drinks, sweetened iced tea, and flavored milk. Avoid fast foods. Fast foods such as french fries, hamburgers, chicken nuggets, and pizza are high in calories and can cause weight gain. Eat a healthy breakfast. People who skip breakfast tend to weigh more. Don't watch more than two hours of television per day. Chew sugar-free gum between meals to cut down on snacking. Avoid grocery shopping when you're hungry. Pack a healthy lunch instead of eating out to control what and how much you eat. Eat a lot of fruits and vegetables. Aim for about 2 cups of fruit and 2 to 3 cups of vegetables per day. Aim for 150 minutes per week of moderate-intensity exercise (such as brisk walking), or 75 minutes per week of vigorous exercise (such as jogging or running). Be more active. Small changes in physical activity can easily be added to your daily routine. For example, take the stairs instead of the elevator. Take a walk with your family. A daily walk is a great way to get exercise and to catch up on the day's events.   3. Anxiety disorder,  unspecified  - busPIRone (BUSPAR) 5 MG tablet; Take 1 tablet (5 mg total) by mouth 2 (two) times daily as needed.  Dispense: 60 tablet; Refill: 1      If you sign-up for My chart, you can communicate easier with [name] in case you have any question or concern.        Pre visit review using our clinic review tool, if applicable. No additional management support is needed unless otherwise documented below in the visit note.

## 2015-11-13 NOTE — Progress Notes (Addendum)
HPI:  Ms. Tammy Mooney is a 35 y.o.female , who is here today to establish care with me.  She has been following with her gynecology, she is going to retire so she decided to reestablish with a primary care physician. She lives with her husband and 3 children, delivered her last child about a year ago. On Mirena.  She history of DM II,hypothyroidism, anxiety,allergic rhinitis,and obesity.   Has the following chronic problems that require follow up and concerns today:  She is trying to lose wt.,has not been consistent with a healthy diet and regular exercise; about a week ago she started walking, "slowly" a few times per week. She also started watching her carbs and sugar added foods for for the past week.   Diabetes Mellitus II: Dx during pregnancy about a year ago. Hx of gestational diabetes and PCOS. She was on insulin during her pregnancy. She is currently on Metformin ER 750 mg 2 tabs daily at night. Problem has been stable. She is checking BS's, denies any hypoglycemia. She is tolerating medication well. She denies abdominal pain, nausea, vomiting, polydipsia, polyuria, or polyphagia. No numbness, tingling, or burning.   Most recent labs in EPIC:  Lab Results  Component Value Date   CREATININE 0.59 10/23/2014   BUN 12 10/23/2014   NA 135 10/23/2014   K 3.5 10/23/2014   CL 105 10/23/2014   CO2 20 10/23/2014    Lab Results  Component Value Date   HGBA1C 6.3* 10/26/2014    She had a periaortic eye exam, last one about a month ago, no signs of diabetic retinopathy. Reporting last labs done about 2 months ago, 08/27/15, HgA1C 6.2.    Hypothyroidism: Reporting TSH in 08/2015, 3.5. Currently she is on levothyroxine 88 g daily. She denies any abnormal weight changes, cold/heat intolerance, constipation/diarrhea, palpitations, tremor, or weakness.  Lower extremity edema: On Spironolactone 25 mg for edema, not sure if it is helping. She has not had K+  checked. Edema usually starts a few hours after getting up, pedal, worse at the end of the day , L>R. No associated erythema or leg pain.   Anxiety: Depression during pregnancy and anxiety, no prior Hx of depression. She has had some anxiety for years, attributed to stress, but able to manage it. She denies any psychotic hospitalization, history of bipolar disorder or suicidal thoughts. Anxiety seems to get worse after her last pregnancy, since then she has been on Buspar 5 mg once daily as needed, it was prescribed by her gynecologist prn and she feels like it helps.  She doesn't feel like she needs to take something daily. She is tolerating medication well and denies any side effects.    REVIEW OF SYSTEMS:  General: Negative for fever, fatigue, abnormal weight loss, or changes in appetite.  Eyes: Negative for conjunctival erythema, or visual changes.  ENT: Negative for earache, hearing loss, or ear drainage. + intermittent rhinorrhea and nasal congestion.  No sinus pain or epistaxis. Negative for oral lesions, odynophagia, dysphagia.  Neck: negative for swollen glands or masses.  Cardiac: Negative for chest pain, exertional dyspnea, irregular HR, claudication, cold extremities, or worsening LE edema.  Respiratory: Negative for cough, dyspnea, wheezing, or hemoptysis.  Abdomen: Negative for abdominal pain, changes in bowel habits, blood in stool or melena, nausea, or vomiting.  GU: Negative for dysuria, urinary frequency, urinary urgency, gross hematuria, urine incontinence.  MS: No arthralgias, joint erythema or edema, joint stiffness, or limitation of ROM.  Neurologic:  No confusion,  focal weakness, numbness or tingling, frequent/severe headaches, or tremor.  Psychiatric: No depression or sleep disorder. + Anxiety. Denies suicidal thoughts or maniac symptoms.  Skin: Negative for rash, ulcers, or skin color changes.  Endocrine: Denies cold or heat intolerance, polydipsia,  polyuria, polyphagia, palpitations.    Past Medical History  Diagnosis Date  . H/O varicella   . PCOS (polycystic ovarian syndrome)   . Infections of genitourinary tract in pregnancy, unspecified as to episode of care(646.60)   . Diabetes mellitus without complication ([place])     glyburide    Past Surgical History  Procedure Laterality Date  . Cesarean section    . Tonsillectomy    . Eye surgery      lasic  . Nasal sinus surgery    . Cesarean section  07/01/2012    Procedure: CESAREAN SECTION;  Surgeon: Lovenia Kim, MD;  Location: [facility];  Service: Obstetrics;  Laterality: N/A;  Repeat cesarean section with delivery of baby girl at 35. Apgars 9/9.  Marland Kitchen Cesarean section N/A 10/25/2014    Procedure: Repeat CESAREAN SECTION;  Surgeon: Brien Few, MD;  Location: [facility];  Service: Obstetrics;  Laterality: N/A;  EDD: 10/31/14    Family History  Problem Relation Age of Onset  . Diabetes Mother   . Heart attack Father   . Heart disease Maternal Grandfather   . Diabetes Paternal Grandmother     Social History   Social History  . Marital Status: Married    Spouse Name: N/A  . Number of Children: N/A  . Years of Education: N/A   Social History Main Topics  . Smoking status: Never Smoker   . Smokeless tobacco: Never Used  . Alcohol Use: No  . Drug Use: No  . Sexual Activity: Yes   Other Topics Concern  . None   Social History Narrative    Current Outpatient Prescriptions on File Prior to Visit  Medication Sig Dispense Refill  . fluticasone (FLONASE) 50 MCG/ACT nasal spray Place 2 sprays into the nose daily. 16 g 6  . ibuprofen (ADVIL,MOTRIN) 600 MG tablet Take 1 tablet (600 mg total) by mouth every 6 (six) hours. 30 tablet 0  . Prenatal Vit-Fe Fumarate-FA (PRENATAL MULTIVITAMIN) TABS tablet Take 1 tablet by mouth daily at 12 noon.     No current facility-administered medications on file prior to visit.     EXAM:  Filed Vitals:   11/13/15 0818  BP: 100/60    Pulse: 91  Temp: 97.6 F (36.4 C)  Resp: 12    Body mass index is 41.78 kg/(m^2).    GENERAL: vitals reviewed and listed above. Well developed, appears well hydrated and in no acute distress.  ENT: atraumatic, conjunttiva clear,PERRL,EOMI bilateral. Oral mucosa mildly dry, no lesions. No obvious abnormalities on inspection of external nose and ears.  NECK: no obvious masses on inspection.No lymphadenopathy, mild thyromegaly, no nodules appreciated.  LUNGS: clear to auscultation bilaterally, no wheezes, rales or rhonchi, good air movement  CV: Regular rate and rhythm, no murmurs appreciated, no peripheral edema  ABDOMEN: Soft, no tender, no masses or hepatomegaly appreciated.  MS: moves all extremities without noticeable abnormality  FOOT Exam: Monofilament exam normal, no deformities, normal DP and PT pulses bilateral.  NEURO: Alert and oriented x 3, no focal deficit appreciated, normal gait.  PSYCH: pleasant and cooperative, no obvious depression or anxiety. Well groomed and good eye contact.   ASSESSMENT AND PLAN:  Discussed the following assessment and plan:  Controlled type 2 diabetes mellitus without complication, without long-term current use of insulin ([place])   - Plan: metFORMIN (GLUCOPHAGE XR) 750 MG 24 hr tablet  HgA1C at goal. No changes in current management. Regular exercise and healthy diet with avoidance of added sugar food intake is an important part of treatment and recommended. Annual eye exam, periodic dental and foot care recommended. F/U in 4 months  Primary hypothyroidism  Stable. Reporting U/S of thyroid done a few years ago and "fine", will try to obtain report. No new recommendations today. No changes in current management. F/U in 4 months.  - Plan: levothyroxine (SYNTHROID, LEVOTHROID) 88 MCG tablet  BMI 40.0-44.9, adult ([name])  We discussed benefits of wt loss as well as adverse effects of obesity. Consistency with healthy diet and  physical activity recommended. Weight Watchers is a good option as well as daily brisk walking for 15-30 min as tolerated.   Anxiety disorder, unspecified Stable.  No changes in current management. F/U in 6-12 months.   - Plan: busPIRone (BUSPAR) 5 MG tablet  Bilateral edema lower extremity   Mild. Side effects of Spironolactone discussed and sine she doe snot feel like it is helping much she agrees with stopping medication. LE elevation a few times per day. F/U as needed.   -We reviewed the PMH, PSH, FH, SH, Meds and Allergies. -We provided refills for any medications we will prescribe as needed. -We addressed current concerns per orders and patient instructions. -We have asked for records for pertinent exams, studies, vaccines and notes from previous providers.    -Patient advised to return or notify a doctor immediately if symptoms worsen or persist or new concerns arise.      Edvin Albus G. Martinique, MD  [facility]. [place] office.

## 2015-12-07 ENCOUNTER — Encounter: Payer: Self-pay | Admitting: Family Medicine

## 2015-12-07 ENCOUNTER — Other Ambulatory Visit: Payer: Self-pay | Admitting: Family Medicine

## 2015-12-11 ENCOUNTER — Other Ambulatory Visit: Payer: Self-pay | Admitting: Family Medicine

## 2015-12-11 DIAGNOSIS — E039 Hypothyroidism, unspecified: Secondary | ICD-10-CM

## 2015-12-11 MED ORDER — LEVOTHYROXINE SODIUM 88 MCG PO TABS: 88.0000 ug | ORAL_TABLET | Freq: Every day | ORAL | Status: DC

## 2015-12-11 MED FILL — LEVOTHYROXINE 88 MCG TABLET: 88 | 90 days supply | Qty: 90 | Fill #0

## 2015-12-31 MED FILL — METFORMIN HCL ER 750 MG TAB: 750 | 90 days supply | Qty: 180 | Fill #1

## 2016-01-03 ENCOUNTER — Ambulatory Visit (INDEPENDENT_AMBULATORY_CARE_PROVIDER_SITE_OTHER): Payer: 59 | Admitting: Family Medicine

## 2016-01-03 ENCOUNTER — Encounter: Payer: Self-pay | Admitting: Family Medicine

## 2016-01-03 VITALS — BP 110/70 | HR 90 | Temp 97.5°F | Resp 12 | Ht 66.0 in | Wt 261.0 lb

## 2016-01-03 DIAGNOSIS — J309 Allergic rhinitis, unspecified: Secondary | ICD-10-CM | POA: Diagnosis not present

## 2016-01-03 DIAGNOSIS — E039 Hypothyroidism, unspecified: Secondary | ICD-10-CM | POA: Diagnosis not present

## 2016-01-03 DIAGNOSIS — E119 Type 2 diabetes mellitus without complications: Secondary | ICD-10-CM

## 2016-01-03 LAB — COMPREHENSIVE METABOLIC PANEL
ALBUMIN: 4.3 g/dL (ref 3.5–5.2)
ALK PHOS: 67 U/L (ref 39–117)
ALT: 47 U/L — AB (ref 0–35)
AST: 22 U/L (ref 0–37)
BILIRUBIN TOTAL: 0.4 mg/dL (ref 0.2–1.2)
BUN: 13 mg/dL (ref 6–23)
CALCIUM: 9.7 mg/dL (ref 8.4–10.5)
CHLORIDE: 104 meq/L (ref 96–112)
CO2: 26 mEq/L (ref 19–32)
CREATININE: 0.71 mg/dL (ref 0.40–1.20)
GFR: 98.88 mL/min (ref >60.00)
Glucose, Bld: 147 mg/dL — ABNORMAL HIGH (ref 70–99)
Potassium: 3.8 mEq/L (ref 3.5–5.1)
SODIUM: 138 meq/L (ref 135–145)
TOTAL PROTEIN: 7 g/dL (ref 6.0–8.3)

## 2016-01-03 LAB — TSH: TSH: 2.87 u[IU]/mL (ref 0.35–4.50)

## 2016-01-03 LAB — HEMOGLOBIN A1C: HEMOGLOBIN A1C: 5.8 % (ref 4.6–6.5)

## 2016-01-03 LAB — MICROALBUMIN / CREATININE URINE RATIO
Creatinine,U: 37.8 mg/dL
Microalb Creat Ratio: 1.9 mg/g (ref 0.0–30.0)

## 2016-01-03 LAB — T4, FREE: FREE T4: 1.08 ng/dL (ref 0.60–1.60)

## 2016-01-03 MED ORDER — FLUTICASONE PROPIONATE 50 MCG/ACT NA SUSP: 2.0000 | Freq: Every day | NASAL | Status: DC

## 2016-01-03 NOTE — Progress Notes (Signed)
Pre visit review using our clinic review tool, if applicable. No additional management support is needed unless otherwise documented below in the visit note. 

## 2016-01-03 NOTE — Patient Instructions (Addendum)
A few things to remember from today's visit:   1. Allergic rhinitis, unspecified allergic rhinitis type  - fluticasone (FLONASE) 50 MCG/ACT nasal spray; Place 2 sprays into both nostrils daily.  Dispense: 16 g; Refill: 6 - Comprehensive metabolic panel  2. Primary hypothyroidism  - TSH - T4, free  3. Controlled type 2 diabetes mellitus without complication, without long-term current use of insulin ([name])  - Hemoglobin A1c - Microalbumin/Creatinine Ratio, Urine   No changes today. Continue with healthy diet and regular exercise.   If you sign-up for My chart, you can communicate easier with [name] in case you have any question or concern.

## 2016-01-03 NOTE — Progress Notes (Signed)
HPI:   [name] is a 35 y.o. female, who is here today to follow on some of her chronic medical problems. I saw her last on April 25/2017, when she established care with me.  Since her last visit she started exercising regularly and started following Weight Watchers diet, she states that she's feeling much better, she has not needed her anxiety medication (takes Buspar as needed), and her lower extremity edema has resolved.    Diabetes Mellitus II: she is currently onMetformin. Problem has been stable. She is checking BS's, denies any hypoglycemia. FG 110-115. She is tolerating medications well. She denies abdominal pain, nausea, vomiting, polydipsia, polyuria, or polyphagia. No numbness, tingling, or burning.     Lab Results  Component Value Date   CREATININE 0.59 10/23/2014   BUN 12 10/23/2014   NA 135 10/23/2014   K 3.5 10/23/2014   CL 105 10/23/2014   CO2 20 10/23/2014    Lab Results  Component Value Date   HGBA1C 6.3* 10/26/2014     Hypothyroidism:   Currently she is on Levothyroxine. Tolerating medication well, no side effects reported. She has not noted dysphagia, palpitations, abdominal pain, changes in bowel habits, tremor, cold/heat intolerance, or abnormal weight loss.  Lab Results  Component Value Date   TSH 3.58 05/20/2013    Allergies: Frontal pressure headache and nasal congestion. She takes OTC antihistaminic as needed. Requesting refills on Flonase nasal spray. No fever, chills, myalgias, or fatigue. No sick contact.   Review of Systems  Constitutional: Negative for fever, activity change, appetite change, fatigue and unexpected weight change.  HENT: Positive for congestion, rhinorrhea, sinus pressure and sneezing. Negative for facial swelling, mouth sores, nosebleeds and trouble swallowing.   Eyes: Positive for itching. Negative for redness and visual disturbance.  Respiratory: Negative for cough, shortness of breath  and wheezing.   Cardiovascular: Negative for chest pain, palpitations and leg swelling.  Gastrointestinal: Negative for nausea, vomiting and abdominal pain.       Negative for changes in bowel habits.  Endocrine: Negative for polydipsia, polyphagia and polyuria.  Genitourinary: Negative for dysuria, hematuria, decreased urine volume and difficulty urinating.  Skin: Negative for rash.  Allergic/Immunologic: Positive for environmental allergies.  Neurological: Negative for syncope, weakness, numbness and headaches.  Psychiatric/Behavioral: Negative for confusion and sleep disturbance. The patient is not nervous/anxious.       Current Outpatient Prescriptions on File Prior to Visit  Medication Sig Dispense Refill  . busPIRone (BUSPAR) 5 MG tablet Take 1 tablet (5 mg total) by mouth 2 (two) times daily as needed. 60 tablet 1  . ibuprofen (ADVIL,MOTRIN) 600 MG tablet Take 1 tablet (600 mg total) by mouth every 6 (six) hours. 30 tablet 0  . levothyroxine (SYNTHROID, LEVOTHROID) 88 MCG tablet Take 1 tablet (88 mcg total) by mouth daily before breakfast. 90 tablet 1  . metFORMIN (GLUCOPHAGE XR) 750 MG 24 hr tablet Take 2 tablets (1,500 mg total) by mouth daily with breakfast. 120 tablet 0  . MIRVASO 0.33 % GEL   2  . Prenatal Vit-Fe Fumarate-FA (PRENATAL MULTIVITAMIN) TABS tablet Take 1 tablet by mouth daily at 12 noon.     No current facility-administered medications on file prior to visit.     Past Medical History  Diagnosis Date  . H/O varicella   . PCOS (polycystic ovarian syndrome)   . Infections of genitourinary tract in pregnancy, unspecified as to episode of care(646.60)   . Diabetes mellitus without complication (  [place])     glyburide   Allergies  Allergen Reactions  . Adhesive [Tape] Rash and Other (See Comments)    Plastic tape causes rash, paper tape is okay.  Marland Kitchen Penicillins Rash    Social History   Social History  . Marital Status: Married    Spouse Name: N/A  . Number  of Children: N/A  . Years of Education: N/A   Social History Main Topics  . Smoking status: Never Smoker   . Smokeless tobacco: Never Used  . Alcohol Use: No  . Drug Use: No  . Sexual Activity: Yes   Other Topics Concern  . None   Social History Narrative    Filed Vitals:   01/03/16 1356  BP: 110/70  Pulse: 90  Temp: 97.5 F (36.4 C)  Resp: 12   Body mass index is 42.15 kg/(m^2).    Physical Exam  Constitutional: She is oriented to person, place, and time. She appears well-developed. No distress.  HENT:  Head: Atraumatic.  Eyes: Conjunctivae are normal.  Neck: Thyromegaly (Mild, no nodules.) present.  Cardiovascular: Normal rate and regular rhythm.   No murmur heard. Pulses:      Dorsalis pedis pulses are 2+ on the right side, and 2+ on the left side.  Respiratory: Effort normal and breath sounds normal. No respiratory distress.  GI: Soft. She exhibits no mass. There is no tenderness.  Musculoskeletal: She exhibits no edema.  Lymphadenopathy:    She has no cervical adenopathy.  Neurological: She is alert and oriented to person, place, and time. She has normal strength. Coordination normal.  Skin: Skin is warm. No erythema.  Psychiatric: She has a normal mood and affect.  Well groomed, good eye contact.      ASSESSMENT AND PLAN:     Tammy Mooney was seen today for new patient (initial visit).  Diagnoses and all orders for this visit:  Controlled type 2 diabetes mellitus without complication, without long-term current use of insulin ([name])  No changes in current management, will follow labs done today and will give further recommendations accordingly. Eye exam, foot care, and periodic dental exam recommended. Encouraged to continue healthy diet and regular exercise.  F/U in 5-6 months.   -     Hemoglobin A1c -     Microalbumin/Creatinine Ratio, Urine  -     Comprehensive metabolic panel  Primary hypothyroidism  No changes in current management, will  follow labs done today and will give further recommendations accordingly. F/U in 6-12 months.  -     TSH -     T4, free  Allergic rhinitis, unspecified allergic rhinitis type  OTC daily antihistaminic, Zyrtec 10 mg. Flonase nasal spray refilled. F/U as needed.  -     fluticasone (FLONASE) 50 MCG/ACT nasal spray; Place 2 sprays into both nostrils daily.         -She was advised to return sooner than planned  if new concerns arise.       Ezmeralda Stefanick G. Martinique, MD  [facility]. [place] office.

## 2016-01-05 ENCOUNTER — Encounter: Payer: Self-pay | Admitting: Family Medicine

## 2016-03-11 MED FILL — LEVOTHYROXINE 88 MCG TABLET: 88 | 90 days supply | Qty: 90 | Fill #1

## 2016-04-01 MED FILL — METFORMIN HCL ER 750 MG TAB: 750 | 90 days supply | Qty: 180 | Fill #2

## 2016-04-08 DIAGNOSIS — J301 Allergic rhinitis due to pollen: Secondary | ICD-10-CM | POA: Diagnosis not present

## 2016-04-08 DIAGNOSIS — J329 Chronic sinusitis, unspecified: Secondary | ICD-10-CM | POA: Diagnosis not present

## 2016-04-08 DIAGNOSIS — J342 Deviated nasal septum: Secondary | ICD-10-CM | POA: Diagnosis not present

## 2016-04-08 DIAGNOSIS — R0981 Nasal congestion: Secondary | ICD-10-CM | POA: Diagnosis not present

## 2016-04-22 DIAGNOSIS — R0981 Nasal congestion: Secondary | ICD-10-CM | POA: Diagnosis not present

## 2016-04-22 DIAGNOSIS — J301 Allergic rhinitis due to pollen: Secondary | ICD-10-CM | POA: Diagnosis not present

## 2016-04-24 DIAGNOSIS — Z7689 Persons encountering health services in other specified circumstances: Secondary | ICD-10-CM | POA: Diagnosis not present

## 2016-04-24 DIAGNOSIS — W461XXA Contact with contaminated hypodermic needle, initial encounter: Secondary | ICD-10-CM | POA: Diagnosis not present

## 2016-05-06 DIAGNOSIS — J329 Chronic sinusitis, unspecified: Secondary | ICD-10-CM | POA: Diagnosis not present

## 2016-05-06 DIAGNOSIS — J343 Hypertrophy of nasal turbinates: Secondary | ICD-10-CM | POA: Diagnosis not present

## 2016-05-30 ENCOUNTER — Encounter: Payer: Self-pay | Admitting: *Deleted

## 2016-06-03 NOTE — Discharge Instructions (Signed)
[facility] °[facility] °ENDOSCOPIC SINUS SURGERY °[facility] ° °What is Functional Endoscopic Sinus Surgery? ° The Surgery involves making the natural openings of the sinuses larger by removing the bony partitions that separate the sinuses from the nasal cavity.  The natural sinus lining is preserved as much as possible to allow the sinuses to resume normal function after the surgery.  In some patients nasal polyps (excessively swollen lining of the sinuses) may be removed to relieve obstruction of the sinus openings.  The surgery is performed through the nose using lighted scopes, which eliminates the need for incisions on the face.  A septoplasty is a different procedure which is sometimes performed with sinus surgery.  It involves straightening the boy partition that separates the two sides of your nose.  A crooked or deviated septum may need repair if is obstructing the sinuses or nasal airflow.  Turbinate reduction is also often performed during sinus surgery.  The turbinates are bony proturberances from the side walls of the nose which swell and can obstruct the nose in patients with sinus and allergy problems.  Their size can be surgically reduced to help relieve nasal obstruction. ° °What Can Sinus Surgery Do For Me? ° Sinus surgery can reduce the frequency of sinus infections requiring antibiotic treatment.  This can provide improvement in nasal congestion, post-nasal drainage, facial pressure and nasal obstruction.  Surgery will NOT prevent you from ever having an infection again, so it usually only for patients who get infections 4 or more times yearly requiring antibiotics, or for infections that do not clear with antibiotics.  It will not cure nasal allergies, so patients with allergies may still require medication to treat their allergies after surgery. Surgery may improve headaches related to sinusitis, however, some people will continue to  require medication to control sinus headaches related to allergies.  Surgery will do nothing for other forms of headache (migraine, tension or cluster). ° °What Are the Risks of Endoscopic Sinus Surgery? ° Current techniques allow surgery to be performed safely with little risk, however, there are rare complications that patients should be aware of.  Because the sinuses are located around the eyes, there is risk of eye injury, including blindness, though again, this would be quite rare. This is usually a result of bleeding behind the eye during surgery, which puts the vision oat risk, though there are treatments to protect the vision and prevent permanent disrupted by surgery causing a leak of the spinal fluid that surrounds the brain.  More serious complications would include bleeding inside the brain cavity or damage to the brain.  Again, all of these complications are uncommon, and spinal fluid leaks can be safely managed surgically if they occur.  The most common complication of sinus surgery is bleeding from the nose, which may require packing or cauterization of the nose.  Continued sinus have polyps may experience recurrence of the polyps requiring revision surgery.  Alterations of sense of smell or injury to the tear ducts are also rare complications.  ° °What is the Surgery Like, and what is the Recovery? ° The Surgery usually takes a couple of hours to perform, and is usually performed under a general anesthetic (completely asleep).  Patients are usually discharged home after a couple of hours.  Sometimes during surgery it is necessary to pack the nose to control bleeding, and the packing is left in place for 24 - 48 hours, and removed by your surgeon.    If a septoplasty was performed during the procedure, there is often a splint placed which must be removed after 5-7 days.   °Discomfort: Pain is usually mild to moderate, and can be controlled by prescription pain medication or acetaminophen (Tylenol).   Aspirin, Ibuprofen (Advil, Motrin), or Naprosyn (Aleve) should be avoided, as they can cause increased bleeding.  Most patients feel sinus pressure like they have a bad head cold for several days.  Sleeping with your head elevated can help reduce swelling and facial pressure, as can ice packs over the face.  A humidifier may be helpful to keep the mucous and blood from drying in the nose.  ° °Diet: There are no specific diet restrictions, however, you should generally start with clear liquids and a light diet of bland foods because the anesthetic can cause some nausea.  Advance your diet depending on how your stomach feels.  Taking your pain medication with food will often help reduce stomach upset which pain medications can cause. ° °Nasal Saline Irrigation: It is important to remove blood clots and dried mucous from the nose as it is healing.  This is done by having you irrigate the nose at least 3 - 4 times daily with a salt water solution.  We recommend using NeilMed Sinus Rinse (available at the drug store).  Fill the squeeze bottle with the solution, bend over a sink, and insert the tip of the squeeze bottle into the nose ½ of an inch.  Point the tip of the squeeze bottle towards the inside corner of the eye on the same side your irrigating.  Squeeze the bottle and gently irrigate the nose.  If you bend forward as you do this, most of the fluid will flow back out of the nose, instead of down your throat.   The solution should be warm, near body temperature, when you irrigate.   Each time you irrigate, you should use a full squeeze bottle.  ° °Note that if you are instructed to use Nasal Steroid Sprays at any time after your surgery, irrigate with saline BEFORE using the steroid spray, so you do not wash it all out of the nose. °Another product, Nasal Saline Gel (such as AYR Nasal Saline Gel) can be applied in each nostril 3 - 4 times daily to moisture the nose and reduce scabbing or crusting. ° °Bleeding:   Bloody drainage from the nose can be expected for several days, and patients are instructed to irrigate their nose frequently with salt water to help remove mucous and blood clots.  The drainage may be dark red or brown, though some fresh blood may be seen intermittently, especially after irrigation.  Do not blow you nose, as bleeding may occur. If you must sneeze, keep your mouth open to allow air to escape through your mouth. ° °If heavy bleeding occurs: Irrigate the nose with saline to rinse out clots, then spray the nose 3 - 4 times with Afrin Nasal Decongestant Spray.  The spray will constrict the blood vessels to slow bleeding.  Pinch the lower half of your nose shut to apply pressure, and lay down with your head elevated.  Ice packs over the nose may help as well. If bleeding persists despite these measures, you should notify your doctor.  Do not use the Afrin routinely to control nasal congestion after surgery, as it can result in worsening congestion and may affect healing.  ° °Activity: Return to work varies among patients. Most patients will be out of   work at least 5 - 7 days to recover.  Patient may return to work after they are off of narcotic pain medication, and feeling well enough to perform the functions of their job.  Patients must avoid heavy lifting (over 10 pounds) or strenuous physical for 2 weeks after surgery, so your employer may need to assign you to light duty, or keep you out of work longer if light duty is not possible.  NOTE: you should not drive, operate dangerous machinery, do any mentally demanding tasks or make any important legal or financial decisions while on narcotic pain medication and recovering from the general anesthetic.  °  °Call Your Doctor Immediately if You Have Any of the Following: °1. Bleeding that you cannot control with the above measures °2. Loss of vision, double vision, bulging of the eye or black eyes. °3. Fever over 101 degrees °4. Neck stiffness with severe  headache, fever, nausea and change in mental state. °You are always encourage to call anytime with concerns, however, please call with requests for pain medication refills during office hours. ° °Office Endoscopy: During follow-up visits your doctor will remove any packing or splints that may have been placed and evaluate and clean your sinuses endoscopically.  Topical anesthetic will be used to make this as comfortable as possible, though you may want to take your pain medication prior to the visit.  How often this will need to be done varies from patient to patient.  After complete recovery from the surgery, you may need follow-up endoscopy from time to time, particularly if there is concern of recurrent infection or nasal polyps. ° ° °General Anesthesia, Adult, Care After °These instructions provide you with information about caring for yourself after your procedure. Your health care provider may also give you more specific instructions. Your treatment has been planned according to current medical practices, but problems sometimes occur. Call your health care provider if you have any problems or questions after your procedure. °What can I expect after the procedure? °After the procedure, it is common to have: °· Vomiting. °· A sore throat. °· Mental slowness. °It is common to feel: °· Nauseous. °· Cold or shivery. °· Sleepy. °· Tired. °· Sore or achy, even in parts of your body where you did not have surgery. °Follow these instructions at home: °For at least 24 hours after the procedure: °· Do not: °¨ Participate in activities where you could fall or become injured. °¨ Drive. °¨ Use heavy machinery. °¨ Drink alcohol. °¨ Take sleeping pills or medicines that cause drowsiness. °¨ Make important decisions or sign legal documents. °¨ Take care of children on your own. °· Rest. °Eating and drinking °· If you vomit, drink water, juice, or soup when you can drink without vomiting. °· Drink enough fluid to keep your  urine clear or pale yellow. °· Make sure you have little or no nausea before eating solid foods. °· Follow the diet recommended by your health care provider. °General instructions °· Have a responsible adult stay with you until you are awake and alert. °· Return to your normal activities as told by your health care provider. Ask your health care provider what activities are safe for you. °· Take over-the-counter and prescription medicines only as told by your health care provider. °· If you smoke, do not smoke without supervision. °· Keep all follow-up visits as told by your health care provider. This is important. °Contact a health care provider if: °· You continue to have nausea   or vomiting at home, and medicines are not helpful. °· You cannot drink fluids or start eating again. °· You cannot urinate after 8-12 hours. °· You develop a skin rash. °· You have fever. °· You have increasing redness at the site of your procedure. °Get help right away if: °· You have difficulty breathing. °· You have chest pain. °· You have unexpected bleeding. °· You feel that you are having a life-threatening or urgent problem. °This information is not intended to replace advice given to you by your health care provider. Make sure you discuss any questions you have with your health care provider. °Document Released: 10/13/2000 Document Revised: 12/10/2015 Document Reviewed: 06/21/2015 °Elsevier Interactive Patient Education © 2017 [employer] ° °

## 2016-06-05 ENCOUNTER — Ambulatory Visit: Payer: 59 | Admitting: Anesthesiology

## 2016-06-05 ENCOUNTER — Encounter
Admission: RE | Disposition: A | Discharge status: Home / Self Care | Payer: Self-pay | Source: Ambulatory Visit | Attending: Otolaryngology

## 2016-06-05 ENCOUNTER — Ambulatory Visit
Admission: RE | Admit: 2016-06-05 | Discharge: 2016-06-05 | Disposition: A | Discharge status: Home / Self Care | Payer: 59 | Source: Ambulatory Visit | Attending: Otolaryngology | Admitting: Otolaryngology

## 2016-06-05 DIAGNOSIS — E039 Hypothyroidism, unspecified: Secondary | ICD-10-CM | POA: Insufficient documentation

## 2016-06-05 DIAGNOSIS — J320 Chronic maxillary sinusitis: Secondary | ICD-10-CM | POA: Diagnosis not present

## 2016-06-05 DIAGNOSIS — Z7984 Long term (current) use of oral hypoglycemic drugs: Secondary | ICD-10-CM | POA: Diagnosis not present

## 2016-06-05 DIAGNOSIS — E119 Type 2 diabetes mellitus without complications: Secondary | ICD-10-CM | POA: Insufficient documentation

## 2016-06-05 DIAGNOSIS — J3489 Other specified disorders of nose and nasal sinuses: Secondary | ICD-10-CM | POA: Diagnosis not present

## 2016-06-05 DIAGNOSIS — Z79899 Other long term (current) drug therapy: Secondary | ICD-10-CM | POA: Diagnosis not present

## 2016-06-05 DIAGNOSIS — J328 Other chronic sinusitis: Secondary | ICD-10-CM | POA: Diagnosis not present

## 2016-06-05 DIAGNOSIS — J343 Hypertrophy of nasal turbinates: Secondary | ICD-10-CM | POA: Insufficient documentation

## 2016-06-05 DIAGNOSIS — J329 Chronic sinusitis, unspecified: Secondary | ICD-10-CM | POA: Diagnosis not present

## 2016-06-05 HISTORY — DX: Hypothyroidism, unspecified: E03.9

## 2016-06-05 HISTORY — PX: MAXILLARY ANTROSTOMY: SHX2003

## 2016-06-05 HISTORY — PX: HARDWARE REMOVAL: SHX979

## 2016-06-05 HISTORY — PX: SINUSOTOMY: SHX291

## 2016-06-05 HISTORY — PX: TURBINATE REDUCTION: SHX6157

## 2016-06-05 HISTORY — PX: IMAGE GUIDED SINUS SURGERY: SHX6570

## 2016-06-05 LAB — GLUCOSE, CAPILLARY
GLUCOSE-CAPILLARY: 70 mg/dL (ref 65–99)
GLUCOSE-CAPILLARY: 101 mg/dL — AB (ref 65–99)

## 2016-06-05 SURGERY — SINUS SURGERY, WITH IMAGING GUIDANCE
Anesthesia: General | Site: Nose | Laterality: Right | Wound class: Clean Contaminated

## 2016-06-05 MED ORDER — OXYCODONE HCL 5 MG PO TABS: 5.0000 mg | ORAL_TABLET | Freq: Once | ORAL | Status: AC | PRN

## 2016-06-05 MED ORDER — DEXAMETHASONE SODIUM PHOSPHATE 4 MG/ML IJ SOLN
INTRAMUSCULAR | Status: DC | PRN
  Administered 2016-06-05: 10 mg via INTRAVENOUS

## 2016-06-05 MED ORDER — ONDANSETRON HCL 4 MG/2ML IJ SOLN
INTRAMUSCULAR | Status: DC | PRN
  Administered 2016-06-05: 4 mg via INTRAVENOUS

## 2016-06-05 MED ORDER — ONDANSETRON HCL 4 MG/2ML IJ SOLN
4.0000 mg | Freq: Once | INTRAMUSCULAR | Status: AC | PRN
  Administered 2016-06-05: 4 mg via INTRAVENOUS

## 2016-06-05 MED ORDER — LIDOCAINE HCL (CARDIAC) 20 MG/ML IV SOLN
INTRAVENOUS | Status: DC | PRN
  Administered 2016-06-05: 50 mg via INTRAVENOUS

## 2016-06-05 MED ORDER — FENTANYL CITRATE (PF) 100 MCG/2ML IJ SOLN
25.0000 ug | INTRAMUSCULAR | Status: DC | PRN
  Administered 2016-06-05 (×3): 25 ug via INTRAVENOUS

## 2016-06-05 MED ORDER — PROPOFOL 10 MG/ML IV BOLUS
INTRAVENOUS | Status: DC | PRN
  Administered 2016-06-05: 200 mg via INTRAVENOUS

## 2016-06-05 MED ORDER — MIDAZOLAM HCL 5 MG/5ML IJ SOLN
INTRAMUSCULAR | Status: DC | PRN
  Administered 2016-06-05: 2 mg via INTRAVENOUS

## 2016-06-05 MED ORDER — LIDOCAINE-EPINEPHRINE (PF) 1 %-1:200000 IJ SOLN
INTRAMUSCULAR | Status: DC | PRN
  Administered 2016-06-05: 3 mL

## 2016-06-05 MED ORDER — ROCURONIUM BROMIDE 100 MG/10ML IV SOLN
INTRAVENOUS | Status: DC | PRN
  Administered 2016-06-05: 20 mg via INTRAVENOUS
  Administered 2016-06-05: 30 mg via INTRAVENOUS

## 2016-06-05 MED ORDER — OXYMETAZOLINE HCL 0.05 % NA SOLN
2.0000 | Freq: Once | NASAL | Status: AC
  Administered 2016-06-05: 2 via NASAL

## 2016-06-05 MED ORDER — DEXTROSE 5 % IV SOLN
2000.0000 mg | Freq: Once | INTRAVENOUS | Status: AC
  Administered 2016-06-05: 2000 mg via INTRAVENOUS

## 2016-06-05 MED ORDER — LACTATED RINGERS IV SOLN
INTRAVENOUS | Status: DC
  Administered 2016-06-05: 13:00:00 via INTRAVENOUS

## 2016-06-05 MED ORDER — SCOPOLAMINE 1 MG/3DAYS TD PT72
1.0000 | MEDICATED_PATCH | Freq: Once | TRANSDERMAL | Status: DC
  Administered 2016-06-05: 1.5 mg via TRANSDERMAL

## 2016-06-05 MED ORDER — SUCCINYLCHOLINE CHLORIDE 20 MG/ML IJ SOLN
INTRAMUSCULAR | Status: DC | PRN
  Administered 2016-06-05: 100 mg via INTRAVENOUS

## 2016-06-05 MED ORDER — FENTANYL CITRATE (PF) 100 MCG/2ML IJ SOLN
INTRAMUSCULAR | Status: DC | PRN
  Administered 2016-06-05: 100 ug via INTRAVENOUS

## 2016-06-05 MED ORDER — ACETAMINOPHEN 10 MG/ML IV SOLN
1000.0000 mg | Freq: Once | INTRAVENOUS | Status: AC
  Administered 2016-06-05: 1000 mg via INTRAVENOUS

## 2016-06-05 MED ORDER — OXYCODONE HCL 5 MG/5ML PO SOLN
5.0000 mg | Freq: Once | ORAL | Status: AC | PRN
  Administered 2016-06-05: 5 mg via ORAL

## 2016-06-05 MED FILL — CEPHALEXIN 500 MG CAPSULE: 500 | 7 days supply | Qty: 14 | Fill #0

## 2016-06-05 MED FILL — predniSONE 10 MG TABS: 10 | 6 days supply | Qty: 12 | Fill #0

## 2016-06-05 MED FILL — HYDROCODON-APAP 5-325: 5-325 | 3 days supply | Qty: 30 | Fill #0

## 2016-06-05 SURGICAL SUPPLY — 48 items
BATTERY INSTRU NAVIGATION (MISCELLANEOUS) ×24 IMPLANT
BTRY SRG DRVR LF (MISCELLANEOUS) ×20
CANISTER SUCT 1200ML W/VALVE (MISCELLANEOUS) ×6 IMPLANT
CATH IV 18X1 1/4 SAFELET (CATHETERS) ×6 IMPLANT
CNTNR SPEC 2.5X3XGRAD LEK (MISCELLANEOUS)
COAG SUCT 10F 3.5MM HAND CTRL (MISCELLANEOUS) ×5 IMPLANT
COAGULATOR SUCT 8FR VV (MISCELLANEOUS) ×6 IMPLANT
CONT SPEC 4OZ STER OR WHT (MISCELLANEOUS)
CONT SPEC 4OZ STRL OR WHT (MISCELLANEOUS)
CONTAINER SPEC 2.5X3XGRAD LEK (MISCELLANEOUS) ×5 IMPLANT
DEPRESSOR TONGUE BLADE STERILE (MISCELLANEOUS) ×5 IMPLANT
DRAIN PENROSE 1/4X12 LTX (DRAIN) ×5 IMPLANT
DRAPE HEAD BAR (DRAPES) ×6 IMPLANT
DRAPE MAG INST 16X20 L/F (DRAPES) ×5 IMPLANT
GLOVE PI ULTRA LF STRL 7.5 (GLOVE) ×10 IMPLANT
GLOVE PI ULTRA NON LATEX 7.5 (GLOVE) ×2
GOWN STRL REUS W/ TWL LRG LVL3 (GOWN DISPOSABLE) ×10 IMPLANT
GOWN STRL REUS W/TWL LRG LVL3 (GOWN DISPOSABLE)
IV CATH 18X1 1/4 SAFELET (CATHETERS) ×5
IV NS 500ML (IV SOLUTION) ×6
IV NS 500ML BAXH (IV SOLUTION) ×5 IMPLANT
KIT ROOM TURNOVER OR (KITS) ×6 IMPLANT
NAVIGATION MASK REG  ST (MISCELLANEOUS) ×6 IMPLANT
NDL ANESTHESIA 27G X 3.5 (NEEDLE) ×5 IMPLANT
NDL SPNL 25GX3.5 QUINCKE BL (NEEDLE) ×5 IMPLANT
NEEDLE ANESTHESIA  27G X 3.5 (NEEDLE) ×1
NEEDLE ANESTHESIA 27G X 3.5 (NEEDLE) ×5 IMPLANT
NEEDLE SPNL 25GX3.5 QUINCKE BL (NEEDLE) ×6 IMPLANT
NS IRRIG 500ML POUR BTL (IV SOLUTION) ×6 IMPLANT
PACK DRAPE NASAL/ENT (PACKS) ×6 IMPLANT
PACKING NASAL EPIS 4X2.4 XEROG (MISCELLANEOUS) ×13 IMPLANT
PAD GROUND ADULT SPLIT (MISCELLANEOUS) ×6 IMPLANT
PATTIES SURGICAL .5 X3 (DISPOSABLE) ×6 IMPLANT
SHAVER DIEGO BLD STD TYPE A (BLADE) IMPLANT
SOL ANTI-FOG 6CC FOG-OUT (MISCELLANEOUS) ×5 IMPLANT
SOL FOG-OUT ANTI-FOG 6CC (MISCELLANEOUS) ×1
STRAP BODY AND KNEE 60X3 (MISCELLANEOUS) ×6 IMPLANT
SUT CHROMIC 3 0 SH 27 (SUTURE) ×7 IMPLANT
SUT ETHILON 4-0 (SUTURE)
SUT ETHILON 4-0 FS2 18XMFL BLK (SUTURE)
SUT PLAIN GUT 4-0 (SUTURE) ×5 IMPLANT
SUT VICRYL+ 4-0 18IN PS-4 (SUTURE) ×5 IMPLANT
SUTURE ETHLN 4-0 FS2 18XMF BLK (SUTURE) ×5 IMPLANT
SYR 3ML LL SCALE MARK (SYRINGE) ×6 IMPLANT
SYRINGE 10CC LL (SYRINGE) ×5 IMPLANT
TOWEL OR 17X26 4PK STRL BLUE (TOWEL DISPOSABLE) ×6 IMPLANT
TUBING DECLOG (TUBING) IMPLANT
WATER STERILE IRR 250ML POUR (IV SOLUTION) ×6 IMPLANT

## 2016-06-05 NOTE — Transfer of Care (Signed)
Immediate Anesthesia Transfer of Care Note  Patient: Tammy Mooney  Procedure(s) Performed: Procedure(s) with comments: IMAGE GUIDED SINUS SURGERY (N/A) - Gave disk to cece on 11-15 kp PARTIAL TURBINATE REDUCTION (Bilateral) SINUSOTOMY VIA CALDWELL LUC (Left) - Diabetic -- oral meds MAXILLARY ANTROSTOMY (Right) HARDWARE REMOVAL (Left) - HARDWARE REMOVED FROM LEFT MAXILLA  Patient Location: PACU  Anesthesia Type: General ETT  Level of Consciousness: awake, alert  and patient cooperative  Airway and Oxygen Therapy: Patient Spontanous Breathing and Patient connected to supplemental oxygen  Post-op Assessment: Post-op Vital signs reviewed, Patient's Cardiovascular Status Stable, Respiratory Function Stable, Patent Airway and No signs of Nausea or vomiting  Post-op Vital Signs: Reviewed and stable  Complications: No apparent anesthesia complications

## 2016-06-05 NOTE — Anesthesia Procedure Notes (Signed)
Procedure Name: Intubation Date/Time: 06/05/2016 2:26 PM Performed by: Londell Moh Pre-anesthesia Checklist: Patient identified, Emergency Drugs available, Suction available, Patient being monitored and Timeout performed Patient Re-evaluated:Patient Re-evaluated prior to inductionOxygen Delivery Method: Circle system utilized Preoxygenation: Pre-oxygenation with 100% oxygen Intubation Type: IV induction Ventilation: Mask ventilation without difficulty Laryngoscope Size: Mac and 3 Grade View: Grade I Tube type: Oral Rae Tube size: 7.0 mm Number of attempts: 1 Placement Confirmation: ETT inserted through vocal cords under direct vision,  positive ETCO2 and breath sounds checked- equal and bilateral Tube secured with: Tape Dental Injury: Teeth and Oropharynx as per pre-operative assessment

## 2016-06-05 NOTE — Anesthesia Preprocedure Evaluation (Signed)
Anesthesia Evaluation  Patient identified by MRN, date of birth, ID band Patient awake    Reviewed: Allergy & Precautions, H&P , NPO status , Patient's Chart, lab work & pertinent test results  Airway Mallampati: III  TM Distance: >3 FB Neck ROM: full    Dental no notable dental hx.    Pulmonary    Pulmonary exam normal        Cardiovascular Normal cardiovascular exam     Neuro/Psych    GI/Hepatic   Endo/Other  diabetes, Well Controlled, GestationalHypothyroidism   Renal/GU      Musculoskeletal   Abdominal   Peds  Hematology   Anesthesia Other Findings   Reproductive/Obstetrics                             Anesthesia Physical Anesthesia Plan  ASA: II  Anesthesia Plan: General ETT   Post-op Pain Management:    Induction:   Airway Management Planned:   Additional Equipment:   Intra-op Plan:   Post-operative Plan:   Informed Consent: I have reviewed the patients History and Physical, chart, labs and discussed the procedure including the risks, benefits and alternatives for the proposed anesthesia with the patient or authorized representative who has indicated his/her understanding and acceptance.     Plan Discussed with:   Anesthesia Plan Comments:         Anesthesia Quick Evaluation

## 2016-06-05 NOTE — Op Note (Signed)
06/05/2016  4:10 PM    Tammy Mooney  AH:[id number]   Pre-Op Dx:  Chronic left maxillary sinusitis, nasal obstruction, bilateral inferior turbinate hypertrophy  Post-op Dx: Chronic left maxillary sinusitis, chronic right maxillary sinusitis, nasal obstruction, bilateral inferior turbinate hypertrophy  Proc: Left Caldwell-Luc with removal of contents left maxillary sinus, right endoscopic maxillary antrostomy revision, bilateral partial reduction of inferior turbinates, use of image guided system  Surg:  Tammy Mooney  Anes:  GOT  EBL:  100 mL  Comp:  None  Findings:  The natural ostium was not opened on either maxillary antrum and the antrums had to be revised for removal of scar tissue here. The left maxillary antrum had scar tissue across entire lower 40% of the sinus and there was thick yellow mucus that was hidden in the scar tissue here. Was from previous maxillary osteotomy for maxillary advancement surgery. Several screws plate were removed from the left cheek area. Very large inferior turbinates and partially lateralized middle turbinates  Procedure: The patient was brought to the operative room placed supine position. She is given general anesthesia by oral endotracheal intubation. Prepped using 3 mL 1% Xylocaine with epi 100,000 for infiltration of the inferior turbinates bilaterally. Another 1 mL was placed underneath the lip in the gingival buccal sulcus area. Cottonoid pledgets were placed in nose soaked with Afrin and lidocaine 1% with epi 1-100,000. The image guided system was brought in and the CT scan was downloaded disc. The template was applied to the face and and was registered to the system. There is 0.7 mm of variance. Suction instruments were then registered and there was good alignment with the system. The patient was prepped and draped sterile fashion.  The 0 scope was used to visualize the left nasal airway. The inferior turbinate was somewhat large even with  that shrunk down. He middle turbinate was somewhat lateralized this was fractured medially to provide better exposure into the left maxillary antrum. There was scar tissue along its anterior area where the natural ostium was this was partially blocked. He see thick mucus collecting up behind it but could not drain out. A side biting was to this anterior tissue and open up the natural ostium. The microdebrider was used for trimming some of the rough edges to make sure that this area was opened. His and the 30 and 70 scope she can see a ledge of bone that was halfway down the maxillary sinus and scar tissue beneath it. I cannot see purulence at this time. Adenoid pledget was placed or at the opening for vasoconstriction.  The 0 scope used to visualize the left nasal airway again the middle turbinate had lateralized some and so it was medialized. The maxillary antrum had scar tissue along its anterior border again causing recycling and partial blockage of the sinus. The side biting forceps were used for incising this and making sure it was wide open. 30 and 70 scopes were used to visualize the maxillary sinus and appeared to be clear and side. Cottonoid pledget was placed or as well for vasoconstriction. R Graff the left Marcelline Deist luck was then done with elevation of mucoperiosteum over the canine fossa. Was a mini plate here with the several screws attached to it. There was a loss of bone and part of the area. I removed the mini plate by removing 3 of the screws and get in almost all of it removed. There was one screw very lateral that I could not get unscrewed but it was  not in the area of dissection. Once the plate and screws removed. There was a small hole found inferiorly into a pocket of mucosal lined sinus at its base. There is thick yellow creamy mucus that was suctioned out of this area. It was a scar band between that and the upper portion of the sinus. I was able to break into that as well and then used  Kerrisons and Takahashi forceps for fracturing some of this bony lead to open up the entire sinus. The lateral wall of the sinus was missing and some of the fat tissue wanted to lapse into the sinus. The right some of this fatty tissue until it was flat around the lateral wall. Moved to the bony ledge medially and posteriorly all the way back to the posterior wall of the maxillary sinus. Left fairly open triangular maxillary sinus. Xerogel at the posterior wall of the sinus first and then all along its lateral wall and I then put some along the medial wall as well to mostly fill up the bottom of the maxillary sinus to help prevent scarring across here. The mucosa was then allowed to fall back in the cheek and the [place] luck incision was then closed using a 3-0 chromic with a running locking suture.  The cotton pledges were removed from the nose and it was revisualized. There is some of the xerogel right the opening on the inside the maxillary sinus. More xerogel was placed the middle meatus to help prevent lateralization of the middle turbinate again. The inferior turbinate was trimmed along its inferior border and electrocautery was used along its inferior medial border the help control some the swelling. Main or of the turbinate was outfractured. This left a much bigger airway on the left. The right side was visualized with the 0 scope and the middle meatus had a piece of xerogel placed into the opening to help prevent scarring here. Another piece of xerogel was placed in the middle meatus to help keep the middle turbinate medialized. The inferior turbinate was trimmed again similar to the opposite side and then outfractured again. This left a bigger airway on the right side.  The patient tolerated the procedure well. She was awakened and taken to the recovery room in satisfactory condition. There were no operative complications.  Dispo:   To PACU to be discharged home  Plan:  To follow-up in the  office in 56 days. She'll rinse her mouth after eating and have just soft foods and liquids right now. She'll use for pain and will have her on a small prednisone taper along with antibiotics for prevention.  Oather Muilenburg Mooney  06/05/2016 4:10 PM

## 2016-06-05 NOTE — Anesthesia Postprocedure Evaluation (Signed)
Anesthesia Post Note  Patient: Tammy Mooney  Procedure(s) Performed: Procedure(s) (LRB): IMAGE GUIDED SINUS SURGERY (N/A) PARTIAL TURBINATE REDUCTION (Bilateral) SINUSOTOMY VIA CALDWELL LUC (Left) MAXILLARY ANTROSTOMY (Right) HARDWARE REMOVAL (Left)  Patient location during evaluation: PACU Anesthesia Type: General Level of consciousness: awake and alert and oriented Pain management: satisfactory to patient Vital Signs Assessment: post-procedure vital signs reviewed and stable Respiratory status: spontaneous breathing, nonlabored ventilation and respiratory function stable Cardiovascular status: blood pressure returned to baseline and stable Postop Assessment: Adequate PO intake and No signs of nausea or vomiting Anesthetic complications: no    Raliegh Ip

## 2016-06-05 NOTE — H&P (Signed)
  H&P has been reviewed and no changes necessary. To be downloaded later. 

## 2016-06-06 ENCOUNTER — Encounter: Payer: Self-pay | Admitting: Otolaryngology

## 2016-06-09 LAB — SURGICAL PATHOLOGY

## 2016-06-11 DIAGNOSIS — Z48813 Encounter for surgical aftercare following surgery on the respiratory system: Secondary | ICD-10-CM | POA: Diagnosis not present

## 2016-06-20 DIAGNOSIS — Z48813 Encounter for surgical aftercare following surgery on the respiratory system: Secondary | ICD-10-CM | POA: Diagnosis not present

## 2016-06-25 MED FILL — LEVOTHYROXINE 88 MCG TABLET: 88 | 90 days supply | Qty: 90 | Fill #0

## 2016-07-01 MED FILL — METFORMIN HCL ER 750 MG TAB: 750 | 30 days supply | Qty: 60 | Fill #0

## 2016-07-04 DIAGNOSIS — Z48813 Encounter for surgical aftercare following surgery on the respiratory system: Secondary | ICD-10-CM | POA: Diagnosis not present

## 2016-07-09 ENCOUNTER — Ambulatory Visit (INDEPENDENT_AMBULATORY_CARE_PROVIDER_SITE_OTHER): Payer: 59 | Admitting: Family Medicine

## 2016-07-09 ENCOUNTER — Encounter: Payer: Self-pay | Admitting: Family Medicine

## 2016-07-09 VITALS — BP 120/78 | HR 97 | Resp 12 | Ht 66.0 in | Wt 256.2 lb

## 2016-07-09 DIAGNOSIS — E119 Type 2 diabetes mellitus without complications: Secondary | ICD-10-CM

## 2016-07-09 DIAGNOSIS — R197 Diarrhea, unspecified: Secondary | ICD-10-CM

## 2016-07-09 DIAGNOSIS — R740 Nonspecific elevation of levels of transaminase and lactic acid dehydrogenase [LDH]: Secondary | ICD-10-CM

## 2016-07-09 DIAGNOSIS — E669 Obesity, unspecified: Secondary | ICD-10-CM | POA: Diagnosis not present

## 2016-07-09 DIAGNOSIS — E039 Hypothyroidism, unspecified: Secondary | ICD-10-CM

## 2016-07-09 DIAGNOSIS — R7401 Elevation of levels of liver transaminase levels: Secondary | ICD-10-CM

## 2016-07-09 LAB — COMPREHENSIVE METABOLIC PANEL
ALBUMIN: 4.4 g/dL (ref 3.5–5.2)
ALT: 62 U/L — AB (ref 0–35)
AST: 29 U/L (ref 0–37)
Alkaline Phosphatase: 68 U/L (ref 39–117)
BILIRUBIN TOTAL: 0.5 mg/dL (ref 0.2–1.2)
BUN: 12 mg/dL (ref 6–23)
CALCIUM: 9.6 mg/dL (ref 8.4–10.5)
CHLORIDE: 105 meq/L (ref 96–112)
CO2: 23 mEq/L (ref 19–32)
CREATININE: 0.66 mg/dL (ref 0.40–1.20)
GFR: 107.27 mL/min (ref >60.00)
Glucose, Bld: 104 mg/dL — ABNORMAL HIGH (ref 70–99)
Potassium: 3.8 mEq/L (ref 3.5–5.1)
SODIUM: 138 meq/L (ref 135–145)
Total Protein: 7.1 g/dL (ref 6.0–8.3)

## 2016-07-09 LAB — TSH: TSH: 4.08 u[IU]/mL (ref 0.35–4.50)

## 2016-07-09 LAB — HEMOGLOBIN A1C: Hgb A1c MFr Bld: 6.1 % (ref 4.6–6.5)

## 2016-07-09 NOTE — Progress Notes (Signed)
Pre visit review using our clinic review tool, if applicable. No additional management support is needed unless otherwise documented below in the visit note. 

## 2016-07-09 NOTE — Progress Notes (Signed)
HPI:   [name] is a 35 y.o. female, who is here today to follow on some of her chronic medical problems. She is not exercising regularly, she still trying to follow a healthy diet. She is planning on doing weight management through work, starting January 2018.  Since her last OV, 01/03/2016, she has had sinus surgery, recovering well.    Diabetes Mellitus II:   Currently on Metformin XR 750 mg 2 tabs daily, which she also takes for PCOS. Problem has been well controlled. Checking BS's : No Hypoglycemia:Denies.  She is tolerating medications well. Currently she is having mild loose stools (1-2 daily), attributed to antibiotic treatment she completed recently for chronic sinus infection. She denies abdominal pain, nausea, vomiting, polydipsia, polyuria, or polyphagia. No numbness, tingling, or burning.     Lab Results  Component Value Date   CREATININE 0.71 01/03/2016   BUN 13 01/03/2016   NA 138 01/03/2016   K 3.8 01/03/2016   CL 104 01/03/2016   CO2 26 01/03/2016    Lab Results  Component Value Date   HGBA1C 5.8 01/03/2016   Lab Results  Component Value Date   MICROALBUR <0.7 01/03/2016     Concerns today: She also would like TSH done. History of hypothyroidism, she is currently on Synthroid 88 g daily.  Lab Results  Component Value Date   TSH 2.87 01/03/2016   She denies abnormal weight loss/gain, palpitations, cold/heat intolerance, or fatigue.  She also has history of anxiety, she is on BuSpar 5 mg, which she takes as needed, about twice per month.  She denies depressed mood or suicidal thoughts.  -Last office visit ALT was mildly elevated at 47, she denies history of blood transfusions or other risk factors for hepatitis B or C. She is positive she completed her hepatitis B vaccination. She is reporting a needle stick accident, she had workup done and according to patient included HIV, hepatitis B and hepatitis C  antibodies.   Lab Results  Component Value Date   ALT 47 (H) 01/03/2016   AST 22 01/03/2016   ALKPHOS 67 01/03/2016   BILITOT 0.4 01/03/2016     Review of Systems  Constitutional: Negative for activity change, appetite change, fatigue, fever and unexpected weight change.  HENT: Negative for mouth sores, nosebleeds and trouble swallowing.   Eyes: Negative for redness and visual disturbance.  Respiratory: Negative for cough, shortness of breath and wheezing.   Cardiovascular: Negative for chest pain, palpitations and leg swelling.  Gastrointestinal: Positive for diarrhea. Negative for abdominal pain, blood in stool, nausea and vomiting.  Endocrine: Negative for cold intolerance, heat intolerance, polydipsia, polyphagia and polyuria.  Genitourinary: Negative for decreased urine volume, difficulty urinating and hematuria.  Musculoskeletal: Negative for back pain and myalgias.  Skin: Negative for rash.  Neurological: Negative for syncope, weakness, numbness and headaches.  Psychiatric/Behavioral: Negative for confusion, sleep disturbance and suicidal ideas. The patient is nervous/anxious.       Current Outpatient Prescriptions on File Prior to Visit  Medication Sig Dispense Refill  . busPIRone (BUSPAR) 5 MG tablet Take 1 tablet (5 mg total) by mouth 2 (two) times daily as needed. 60 tablet 1  . levothyroxine (SYNTHROID, LEVOTHROID) 88 MCG tablet Take 1 tablet (88 mcg total) by mouth daily before breakfast. 90 tablet 1  . metFORMIN (GLUCOPHAGE XR) 750 MG 24 hr tablet Take 2 tablets (1,500 mg total) by mouth daily with breakfast. 120 tablet 0  . MIRVASO 0.33 %  GEL   2   No current facility-administered medications on file prior to visit.      Past Medical History:  Diagnosis Date  . Diabetes mellitus without complication ([name])    glyburide  . H/O varicella   . Hypothyroidism   . Infections of genitourinary tract in pregnancy, unspecified as to episode of care(646.60)   . PCOS  (polycystic ovarian syndrome)    Allergies  Allergen Reactions  . Adhesive [Tape] Rash and Other (See Comments)    Plastic tape causes rash, paper tape is okay, tegaderm is ok  . Penicillins Rash    Social History   Social History  . Marital status: Married    Spouse name: N/A  . Number of children: N/A  . Years of education: N/A   Social History Main Topics  . Smoking status: Never Smoker  . Smokeless tobacco: Never Used  . Alcohol use No  . Drug use: No  . Sexual activity: Yes   Other Topics Concern  . None   Social History Narrative  . None    Vitals:   07/09/16 1057  BP: 120/78  Pulse: 97  Resp: 12   Body mass index is 41.36 kg/m.  Wt Readings from Last 3 Encounters:  07/09/16 256 lb 4 oz (116.2 kg)  06/05/16 260 lb (117.9 kg)  01/03/16 261 lb (118.4 kg)     Physical Exam  Nursing note and vitals reviewed. Constitutional: She is oriented to person, place, and time. She appears well-developed. No distress.  HENT:  Head: Atraumatic.  Mouth/Throat: Oropharynx is clear and moist and mucous membranes are normal.  Eyes: Conjunctivae and EOM are normal. Pupils are equal, round, and reactive to light.  Neck: Thyromegaly (Mild) present. No thyroid mass present.  Cardiovascular: Normal rate and regular rhythm.   No murmur heard. Pulses:      Dorsalis pedis pulses are 2+ on the right side, and 2+ on the left side.  Respiratory: Effort normal and breath sounds normal. No respiratory distress.  GI: Soft. She exhibits no mass. There is no hepatomegaly. There is no tenderness.  Musculoskeletal: She exhibits no edema or tenderness.  Lymphadenopathy:    She has no cervical adenopathy.  Neurological: She is alert and oriented to person, place, and time. She has normal strength. Coordination and gait normal.  Skin: Skin is warm. No erythema.  Psychiatric: She has a normal mood and affect.  Well groomed, good eye contact.    Diabetic foot exam:  Monofilament  normal bilateral. Peripheral pulses present (DP). No calluses No hypertrophic/long toenails.   ASSESSMENT AND PLAN:     Tammy Mooney was seen today for follow-up.  Diagnoses and all orders for this visit:    Controlled type 2 diabetes mellitus without complication, without long-term current use of insulin ([place])  HgA1C has been at goal, today lab pending. No changes in current management (mainly because Hx of PCOS). Regular exercise and healthy diet with avoidance of added sugar food intake is an important part of treatment and recommended. Annual eye exam, periodic dental and foot care recommended. F/U in 12 months will be appropriate if HgA1C still < 6.0.  -     Hemoglobin A1c -     Comprehensive metabolic panel  Obesity (BMI 35.0-39.9 without comorbidity)  We discussed benefits of wt loss as well as adverse effects of obesity. Consistency with healthy diet and physical activity recommended.   Elevated transaminase level  Mild and asymptomatic. We discussed possible etiologies, including fatty  liver. Since she is reporting recent workup after accidental needlestick I am not recommending further work up today except for LFT's. She is going to e mail me results. Wt loss strongly recommended.  -     Comprehensive metabolic panel  Primary hypothyroidism  No changes in current management, will follow labs done today and will give further recommendations accordingly. F/U in 12 months.  -     TSH  Diarrhea, unspecified type  We discussed possible causes, he could be related to recent antibiotic treatment. *A daily probiotic. If not resolved in 2-3 weeks she was instructed to let me know, further workup would be necessary if persistent (C. Diff)      -Ms. FRANCE POWE was advised to return sooner than planned today if new concerns arise.       Rossy Virag G. [place], MD  [facility]. [place] office.

## 2016-07-09 NOTE — Patient Instructions (Signed)
A few things to remember from today's visit:   Controlled type 2 diabetes mellitus without complication, without long-term current use of insulin ([place]) - Plan: Hemoglobin A1c, Comprehensive metabolic panel  Obesity (BMI 35.0-39.9 without comorbidity)  Elevated transaminase level - Plan: Comprehensive metabolic panel  Primary hypothyroidism - Plan: TSH    [name], today we have followed on some of your chronic medical problems and they seem to be stable, so no changes in current management today.  Review medication list and be sure if is accurate.  -Remember a healthy diet and regular physical activity are very important for prevention as well as for well being; they also help with many chronic problems, decreasing the need of adding new medications and delaying or preventing possible complications.  Remember to arrange your follow up appt before leaving today. Please follow sooner than planned if a new concern arises.   Please be sure medication list is accurate. If a new problem present, please set up appointment sooner than planned today.

## 2016-08-11 ENCOUNTER — Encounter: Payer: Self-pay | Admitting: Family Medicine

## 2016-08-11 ENCOUNTER — Other Ambulatory Visit: Payer: Self-pay

## 2016-08-11 DIAGNOSIS — E039 Hypothyroidism, unspecified: Secondary | ICD-10-CM

## 2016-08-11 DIAGNOSIS — E119 Type 2 diabetes mellitus without complications: Secondary | ICD-10-CM

## 2016-08-11 MED ORDER — METFORMIN HCL ER 750 MG PO TB24: 1500.0000 mg | ORAL_TABLET | Freq: Every day | ORAL | 2 refills | Status: DC

## 2016-08-11 MED ORDER — LEVOTHYROXINE SODIUM 88 MCG PO TABS: 88.0000 ug | ORAL_TABLET | Freq: Every day | ORAL | 1 refills | Status: DC

## 2016-08-11 MED FILL — METFORMIN HCL ER 750 MG TAB: 750 | 30 days supply | Qty: 120 | Fill #0

## 2016-09-29 MED FILL — METFORMIN HCL ER 750 MG TAB: 750 | 60 days supply | Qty: 120 | Fill #1

## 2016-09-29 MED FILL — LEVOTHYROXINE 88 MCG TABLET: 88 | 90 days supply | Qty: 90 | Fill #1

## 2016-10-03 ENCOUNTER — Encounter: Payer: Self-pay | Admitting: Family Medicine

## 2016-10-03 ENCOUNTER — Other Ambulatory Visit: Payer: Self-pay | Admitting: Family Medicine

## 2016-10-03 MED ORDER — TERCONAZOLE 0.4 % VA CREA: 1.0000 | TOPICAL_CREAM | Freq: Every day | VAGINAL | 1 refills | Status: AC

## 2016-12-18 DIAGNOSIS — E119 Type 2 diabetes mellitus without complications: Secondary | ICD-10-CM | POA: Diagnosis not present

## 2016-12-18 DIAGNOSIS — Z0100 Encounter for examination of eyes and vision without abnormal findings: Secondary | ICD-10-CM | POA: Diagnosis not present

## 2016-12-22 MED FILL — LEVOTHYROXINE 88 MCG TABLET: 88 | 90 days supply | Qty: 90 | Fill #0

## 2016-12-22 MED FILL — METFORMIN HCL ER 750 MG TAB: 750 | 60 days supply | Qty: 120 | Fill #2

## 2017-01-29 ENCOUNTER — Encounter (INDEPENDENT_AMBULATORY_CARE_PROVIDER_SITE_OTHER): Payer: Self-pay | Admitting: Family Medicine

## 2017-01-30 ENCOUNTER — Encounter (INDEPENDENT_AMBULATORY_CARE_PROVIDER_SITE_OTHER): Payer: Self-pay | Admitting: Family Medicine

## 2017-02-02 NOTE — Telephone Encounter (Signed)
Please advise 

## 2017-02-09 ENCOUNTER — Ambulatory Visit (INDEPENDENT_AMBULATORY_CARE_PROVIDER_SITE_OTHER): Payer: 59 | Admitting: Family Medicine

## 2017-02-09 ENCOUNTER — Encounter (INDEPENDENT_AMBULATORY_CARE_PROVIDER_SITE_OTHER): Payer: Self-pay | Admitting: Family Medicine

## 2017-02-09 VITALS — BP 108/73 | HR 76 | Temp 97.8°F | Resp 14 | Ht 66.0 in | Wt 260.0 lb

## 2017-02-09 DIAGNOSIS — IMO0001 Reserved for inherently not codable concepts without codable children: Secondary | ICD-10-CM

## 2017-02-09 DIAGNOSIS — Z6841 Body Mass Index (BMI) 40.0 and over, adult: Secondary | ICD-10-CM | POA: Diagnosis not present

## 2017-02-09 DIAGNOSIS — E119 Type 2 diabetes mellitus without complications: Secondary | ICD-10-CM | POA: Diagnosis not present

## 2017-02-09 DIAGNOSIS — E669 Obesity, unspecified: Secondary | ICD-10-CM

## 2017-02-09 DIAGNOSIS — Z0289 Encounter for other administrative examinations: Secondary | ICD-10-CM

## 2017-02-09 DIAGNOSIS — Z1389 Encounter for screening for other disorder: Secondary | ICD-10-CM | POA: Diagnosis not present

## 2017-02-09 DIAGNOSIS — R0609 Other forms of dyspnea: Secondary | ICD-10-CM | POA: Diagnosis not present

## 2017-02-09 DIAGNOSIS — R5383 Other fatigue: Secondary | ICD-10-CM

## 2017-02-09 DIAGNOSIS — Z1331 Encounter for screening for depression: Secondary | ICD-10-CM

## 2017-02-09 HISTORY — DX: Morbid (severe) obesity due to excess calories: E66.01

## 2017-02-10 LAB — LIPID PANEL WITH LDL/HDL RATIO
CHOLESTEROL TOTAL: 168 mg/dL (ref 100–199)
HDL: 34 mg/dL — AB (ref >39)
LDL CALC: 111 mg/dL — AB (ref 0–99)
LDL/HDL RATIO: 3.3 ratio — AB (ref 0.0–3.2)
TRIGLYCERIDES: 117 mg/dL (ref 0–149)
VLDL CHOLESTEROL CAL: 23 mg/dL (ref 5–40)

## 2017-02-10 LAB — TSH: TSH: 5.25 u[IU]/mL — ABNORMAL HIGH (ref 0.450–4.500)

## 2017-02-10 LAB — CBC WITH DIFFERENTIAL/PLATELET
BASOS ABS: 0 10*3/uL (ref 0.0–0.2)
BASOS: 1 %
EOS (ABSOLUTE): 0.2 10*3/uL (ref 0.0–0.4)
Eos: 2 %
HEMATOCRIT: 41.6 % (ref 34.0–46.6)
HEMOGLOBIN: 14 g/dL (ref 11.1–15.9)
IMMATURE GRANS (ABS): 0 10*3/uL (ref 0.0–0.1)
Immature Granulocytes: 0 %
LYMPHS ABS: 2.4 10*3/uL (ref 0.7–3.1)
LYMPHS: 28 %
MCH: 28.5 pg (ref 26.6–33.0)
MCHC: 33.7 g/dL (ref 31.5–35.7)
MCV: 85 fL (ref 79–97)
MONOCYTES: 5 %
Monocytes Absolute: 0.4 10*3/uL (ref 0.1–0.9)
NEUTROS ABS: 5.6 10*3/uL (ref 1.4–7.0)
Neutrophils: 64 %
Platelets: 394 10*3/uL — ABNORMAL HIGH (ref 150–379)
RBC: 4.91 x10E6/uL (ref 3.77–5.28)
RDW: 14.2 % (ref 12.3–15.4)
WBC: 8.7 10*3/uL (ref 3.4–10.8)

## 2017-02-10 LAB — COMPREHENSIVE METABOLIC PANEL
ALBUMIN: 4.4 g/dL (ref 3.5–5.5)
ALT: 62 IU/L — ABNORMAL HIGH (ref 0–32)
AST: 27 IU/L (ref 0–40)
Albumin/Globulin Ratio: 1.5 (ref 1.2–2.2)
Alkaline Phosphatase: 76 IU/L (ref 39–117)
BILIRUBIN TOTAL: 0.5 mg/dL (ref 0.0–1.2)
BUN / CREAT RATIO: 21 (ref 9–23)
BUN: 13 mg/dL (ref 6–20)
CALCIUM: 9.5 mg/dL (ref 8.7–10.2)
CHLORIDE: 102 mmol/L (ref 96–106)
CO2: 21 mmol/L (ref 20–29)
Creatinine, Ser: 0.62 mg/dL (ref 0.57–1.00)
GFR, EST AFRICAN AMERICAN: 133 mL/min/{1.73_m2} (ref >59)
GFR, EST NON AFRICAN AMERICAN: 116 mL/min/{1.73_m2} (ref >59)
GLUCOSE: 95 mg/dL (ref 65–99)
Globulin, Total: 2.9 g/dL (ref 1.5–4.5)
Potassium: 4.2 mmol/L (ref 3.5–5.2)
Sodium: 140 mmol/L (ref 134–144)
TOTAL PROTEIN: 7.3 g/dL (ref 6.0–8.5)

## 2017-02-10 LAB — VITAMIN D 25 HYDROXY (VIT D DEFICIENCY, FRACTURES): VIT D 25 HYDROXY: 23.4 ng/mL — AB (ref 30.0–100.0)

## 2017-02-10 LAB — VITAMIN B12: Vitamin B-12: 303 pg/mL (ref 232–1245)

## 2017-02-10 LAB — FOLATE: Folate: 17.2 ng/mL (ref >3.0)

## 2017-02-10 LAB — T3: T3, Total: 122 ng/dL (ref 71–180)

## 2017-02-10 LAB — HEMOGLOBIN A1C
Est. average glucose Bld gHb Est-mCnc: 143 mg/dL
Hgb A1c MFr Bld: 6.6 % — ABNORMAL HIGH (ref 4.8–5.6)

## 2017-02-10 LAB — T4, FREE: FREE T4: 1.43 ng/dL (ref 0.82–1.77)

## 2017-02-10 LAB — INSULIN, RANDOM: INSULIN: 42.1 u[IU]/mL — ABNORMAL HIGH (ref 2.6–24.9)

## 2017-02-10 NOTE — Progress Notes (Signed)
Office: [phone]  /  Fax: [phone]   HPI:   Chief Complaint: OBESITY  Tammy Mooney (MR# [id number]) is a 36 y.o. female who presents on 02/10/2017 for obesity evaluation and treatment. Current BMI is Body mass index is 41.97 kg/m.Tammy Mooney has struggled with obesity for years and has been unsuccessful in either losing weight or maintaining long term weight loss. Tammy Mooney has been on phentermine in the past but she didn't do well with weight loss. Tammy Mooney attended our information session and states she is currently in the action stage of change and ready to dedicate time achieving and maintaining a healthier weight.  Tammy Mooney states her family eats meals together she thinks her family will eat healthier with  her she struggles with family and or coworkers weight loss sabotage her desired weight loss is 60 to 80 lbs she has been heavy most of  her life she started gaining weight with pregnancies her heaviest weight ever was 260 lbs. she has significant food cravings issues  she is frequently drinking liquids with calories she frequently makes poor food choices she frequently eats larger portions than normal  she has binge eating behaviors she struggles with emotional eating    Fatigue Tammy Mooney feels her energy is lower than it should be. This has worsened with weight gain and has not worsened recently. Tammy Mooney admits to daytime somnolence and  admits to waking up still tired. Patient is at risk for obstructive sleep apnea. Patent has a history of symptoms of daytime fatigue and morning fatigue. Patient generally gets 8 hours of sleep per night, and states they generally have restful sleep. Snoring is present. Apneic episodes are not present. Epworth Sleepiness Score is 7  Dyspnea on exertion Tammy Mooney notes increasing shortness of breath with exercising and seems to be worsening over time with weight gain. She notes getting out of breath sooner with activity than she used to. This  has not gotten worse recently. Tammy Mooney denies orthopnea.  Diabetes II Gillie has a diagnosis of diabetes type II. She has a history of 1 non fasting glucose >200 in 2016. Tammy Mooney states fasting BGs now run between 100 and 120 and denies any hypoglycemic episodes. Last A1c was at 7.1 She is attempting to work on intensive lifestyle modifications including diet, exercise, and weight loss to help control her blood glucose levels.  Depression Screen Tammy Mooney's Food and Mood (modified PHQ-9) score was  Depression screen PHQ 2/9 02/09/2017  Decreased Interest 2  Down, Depressed, Hopeless 2  PHQ - 2 Score 4  Altered sleeping 1  Tired, decreased energy 3  Change in appetite 2  Feeling bad or failure about yourself  3  Trouble concentrating 0  Moving slowly or fidgety/restless 0  Suicidal thoughts 0  PHQ-9 Score 13    ALLERGIES: Allergies  Allergen Reactions   Adhesive [Tape] Rash and Other (See Comments)    Plastic tape causes rash, paper tape is okay, tegaderm is ok   Penicillins Rash    MEDICATIONS: Current Outpatient Prescriptions on File Prior to Visit  Medication Sig Dispense Refill   busPIRone (BUSPAR) 5 MG tablet Take 1 tablet (5 mg total) by mouth 2 (two) times daily as needed. 60 tablet 1   levothyroxine (SYNTHROID, LEVOTHROID) 88 MCG tablet Take 1 tablet (88 mcg total) by mouth daily before breakfast. 90 tablet 1   metFORMIN (GLUCOPHAGE XR) 750 MG 24 hr tablet Take 2 tablets (1,500 mg total) by mouth daily with breakfast. 120 tablet 2   MIRVASO  0.33 % GEL   2   No current facility-administered medications on file prior to visit.     PAST MEDICAL HISTORY: Past Medical History:  Diagnosis Date   Diabetes mellitus without complication (Tammy Mooney)    glyburide   H/O varicella    Hypothyroidism    Infections of genitourinary tract in pregnancy, unspecified as to episode of care(646.60)    Infertility, female    PCOS (polycystic ovarian syndrome)     PAST  SURGICAL HISTORY: Past Surgical History:  Procedure Laterality Date   CESAREAN SECTION     CESAREAN SECTION  07/01/2012   Procedure: CESAREAN SECTION;  Surgeon: Lovenia Kim, MD;  Location: [facility];  Service: Obstetrics;  Laterality: N/A;  Repeat cesarean section with delivery of baby girl at 70. Apgars 9/9.   CESAREAN SECTION N/A 10/25/2014   Procedure: Repeat CESAREAN SECTION;  Surgeon: Brien Few, MD;  Location: [facility];  Service: Obstetrics;  Laterality: N/A;  EDD: 10/31/14   EYE SURGERY     lasic   HARDWARE REMOVAL Left 06/05/2016   Procedure: HARDWARE REMOVAL;  Surgeon: Margaretha Sheffield, MD;  Location: [facility];  Service: ENT;  Laterality: Left;  [place] N/A 06/05/2016   Procedure: IMAGE GUIDED SINUS SURGERY;  Surgeon: Margaretha Sheffield, MD;  Location: [place] [facility];  Service: ENT;  Laterality: N/A;  Gave disk to cece on 11-15 kp   MANDIBLE SURGERY     MAXILLARY ANTROSTOMY Right 06/05/2016   Procedure: MAXILLARY ANTROSTOMY;  Surgeon: Margaretha Sheffield, MD;  Location: [facility];  Service: ENT;  Laterality: Right;   NASAL SINUS SURGERY     SINUSOTOMY Left 06/05/2016   Procedure: SINUSOTOMY VIA CALDWELL LUC;  Surgeon: Margaretha Sheffield, MD;  Location: [place];  Service: ENT;  Laterality: Left;  Diabetic -- oral meds   TONSILLECTOMY     TURBINATE REDUCTION Bilateral 06/05/2016   Procedure: PARTIAL TURBINATE REDUCTION;  Surgeon: Margaretha Sheffield, MD;  Location: [facility];  Service: ENT;  Laterality: Bilateral;    SOCIAL HISTORY: Social History  Substance Use Topics   Smoking status: Never Smoker   Smokeless tobacco: Never Used   Alcohol use No    FAMILY HISTORY: Family History  Problem Relation Age of Onset   Diabetes Mother    Depression Mother    Sleep apnea Mother    Obesity Mother    Heart attack Father    Hypertension Father    Hyperlipidemia Father    Heart  disease Father    Heart disease Maternal Grandfather    Diabetes Paternal Grandmother     ROS: Review of Systems  Constitutional: Positive for malaise/fatigue.  Respiratory: Positive for shortness of breath (on exertion).   Cardiovascular: Negative for orthopnea.  Endo/Heme/Allergies:       Negative hypoglycemia  Psychiatric/Behavioral:       Stress    PHYSICAL EXAM: Blood pressure 108/73, pulse 76, temperature 97.8 F (36.6 C), temperature source Oral, resp. rate 14, weight 260 lb (117.9 kg), SpO2 98 %, unknown if currently breastfeeding. Body mass index is 41.97 kg/m. Physical Exam  Constitutional: She is oriented to person, place, and time. She appears well-developed and well-nourished.  Cardiovascular: Normal rate.   Pulmonary/Chest: Effort normal.  Musculoskeletal: Normal range of motion.  Neurological: She is oriented to person, place, and time.  Skin: Skin is warm and dry.  Psychiatric: She has a normal mood and affect. Her behavior is normal.  Vitals reviewed.   RECENT LABS AND TESTS: BMET    Component Value Date/Time   NA 140 02/09/2017 1053   K 4.2 02/09/2017 1053   CL 102 02/09/2017 1053   CO2 21 02/09/2017 1053   GLUCOSE 95 02/09/2017 1053   GLUCOSE 104 (H) 07/09/2016 1213   BUN 13 02/09/2017 1053   CREATININE 0.62 02/09/2017 1053   CALCIUM 9.5 02/09/2017 1053   GFRNONAA 116 02/09/2017 1053   GFRAA 133 02/09/2017 1053   Lab Results  Component Value Date   HGBA1C 6.6 (H) 02/09/2017   Lab Results  Component Value Date   INSULIN WILL FOLLOW 02/09/2017   CBC    Component Value Date/Time   WBC 8.7 02/09/2017 1053   WBC 10.6 (H) 10/26/2014 0545   RBC 4.91 02/09/2017 1053   RBC 4.47 10/26/2014 0545   HGB 14.0 02/09/2017 1053   HCT 41.6 02/09/2017 1053   PLT 394 (H) 02/09/2017 1053   MCV 85 02/09/2017 1053   MCH 28.5 02/09/2017 1053   MCH 26.8 10/26/2014 0545   MCHC 33.7 02/09/2017 1053   MCHC 33.7 10/26/2014 0545   RDW 14.2 02/09/2017 1053    LYMPHSABS 2.4 02/09/2017 1053   MONOABS 0.5 05/20/2013 0925   EOSABS 0.2 02/09/2017 1053   BASOSABS 0.0 02/09/2017 1053   Iron/TIBC/Ferritin/ %Sat No results found for: IRON, TIBC, FERRITIN, IRONPCTSAT Lipid Panel     Component Value Date/Time   CHOL 168 02/09/2017 1053   TRIG 117 02/09/2017 1053   HDL 34 (L) 02/09/2017 1053   CHOLHDL 5 05/20/2013 0925   VLDL 23.4 05/20/2013 0925   LDLCALC 111 (H) 02/09/2017 1053   Hepatic Function Panel     Component Value Date/Time   PROT 7.3 02/09/2017 1053   ALBUMIN 4.4 02/09/2017 1053   AST 27 02/09/2017 1053   ALT 62 (H) 02/09/2017 1053   ALKPHOS 76 02/09/2017 1053   BILITOT 0.5 02/09/2017 1053      Component Value Date/Time   TSH 5.250 (H) 02/09/2017 1053   TSH 4.08 07/09/2016 1213   TSH 2.87 01/03/2016 1425    ECG  shows NSR with a rate of 84 BPM INDIRECT CALORIMETER done today shows a VO2 of 318 and a REE of 2214. Her calculated basal metabolic rate is 9628 thus her basal metabolic rate is better than expected.    ASSESSMENT AND PLAN: Other fatigue - Plan: EKG 12-Lead, Comprehensive metabolic panel, CBC with Differential/Platelet, Lipid Panel With LDL/HDL Ratio, Vitamin B12, TSH, T4, free, T3, Folate, VITAMIN D 25 Hydroxy (Vit-D Deficiency, Fractures)  Dyspnea on exertion  Type 2 diabetes mellitus without complication, without long-term current use of insulin ([name]) - Plan: Hemoglobin A1c, Insulin, random  Depression screening  Class 3 obesity without serious comorbidity with body mass index (BMI) of 40.0 to 44.9 in adult, unspecified obesity type ([place])  PLAN:  Fatigue Tammy Mooney was informed that her fatigue may be related to obesity, depression or many other causes. Labs will be ordered, and in the meanwhile Tammy Mooney has agreed to work on diet, exercise and weight loss to help with fatigue. Proper sleep hygiene was discussed including the need for 7-8 hours of quality sleep each night. A sleep study was not ordered based  on symptoms and Epworth score.  Dyspnea on exertion Tammy Mooney's shortness of breath appears to be obesity related and exercise induced. She has agreed to work on weight loss and gradually increase exercise to treat her exercise induced shortness of breath. If Tammy Mooney follows our  instructions and loses weight without improvement of her shortness of breath, we will plan to refer to pulmonology. We will monitor this condition regularly. Tammy Mooney agrees to this plan.  Diabetes II Tammy Mooney has been given extensive diabetes education by myself today including ideal fasting and post-prandial blood glucose readings, individual ideal Hgb A1c goals  and hypoglycemia prevention. We discussed the importance of good blood sugar control to decrease the likelihood of diabetic complications such as nephropathy, neuropathy, limb loss, blindness, coronary artery disease, and death. We discussed the importance of intensive lifestyle modification including diet, exercise and weight loss as the first line treatment for diabetes. We will check labs and Tammy Mooney agrees to continue her diabetes medications and will follow up at the agreed upon time.  Depression Screen Tammy Mooney had a moderately positive depression screening. Depression is commonly associated with obesity and often results in emotional eating behaviors. We will monitor this closely and work on CBT to help improve the non-hunger eating patterns. Referral to Psychology may be required if no improvement is seen as she continues in our clinic.  Obesity Tammy Mooney is currently in the action stage of change and her goal is to continue with weight loss efforts She has agreed to follow the Category 3 plan +100 calories Tammy Mooney has been instructed to work up to a goal of 150 minutes of combined cardio and strengthening exercise per week for weight loss and overall health benefits. We discussed the following Behavioral Modification Strategies today: increasing lean protein  intake, decreasing simple carbohydrates  and decrease eating out  Tammy Mooney has agreed to follow up with our clinic in 2 weeks. She was informed of the importance of frequent follow up visits to maximize her success with intensive lifestyle modifications for her multiple health conditions. She was informed we would discuss her lab results at her next visit unless there is a critical issue that needs to be addressed sooner. Tammy Mooney agreed to keep her next visit at the agreed upon time to discuss these results.  I, Tammy Mooney, am acting as transcriptionist for Dennard Nip, MD  I have reviewed the above documentation for accuracy and completeness, and I agree with the above. -Dennard Nip, MD   OBESITY BEHAVIORAL INTERVENTION VISIT  Today's visit was # 1 out of 52.  Starting weight: 260 lbs Starting date: 02/09/17 Today's weight : 260 lbs Today's date: 02/09/2017 Total lbs lost to date: 0 (Patients must lose 7 lbs in the first 6 months to continue with counseling)   ASK: We discussed the diagnosis of obesity with Tammy Mooney [place] today and M[place] agreed to give [name] permission to discuss obesity behavioral modification therapy today.  ASSESS: Shaketha has the diagnosis of obesity and her BMI today is 42.1 Neilah is in the action stage of change   ADVISE: Eryka was educated on the multiple health risks of obesity as well as the benefit of weight loss to improve her health. She was advised of the need for long term treatment and the importance of lifestyle modifications.  AGREE: Multiple dietary modification options and treatment options were discussed and  Flavia agreed to follow the Category 3 plan +100 calories We discussed the following Behavioral Modification Strategies today: increasing lean protein intake, decreasing simple carbohydrates  and decrease eating out

## 2017-02-23 ENCOUNTER — Ambulatory Visit (INDEPENDENT_AMBULATORY_CARE_PROVIDER_SITE_OTHER): Payer: 59 | Admitting: Family Medicine

## 2017-02-23 VITALS — BP 106/73 | HR 75 | Temp 97.6°F | Ht 66.0 in | Wt 257.0 lb

## 2017-02-23 DIAGNOSIS — E7849 Other hyperlipidemia: Secondary | ICD-10-CM

## 2017-02-23 DIAGNOSIS — E669 Obesity, unspecified: Secondary | ICD-10-CM | POA: Diagnosis not present

## 2017-02-23 DIAGNOSIS — E785 Hyperlipidemia, unspecified: Secondary | ICD-10-CM

## 2017-02-23 DIAGNOSIS — E1169 Type 2 diabetes mellitus with other specified complication: Secondary | ICD-10-CM | POA: Insufficient documentation

## 2017-02-23 DIAGNOSIS — E119 Type 2 diabetes mellitus without complications: Secondary | ICD-10-CM

## 2017-02-23 DIAGNOSIS — Z9189 Other specified personal risk factors, not elsewhere classified: Secondary | ICD-10-CM | POA: Diagnosis not present

## 2017-02-23 DIAGNOSIS — Z6841 Body Mass Index (BMI) 40.0 and over, adult: Secondary | ICD-10-CM

## 2017-02-23 DIAGNOSIS — E559 Vitamin D deficiency, unspecified: Secondary | ICD-10-CM

## 2017-02-23 DIAGNOSIS — IMO0001 Reserved for inherently not codable concepts without codable children: Secondary | ICD-10-CM

## 2017-02-23 DIAGNOSIS — E784 Other hyperlipidemia: Secondary | ICD-10-CM

## 2017-02-23 MED ORDER — INSULIN PEN NEEDLE 32G X 4 MM MISC: 1.0000 | Freq: Every morning | 0 refills | Status: DC

## 2017-02-23 MED ORDER — VITAMIN D (ERGOCALCIFEROL) 1.25 MG (50000 UNIT) PO CAPS: 50000.0000 [IU] | ORAL_CAPSULE | ORAL | 0 refills | Status: DC

## 2017-02-23 MED ORDER — LIRAGLUTIDE 18 MG/3ML ~~LOC~~ SOPN: 0.6000 mg | PEN_INJECTOR | SUBCUTANEOUS | 0 refills | Status: DC

## 2017-02-24 ENCOUNTER — Telehealth (INDEPENDENT_AMBULATORY_CARE_PROVIDER_SITE_OTHER): Payer: Self-pay

## 2017-02-24 ENCOUNTER — Other Ambulatory Visit: Payer: Self-pay | Admitting: Family Medicine

## 2017-02-24 ENCOUNTER — Encounter (INDEPENDENT_AMBULATORY_CARE_PROVIDER_SITE_OTHER): Payer: Self-pay | Admitting: Family Medicine

## 2017-02-24 DIAGNOSIS — E119 Type 2 diabetes mellitus without complications: Secondary | ICD-10-CM

## 2017-02-24 MED FILL — VIT D2 1.25 MG (50,000 UNIT: 1.25 MG | 28 days supply | Qty: 4 | Fill #0

## 2017-02-24 MED FILL — METFORMIN HCL ER 750 MG TAB: 750 | 90 days supply | Qty: 180 | Fill #0

## 2017-02-24 MED FILL — NOVOFINE 32G NEEDLES: 32G X 6 MM | 90 days supply | Qty: 100 | Fill #0

## 2017-02-24 MED FILL — VICTOZA 2-PAK 18 MG/3 ML PE: 18 | 60 days supply | Qty: 6 | Fill #0

## 2017-02-24 NOTE — Progress Notes (Signed)
Office: [phone]  /  Fax: [phone]   HPI:   Chief Complaint: OBESITY Tammy Mooney is here to discuss her progress with her obesity treatment plan. She is on the Category 3 plan and is following her eating plan approximately 50 % of the time. She states she was swimming while on vacation for 20 minutes 5 times per week. Pranavi was on vacation part of the plan and still was able to lose weight. She struggled to eat all her protein in the evenings. Her weight is 257 lb (116.6 kg) today and has had a weight loss of 3 pounds over a period of 2 weeks since her last visit. She has lost 3 lbs since starting treatment with [name].  Hyperlipidemia Tammy Mooney has a new diagnosis of hyperlipidemia, HDL is low at 34 and LDL is elevated at 111 and she is attempting to improve her cholesterol levels with intensive lifestyle modification including a low saturated fat diet, exercise and weight loss. She denies any chest pain, claudication or myalgias.  Vitamin D deficiency Tammy Mooney has a new diagnosis of vitamin D deficiency. She is not currently taking vit D and admits fatigue but denies nausea, vomiting or muscle weakness.  Diabetes II Tammy Mooney has a diagnosis of diabetes type II. Tammy Mooney admits polyphagia and denies any hypoglycemic episodes. Last A1c was at 6.6 and she had an elevated fasting insulin >6. She has been working on intensive lifestyle modifications including diet, exercise, and weight loss to help control her blood glucose levels.  At risk for cardiovascular disease Tammy Mooney is at a higher than average risk for cardiovascular disease due to obesity, hyperlipidemia and diabetes. She currently denies any chest pain.  ALLERGIES: Allergies  Allergen Reactions  . Adhesive [Tape] Rash and Other (See Comments)    Plastic tape causes rash, paper tape is okay, tegaderm is ok  . Penicillins Rash    MEDICATIONS: Current Outpatient Prescriptions on File Prior to Visit  Medication Sig Dispense  Refill  . busPIRone (BUSPAR) 5 MG tablet Take 1 tablet (5 mg total) by mouth 2 (two) times daily as needed. 60 tablet 1  . cetirizine (ZYRTEC) 10 MG tablet Take 10 mg by mouth daily.    Marland Kitchen levothyroxine (SYNTHROID, LEVOTHROID) 88 MCG tablet Take 1 tablet (88 mcg total) by mouth daily before breakfast. 90 tablet 1  . metFORMIN (GLUCOPHAGE XR) 750 MG 24 hr tablet Take 2 tablets (1,500 mg total) by mouth daily with breakfast. 120 tablet 2  . MIRVASO 0.33 % GEL   2   No current facility-administered medications on file prior to visit.     PAST MEDICAL HISTORY: Past Medical History:  Diagnosis Date  . Diabetes mellitus without complication ([name])    glyburide  . H/O varicella   . Hypothyroidism   . Infections of genitourinary tract in pregnancy, unspecified as to episode of care(646.60)   . Infertility, female   . PCOS (polycystic ovarian syndrome)     PAST SURGICAL HISTORY: Past Surgical History:  Procedure Laterality Date  . CESAREAN SECTION    . CESAREAN SECTION  07/01/2012   Procedure: CESAREAN SECTION;  Surgeon: Lovenia Kim, MD;  Location: [facility];  Service: Obstetrics;  Laterality: N/A;  Repeat cesarean section with delivery of baby girl at 28. Apgars 9/9.  Marland Kitchen CESAREAN SECTION N/A 10/25/2014   Procedure: Repeat CESAREAN SECTION;  Surgeon: Brien Few, MD;  Location: [facility];  Service: Obstetrics;  Laterality: N/A;  EDD: 10/31/14  . EYE SURGERY  lasic  . HARDWARE REMOVAL Left 06/05/2016   Procedure: HARDWARE REMOVAL;  Surgeon: Margaretha Sheffield, MD;  Location: [place];  Service: ENT;  Laterality: Left;  HARDWARE REMOVED FROM LEFT MAXILLA  . IMAGE GUIDED SINUS SURGERY N/A 06/05/2016   Procedure: IMAGE GUIDED SINUS SURGERY;  Surgeon: Margaretha Sheffield, MD;  Location: [place];  Service: ENT;  Laterality: N/A;  Gave disk to cece on 11-15 kp  . MANDIBLE SURGERY    . MAXILLARY ANTROSTOMY Right 06/05/2016   Procedure: MAXILLARY ANTROSTOMY;  Surgeon: Margaretha Sheffield, MD;   Location: [place];  Service: ENT;  Laterality: Right;  . NASAL SINUS SURGERY    . SINUSOTOMY Left 06/05/2016   Procedure: SINUSOTOMY VIA CALDWELL LUC;  Surgeon: Margaretha Sheffield, MD;  Location: [place];  Service: ENT;  Laterality: Left;  Diabetic -- oral meds  . TONSILLECTOMY    . TURBINATE REDUCTION Bilateral 06/05/2016   Procedure: PARTIAL TURBINATE REDUCTION;  Surgeon: Margaretha Sheffield, MD;  Location: [place];  Service: ENT;  Laterality: Bilateral;    SOCIAL HISTORY: Social History  Substance Use Topics  . Smoking status: Never Smoker  . Smokeless tobacco: Never Used  . Alcohol use No    FAMILY HISTORY: Family History  Problem Relation Age of Onset  . Diabetes Mother   . Depression Mother   . Sleep apnea Mother   . Obesity Mother   . Heart attack Father   . Hypertension Father   . Hyperlipidemia Father   . Heart disease Father   . Heart disease Maternal Grandfather   . Diabetes Paternal Grandmother     ROS: Review of Systems  Constitutional: Positive for malaise/fatigue and weight loss.  Cardiovascular: Negative for chest pain and claudication.  Gastrointestinal: Negative for nausea and vomiting.  Musculoskeletal: Negative for myalgias.       Negative muscle weakness  Endo/Heme/Allergies:       Polyphagia Negative hypoglycemia    PHYSICAL EXAM: Blood pressure 106/73, pulse 75, temperature 97.6 F (36.4 C), temperature source Oral, height 5\' 6"  (1.676 m), weight 257 lb (116.6 kg), SpO2 99 %, unknown if currently breastfeeding. Body mass index is 41.48 kg/m. Physical Exam  Constitutional: She is oriented to person, place, and time. She appears well-developed and well-nourished.  Cardiovascular: Normal rate.   Pulmonary/Chest: Effort normal.  Musculoskeletal: Normal range of motion.  Neurological: She is oriented to person, place, and time.  Skin: Skin is warm and dry.  Psychiatric: She has a normal mood and affect. Her behavior is  normal.  Vitals reviewed.   RECENT LABS AND TESTS: BMET    Component Value Date/Time   NA 140 02/09/2017 1053   K 4.2 02/09/2017 1053   CL 102 02/09/2017 1053   CO2 21 02/09/2017 1053   GLUCOSE 95 02/09/2017 1053   GLUCOSE 104 (H) 07/09/2016 1213   BUN 13 02/09/2017 1053   CREATININE 0.62 02/09/2017 1053   CALCIUM 9.5 02/09/2017 1053   GFRNONAA 116 02/09/2017 1053   GFRAA 133 02/09/2017 1053   Lab Results  Component Value Date   HGBA1C 6.6 (H) 02/09/2017   HGBA1C 6.1 07/09/2016   HGBA1C 5.8 01/03/2016   HGBA1C 6.3 (H) 10/26/2014   HGBA1C 5.8 05/20/2013   Lab Results  Component Value Date   INSULIN 42.1 (H) 02/09/2017   CBC    Component Value Date/Time   WBC 8.7 02/09/2017 1053   WBC 10.6 (H) 10/26/2014 0545   RBC 4.91 02/09/2017 1053   RBC 4.47  10/26/2014 0545   HGB 14.0 02/09/2017 1053   HCT 41.6 02/09/2017 1053   PLT 394 (H) 02/09/2017 1053   MCV 85 02/09/2017 1053   MCH 28.5 02/09/2017 1053   MCH 26.8 10/26/2014 0545   MCHC 33.7 02/09/2017 1053   MCHC 33.7 10/26/2014 0545   RDW 14.2 02/09/2017 1053   LYMPHSABS 2.4 02/09/2017 1053   MONOABS 0.5 05/20/2013 0925   EOSABS 0.2 02/09/2017 1053   BASOSABS 0.0 02/09/2017 1053   Iron/TIBC/Ferritin/ %Sat No results found for: IRON, TIBC, FERRITIN, IRONPCTSAT Lipid Panel     Component Value Date/Time   CHOL 168 02/09/2017 1053   TRIG 117 02/09/2017 1053   HDL 34 (L) 02/09/2017 1053   CHOLHDL 5 05/20/2013 0925   VLDL 23.4 05/20/2013 0925   LDLCALC 111 (H) 02/09/2017 1053   Hepatic Function Panel     Component Value Date/Time   PROT 7.3 02/09/2017 1053   ALBUMIN 4.4 02/09/2017 1053   AST 27 02/09/2017 1053   ALT 62 (H) 02/09/2017 1053   ALKPHOS 76 02/09/2017 1053   BILITOT 0.5 02/09/2017 1053      Component Value Date/Time   TSH 5.250 (H) 02/09/2017 1053   TSH 4.08 07/09/2016 1213   TSH 2.87 01/03/2016 1425    ASSESSMENT AND PLAN: Other hyperlipidemia  Vitamin D deficiency - Plan: Vitamin D,  Ergocalciferol, (DRISDOL) 50000 units CAPS capsule  Type 2 diabetes mellitus without complication, without long-term current use of insulin ([name]) - Plan: liraglutide (VICTOZA) 18 MG/3ML SOPN, Insulin Pen Needle 32G X 4 MM MISC  At risk for diabetes mellitus  Class 3 obesity with serious comorbidity and body mass index (BMI) of 40.0 to 44.9 in adult, unspecified obesity type ([place])  PLAN:  Hyperlipidemia Tammy Mooney was informed of the American Heart Association Guidelines emphasizing intensive lifestyle modifications as the first line treatment for hyperlipidemia. We discussed many lifestyle modifications today in depth, and Tammy Mooney will continue to work on decreasing saturated fats such as fatty red meat, butter and many fried foods. She will also increase vegetables and lean protein in her diet and continue to work on exercise and weight loss efforts. We will re-check labs in 3 months and Tammy Mooney agrees to follow up as directed.  Vitamin D Deficiency Tammy Mooney was informed that low vitamin D levels contributes to fatigue and are associated with obesity, breast, and colon cancer. She agrees to start to take prescription Vit D @50 ,000 IU every week #4 with no refills and will follow up for routine testing of vitamin D, at least 2-3 times per year. She was informed of the risk of over-replacement of vitamin D and agrees to not increase her dose unless he discusses this with [name] first. Tammy Mooney agrees to follow up with our clinic in 2 weeks.  Diabetes II Tammy Mooney has been given extensive diabetes education by myself today including ideal fasting and post-prandial blood glucose readings, individual ideal Hgb A1c goals and hypoglycemia prevention. We discussed the importance of good blood sugar control to decrease the likelihood of diabetic complications such as nephropathy, neuropathy, limb loss, blindness, coronary artery disease, and death. We discussed the importance of intensive lifestyle modification  including diet, exercise and weight loss as the first line treatment for diabetes. Tammy Mooney agrees to continue metformin and start to take victoza 0.6 mg every morning #1 pen and nano needles with no refills and will follow up at the agreed upon time.  Cardiovascular risk counselling Tammy Mooney was given extended (30 minutes) coronary artery disease  prevention counseling today. She is 36 y.o. female and has risk factors for heart disease including obesity, hyperlipidemia and diabetes. We discussed intensive lifestyle modifications today with an emphasis on specific weight loss instructions and strategies. Pt was also informed of the importance of increasing exercise and decreasing saturated fats to help prevent heart disease.  Obesity Tammy Mooney is currently in the action stage of change. As such, her goal is to continue with weight loss efforts She has agreed to follow the Category 3 plan with dinner option Tammy Mooney has been instructed to work up to a goal of 150 minutes of combined cardio and strengthening exercise per week for weight loss and overall health benefits. We discussed the following Behavioral Modification Strategies today: keeping healthy foods in the home, increasing lean protein intake and decreasing simple carbohydrates   Tammy Mooney has agreed to follow up with our clinic in 2 weeks. She was informed of the importance of frequent follow up visits to maximize her success with intensive lifestyle modifications for her multiple health conditions.  I, Tammy Mooney, am acting as transcriptionist for Dennard Nip, MD  I have reviewed the above documentation for accuracy and completeness, and I agree with the above. -Dennard Nip, MD  OBESITY BEHAVIORAL INTERVENTION VISIT  Today's visit was # 2 out of 59.  Starting weight: 260 lbs Starting date: 02/09/17 Today's weight : 257 lbs  Today's date: 02/23/2017 Total lbs lost to date: 3 (Patients must lose 7 lbs in the first 6 months to continue  with counseling)   ASK: We discussed the diagnosis of obesity with Lorenza Cambridge today and Lennie agreed to give [name] permission to discuss obesity behavioral modification therapy today.  ASSESS: Nicolasa has the diagnosis of obesity and her BMI today is 41.6 Gale is in the action stage of change   ADVISE: Jacquese was educated on the multiple health risks of obesity as well as the benefit of weight loss to improve her health. She was advised of the need for long term treatment and the importance of lifestyle modifications.  AGREE: Multiple dietary modification options and treatment options were discussed and  Shavonna agreed to follow the Category 3 plan with dinner option We discussed the following Behavioral Modification Strategies today: keeping healthy foods in the home, increasing lean protein intake and decreasing simple carbohydrates

## 2017-02-26 NOTE — Telephone Encounter (Signed)
Spoke to Tammy Mooney, informed her that there were two refills left on her original prescription and the they dont expire until 08/11/17. Told her it would be better to follow up with the prescribing doctor. Betty Martinique, MD

## 2017-02-27 ENCOUNTER — Encounter (INDEPENDENT_AMBULATORY_CARE_PROVIDER_SITE_OTHER): Payer: Self-pay | Admitting: Family Medicine

## 2017-03-11 ENCOUNTER — Ambulatory Visit (INDEPENDENT_AMBULATORY_CARE_PROVIDER_SITE_OTHER): Payer: 59 | Admitting: Family Medicine

## 2017-03-11 VITALS — BP 110/74 | HR 85 | Temp 97.8°F | Ht 66.0 in | Wt 254.0 lb

## 2017-03-11 DIAGNOSIS — E669 Obesity, unspecified: Secondary | ICD-10-CM | POA: Diagnosis not present

## 2017-03-11 DIAGNOSIS — IMO0001 Reserved for inherently not codable concepts without codable children: Secondary | ICD-10-CM

## 2017-03-11 DIAGNOSIS — Z9189 Other specified personal risk factors, not elsewhere classified: Secondary | ICD-10-CM

## 2017-03-11 DIAGNOSIS — E119 Type 2 diabetes mellitus without complications: Secondary | ICD-10-CM

## 2017-03-11 DIAGNOSIS — Z6841 Body Mass Index (BMI) 40.0 and over, adult: Secondary | ICD-10-CM

## 2017-03-11 NOTE — Progress Notes (Signed)
Office: [phone]  /  Fax: [phone]   HPI:   Chief Complaint: OBESITY Tammy Mooney is here to discuss her progress with her obesity treatment plan. She is on the  follow the Category 3 plan + 300 and is following her eating plan approximately 60 % of the time. She states she is exercising 0 minutes 0 times per week. Tammy Mooney continues to do well with weight loss on the Category 3 plan but is deviating from her plan more. She notes she had some mild increase in emotional eating.  Her weight is 254 lb (115.2 kg) today and has had a weight loss of 3 pounds over a period of 2 weeks since her last visit. She has lost 6 lbs since starting treatment with [name].  Tammy Mooney has a diagnosis of Tammy type II. Tammy Mooney states Tammy Mooney does not report any check sugars nor report any hypoglycemic episodes. Last A1c was Hemoglobin A1C Latest Ref Rng & Units 02/09/2017 07/09/2016  HGBA1C 4.8 - 5.6 % 6.6(H) 6.1  Some recent data might be hidden  Tammy Mooney started Victoza, noted some mild nausea but this has improved. She also notes a decrease in polyphagia and sometimes has a difficult time eating all her food.   She has been working on intensive lifestyle modifications including diet, exercise, and weight loss to help control her blood glucose levels.  At risk for cardiovascular disease Tammy Mooney is at a higher than average risk for cardiovascular disease due to obesity. She currently denies any chest pain.  ALLERGIES: Allergies  Allergen Reactions  . Adhesive [Tape] Rash and Other (See Comments)    Plastic tape causes rash, paper tape is okay, tegaderm is ok  . Penicillins Rash    MEDICATIONS: Current Outpatient Prescriptions on File Prior to Visit  Medication Sig Dispense Refill  . busPIRone (BUSPAR) 5 MG tablet Take 1 tablet (5 mg total) by mouth 2 (two) times daily as needed. 60 tablet 1  . cetirizine (ZYRTEC) 10 MG tablet Take 10 mg by mouth daily.    . Insulin Pen Needle 32G X 4 MM  MISC 1 Package by Does not apply route every morning. 100 each 0  . levothyroxine (SYNTHROID, LEVOTHROID) 88 MCG tablet Take 1 tablet (88 mcg total) by mouth daily before breakfast. 90 tablet 1  . liraglutide (VICTOZA) 18 MG/3ML SOPN Inject 0.1 mLs (0.6 mg total) into the skin every morning. 1 pen 0  . metFORMIN (GLUCOPHAGE-XR) 750 MG 24 hr tablet TAKE 2 TABLETS BY MOUTH DAILY WITH BREAKFAST. 180 tablet 1  . MIRVASO 0.33 % GEL   2  . Vitamin D, Ergocalciferol, (DRISDOL) 50000 units CAPS capsule Take 1 capsule (50,000 Units total) by mouth every 7 (seven) days. 4 capsule 0   No current facility-administered medications on file prior to visit.     PAST MEDICAL HISTORY: Past Medical History:  Diagnosis Date  . Tammy mellitus without complication ([name])    glyburide  . H/O varicella   . Hypothyroidism   . Infections of genitourinary tract in pregnancy, unspecified as to episode of care(646.60)   . Infertility, female   . PCOS (polycystic ovarian syndrome)     PAST SURGICAL HISTORY: Past Surgical History:  Procedure Laterality Date  . CESAREAN SECTION    . CESAREAN SECTION  07/01/2012   Procedure: CESAREAN SECTION;  Surgeon: Lovenia Kim, MD;  Location: [facility];  Service: Obstetrics;  Laterality: N/A;  Repeat cesarean section with delivery of baby girl at 64. Apgars 9/9.  Marland Kitchen  CESAREAN SECTION N/A 10/25/2014   Procedure: Repeat CESAREAN SECTION;  Surgeon: Brien Few, MD;  Location: [facility];  Service: Obstetrics;  Laterality: N/A;  EDD: 10/31/14  . EYE SURGERY     lasic  . HARDWARE REMOVAL Left 06/05/2016   Procedure: HARDWARE REMOVAL;  Surgeon: Margaretha Sheffield, MD;  Location: [place];  Service: ENT;  Laterality: Left;  HARDWARE REMOVED FROM LEFT MAXILLA  . IMAGE GUIDED SINUS SURGERY N/A 06/05/2016   Procedure: IMAGE GUIDED SINUS SURGERY;  Surgeon: Margaretha Sheffield, MD;  Location: [place];  Service: ENT;  Laterality: N/A;  Gave disk to cece on 11-15 kp  . MANDIBLE  SURGERY    . MAXILLARY ANTROSTOMY Right 06/05/2016   Procedure: MAXILLARY ANTROSTOMY;  Surgeon: Margaretha Sheffield, MD;  Location: [place];  Service: ENT;  Laterality: Right;  . NASAL SINUS SURGERY    . SINUSOTOMY Left 06/05/2016   Procedure: SINUSOTOMY VIA CALDWELL LUC;  Surgeon: Margaretha Sheffield, MD;  Location: [place];  Service: ENT;  Laterality: Left;  Diabetic -- oral meds  . TONSILLECTOMY    . TURBINATE REDUCTION Bilateral 06/05/2016   Procedure: PARTIAL TURBINATE REDUCTION;  Surgeon: Margaretha Sheffield, MD;  Location: [place];  Service: ENT;  Laterality: Bilateral;    SOCIAL HISTORY: Social History  Substance Use Topics  . Smoking status: Never Smoker  . Smokeless tobacco: Never Used  . Alcohol use No    FAMILY HISTORY: Family History  Problem Relation Age of Onset  . Tammy Mother   . Depression Mother   . Sleep apnea Mother   . Obesity Mother   . Heart attack Father   . Hypertension Father   . Hyperlipidemia Father   . Heart disease Father   . Heart disease Maternal Grandfather   . Tammy Paternal Grandmother     ROS: Review of Systems  Constitutional: Positive for weight loss.  Gastrointestinal: Positive for nausea.  All other systems reviewed and are negative.   PHYSICAL EXAM: Blood pressure 110/74, pulse 85, temperature 97.8 F (36.6 C), temperature source Oral, height 5\' 6"  (1.676 m), weight 254 lb (115.2 kg), SpO2 99 %, unknown if currently breastfeeding. Body mass index is 41 kg/m. Physical Exam  Constitutional: She is oriented to person, place, and time. She appears well-developed and well-nourished.  HENT:  Head: Normocephalic.  Eyes: EOM are normal.  Neck: Normal range of motion.  Pulmonary/Chest: Effort normal.  Musculoskeletal: Normal range of motion.  Neurological: She is alert and oriented to person, place, and time.  Skin: Skin is warm and dry.  Vitals reviewed.   RECENT LABS AND TESTS: BMET    Component  Value Date/Time   NA 140 02/09/2017 1053   K 4.2 02/09/2017 1053   CL 102 02/09/2017 1053   CO2 21 02/09/2017 1053   GLUCOSE 95 02/09/2017 1053   GLUCOSE 104 (H) 07/09/2016 1213   BUN 13 02/09/2017 1053   CREATININE 0.62 02/09/2017 1053   CALCIUM 9.5 02/09/2017 1053   GFRNONAA 116 02/09/2017 1053   GFRAA 133 02/09/2017 1053   Lab Results  Component Value Date   HGBA1C 6.6 (H) 02/09/2017   HGBA1C 6.1 07/09/2016   HGBA1C 5.8 01/03/2016   HGBA1C 6.3 (H) 10/26/2014   HGBA1C 5.8 05/20/2013   Lab Results  Component Value Date   INSULIN 42.1 (H) 02/09/2017   CBC    Component Value Date/Time   WBC 8.7 02/09/2017 1053   WBC 10.6 (H) 10/26/2014 0545   RBC 4.91  02/09/2017 1053   RBC 4.47 10/26/2014 0545   HGB 14.0 02/09/2017 1053   HCT 41.6 02/09/2017 1053   PLT 394 (H) 02/09/2017 1053   MCV 85 02/09/2017 1053   MCH 28.5 02/09/2017 1053   MCH 26.8 10/26/2014 0545   MCHC 33.7 02/09/2017 1053   MCHC 33.7 10/26/2014 0545   RDW 14.2 02/09/2017 1053   LYMPHSABS 2.4 02/09/2017 1053   MONOABS 0.5 05/20/2013 0925   EOSABS 0.2 02/09/2017 1053   BASOSABS 0.0 02/09/2017 1053   Iron/TIBC/Ferritin/ %Sat No results found for: IRON, TIBC, FERRITIN, IRONPCTSAT Lipid Panel     Component Value Date/Time   CHOL 168 02/09/2017 1053   TRIG 117 02/09/2017 1053   HDL 34 (L) 02/09/2017 1053   CHOLHDL 5 05/20/2013 0925   VLDL 23.4 05/20/2013 0925   LDLCALC 111 (H) 02/09/2017 1053   Hepatic Function Panel     Component Value Date/Time   PROT 7.3 02/09/2017 1053   ALBUMIN 4.4 02/09/2017 1053   AST 27 02/09/2017 1053   ALT 62 (H) 02/09/2017 1053   ALKPHOS 76 02/09/2017 1053   BILITOT 0.5 02/09/2017 1053      Component Value Date/Time   TSH 5.250 (H) 02/09/2017 1053   TSH 4.08 07/09/2016 1213   TSH 2.87 01/03/2016 1425    ASSESSMENT AND PLAN: Type 2 Tammy mellitus without complication, without long-term current use of insulin ([name])  At risk for heart disease  Class 3  obesity with serious comorbidity and body mass index (BMI) of 40.0 to 44.9 in adult, unspecified obesity type ([place])  PLAN:  Tammy II Tammy Mooney has been given extensive Tammy education by myself today including ideal fasting and post-prandial blood glucose readings, individual ideal HgA1c goals  and hypoglycemia prevention. We discussed the importance of good blood sugar control to decrease the likelihood of diabetic complications such as nephropathy, neuropathy, limb loss, blindness, coronary artery disease, and death. We discussed the importance of intensive lifestyle modification including diet, exercise and weight loss as the first line treatment for Tammy. Tammy Mooney agrees to continue her Tammy medications and will follow up at the agreed upon time. She agreed to continue the Victoza at 0.6 mg daily and continue with diet/weight loss.   Cardiovascular risk counselling Tammy Mooney was given extended (15 minutes) coronary artery disease prevention counseling today. She is 35 y.o. female and has risk factors for heart disease including obesity. We discussed intensive lifestyle modifications today with an emphasis on specific weight loss instructions and strategies. Pt was also informed of the importance of increasing exercise and decreasing saturated fats to help prevent heart disease.  Obesity Tammy Mooney is currently in the action stage of change. As such, her goal is to continue with weight loss efforts She has agreed to follow the Category 3 plan +300 Tammy Mooney has been instructed to work up to a goal of 150 minutes of combined cardio and strengthening exercise per week for weight loss and overall health benefits. We discussed the following Behavioral Modification Stratagies today: increasing lean protein intake, decreasing simple carbohydrates , work on meal planning and easy cooking plans and emotional eating strategies  Tammy Mooney has agreed to follow up with our clinic in 2 weeks. She was  informed of the importance of frequent follow up visits to maximize her success with intensive lifestyle modifications for her multiple health conditions.  Nani Gasser , am acting as transcriptionist for Dennard Nip, MD  I have reviewed the above documentation for accuracy and completeness, and I agree with  the above. -Dennard Nip, MD

## 2017-03-24 ENCOUNTER — Telehealth: Payer: Self-pay | Admitting: Family Medicine

## 2017-03-24 NOTE — Telephone Encounter (Signed)
Patient would like to transfer PCP, wanted okay from providers first.

## 2017-03-24 NOTE — Telephone Encounter (Signed)
That is Tammy Mooney

## 2017-03-24 NOTE — Telephone Encounter (Signed)
Sure

## 2017-03-25 ENCOUNTER — Ambulatory Visit (INDEPENDENT_AMBULATORY_CARE_PROVIDER_SITE_OTHER): Payer: 59 | Admitting: Family Medicine

## 2017-03-25 VITALS — BP 99/68 | HR 79 | Temp 97.6°F | Ht 66.0 in | Wt 250.0 lb

## 2017-03-25 DIAGNOSIS — E669 Obesity, unspecified: Secondary | ICD-10-CM

## 2017-03-25 DIAGNOSIS — E119 Type 2 diabetes mellitus without complications: Secondary | ICD-10-CM

## 2017-03-25 DIAGNOSIS — IMO0001 Reserved for inherently not codable concepts without codable children: Secondary | ICD-10-CM

## 2017-03-25 DIAGNOSIS — Z6841 Body Mass Index (BMI) 40.0 and over, adult: Secondary | ICD-10-CM

## 2017-03-25 DIAGNOSIS — Z9189 Other specified personal risk factors, not elsewhere classified: Secondary | ICD-10-CM

## 2017-03-25 NOTE — Progress Notes (Signed)
Office: [phone]  /  Fax: [phone]   HPI:   Chief Complaint: OBESITY Tammy Mooney is here to discuss her progress with her obesity treatment plan. She is on the Category 3 plan + 300 calories and is following her eating plan approximately 50 % of the time. She states she is exercising 0 minutes 0 times per week. Tammy Mooney continues to do well with weight loss. She is struggling with planning meals ahead of time with kids being back in school and with increased after school activities.  Her weight is 250 lb (113.4 kg) today and has had a weight loss of 4 pounds over a period of 2 weeks since her last visit. She has lost 10 lbs since starting treatment with [name].  Diabetes II Tammy Mooney has a diagnosis of diabetes type II. Tammy Mooney is currently on victoza and metformin. She is doing well but notes polyphagia is elevated with increased simple carbohydrates. She denies any hypoglycemic episodes. Last A1c was at 6.6 Hemoglobin A1C Latest Ref Rng & Units 02/09/2017 07/09/2016  HGBA1C 4.8 - 5.6 % 6.6(H) 6.1  Some recent data might be hidden    She has been working on intensive lifestyle modifications including diet, exercise, and weight loss to help control her blood glucose levels.  At risk for cardiovascular disease Tammy Mooney is at a higher than average risk for cardiovascular disease due to obesity. She currently denies any chest pain. ALLERGIES: Allergies  Allergen Reactions  . Adhesive [Tape] Rash and Other (See Comments)    Plastic tape causes rash, paper tape is okay, tegaderm is ok  . Penicillins Rash    MEDICATIONS: Current Outpatient Prescriptions on File Prior to Visit  Medication Sig Dispense Refill  . busPIRone (BUSPAR) 5 MG tablet Take 1 tablet (5 mg total) by mouth 2 (two) times daily as needed. 60 tablet 1  . cetirizine (ZYRTEC) 10 MG tablet Take 10 mg by mouth daily.    . Insulin Pen Needle 32G X 4 MM MISC 1 Package by Does not apply route every morning. 100 each 0  .  levothyroxine (SYNTHROID, LEVOTHROID) 88 MCG tablet Take 1 tablet (88 mcg total) by mouth daily before breakfast. 90 tablet 1  . metFORMIN (GLUCOPHAGE-XR) 750 MG 24 hr tablet TAKE 2 TABLETS BY MOUTH DAILY WITH BREAKFAST. 180 tablet 1  . MIRVASO 0.33 % GEL   2  . Vitamin D, Ergocalciferol, (DRISDOL) 50000 units CAPS capsule Take 1 capsule (50,000 Units total) by mouth every 7 (seven) days. 4 capsule 0   No current facility-administered medications on file prior to visit.     PAST MEDICAL HISTORY: Past Medical History:  Diagnosis Date  . Diabetes mellitus without complication ([name])    glyburide  . H/O varicella   . Hypothyroidism   . Infections of genitourinary tract in pregnancy, unspecified as to episode of care(646.60)   . Infertility, female   . PCOS (polycystic ovarian syndrome)     PAST SURGICAL HISTORY: Past Surgical History:  Procedure Laterality Date  . CESAREAN SECTION    . CESAREAN SECTION  07/01/2012   Procedure: CESAREAN SECTION;  Surgeon: Lovenia Kim, MD;  Location: [facility];  Service: Obstetrics;  Laterality: N/A;  Repeat cesarean section with delivery of baby girl at 35. Apgars 9/9.  Marland Kitchen CESAREAN SECTION N/A 10/25/2014   Procedure: Repeat CESAREAN SECTION;  Surgeon: Brien Few, MD;  Location: [facility];  Service: Obstetrics;  Laterality: N/A;  EDD: 10/31/14  . EYE SURGERY     lasic  .  HARDWARE REMOVAL Left 06/05/2016   Procedure: HARDWARE REMOVAL;  Surgeon: Margaretha Sheffield, MD;  Location: [place];  Service: ENT;  Laterality: Left;  HARDWARE REMOVED FROM LEFT MAXILLA  . IMAGE GUIDED SINUS SURGERY N/A 06/05/2016   Procedure: IMAGE GUIDED SINUS SURGERY;  Surgeon: Margaretha Sheffield, MD;  Location: [place];  Service: ENT;  Laterality: N/A;  Gave disk to cece on 11-15 kp  . MANDIBLE SURGERY    . MAXILLARY ANTROSTOMY Right 06/05/2016   Procedure: MAXILLARY ANTROSTOMY;  Surgeon: Margaretha Sheffield, MD;  Location: [place];  Service: ENT;  Laterality:  Right;  . NASAL SINUS SURGERY    . SINUSOTOMY Left 06/05/2016   Procedure: SINUSOTOMY VIA CALDWELL LUC;  Surgeon: Margaretha Sheffield, MD;  Location: [place];  Service: ENT;  Laterality: Left;  Diabetic -- oral meds  . TONSILLECTOMY    . TURBINATE REDUCTION Bilateral 06/05/2016   Procedure: PARTIAL TURBINATE REDUCTION;  Surgeon: Margaretha Sheffield, MD;  Location: [place];  Service: ENT;  Laterality: Bilateral;    SOCIAL HISTORY: Social History  Substance Use Topics  . Smoking status: Never Smoker  . Smokeless tobacco: Never Used  . Alcohol use No    FAMILY HISTORY: Family History  Problem Relation Age of Onset  . Diabetes Mother   . Depression Mother   . Sleep apnea Mother   . Obesity Mother   . Heart attack Father   . Hypertension Father   . Hyperlipidemia Father   . Heart disease Father   . Heart disease Maternal Grandfather   . Diabetes Paternal Grandmother     ROS: Review of Systems  Constitutional: Positive for weight loss.  Genitourinary: Negative for frequency.  Endo/Heme/Allergies: Negative for polydipsia.       Polyphagia Negative hypoglycemia    PHYSICAL EXAM: Blood pressure 99/68, pulse 79, temperature 97.6 F (36.4 C), temperature source Oral, height 5\' 6"  (1.676 m), weight 250 lb (113.4 kg), SpO2 98 %, unknown if currently breastfeeding. Body mass index is 40.35 kg/m. Physical Exam  Constitutional: She is oriented to person, place, and time. She appears well-developed and well-nourished.  Cardiovascular: Normal rate.   Pulmonary/Chest: Effort normal.  Musculoskeletal: Normal range of motion.  Neurological: She is oriented to person, place, and time.  Skin: Skin is warm and dry.  Psychiatric: She has a normal mood and affect. Her behavior is normal.  Vitals reviewed.   RECENT LABS AND TESTS: BMET    Component Value Date/Time   NA 140 02/09/2017 1053   K 4.2 02/09/2017 1053   CL 102 02/09/2017 1053   CO2 21 02/09/2017 1053    GLUCOSE 95 02/09/2017 1053   GLUCOSE 104 (H) 07/09/2016 1213   BUN 13 02/09/2017 1053   CREATININE 0.62 02/09/2017 1053   CALCIUM 9.5 02/09/2017 1053   GFRNONAA 116 02/09/2017 1053   GFRAA 133 02/09/2017 1053   Lab Results  Component Value Date   HGBA1C 6.6 (H) 02/09/2017   HGBA1C 6.1 07/09/2016   HGBA1C 5.8 01/03/2016   HGBA1C 6.3 (H) 10/26/2014   HGBA1C 5.8 05/20/2013   Lab Results  Component Value Date   INSULIN 42.1 (H) 02/09/2017   CBC    Component Value Date/Time   WBC 8.7 02/09/2017 1053   WBC 10.6 (H) 10/26/2014 0545   RBC 4.91 02/09/2017 1053   RBC 4.47 10/26/2014 0545   HGB 14.0 02/09/2017 1053   HCT 41.6 02/09/2017 1053   PLT 394 (H) 02/09/2017 1053   MCV 85 02/09/2017  [place] 28.5 02/09/2017 1053   MCH 26.8 10/26/2014 0545   MCHC 33.7 02/09/2017 1053   MCHC 33.7 10/26/2014 0545   RDW 14.2 02/09/2017 1053   LYMPHSABS 2.4 02/09/2017 1053   MONOABS 0.5 05/20/2013 0925   EOSABS 0.2 02/09/2017 1053   BASOSABS 0.0 02/09/2017 1053   Iron/TIBC/Ferritin/ %Sat No results found for: IRON, TIBC, FERRITIN, IRONPCTSAT Lipid Panel     Component Value Date/Time   CHOL 168 02/09/2017 1053   TRIG 117 02/09/2017 1053   HDL 34 (L) 02/09/2017 1053   CHOLHDL 5 05/20/2013 0925   VLDL 23.4 05/20/2013 0925   LDLCALC 111 (H) 02/09/2017 1053   Hepatic Function Panel     Component Value Date/Time   PROT 7.3 02/09/2017 1053   ALBUMIN 4.4 02/09/2017 1053   AST 27 02/09/2017 1053   ALT 62 (H) 02/09/2017 1053   ALKPHOS 76 02/09/2017 1053   BILITOT 0.5 02/09/2017 1053      Component Value Date/Time   TSH 5.250 (H) 02/09/2017 1053   TSH 4.08 07/09/2016 1213   TSH 2.87 01/03/2016 1425    ASSESSMENT AND PLAN: Type 2 diabetes mellitus without complication, without long-term current use of insulin ([name])  At risk for diabetes mellitus  Class 3 obesity with serious comorbidity and body mass index (BMI) of 40.0 to 44.9 in adult, unspecified obesity type  ([place])  PLAN:  Diabetes II Tammy Mooney has been given extensive diabetes education by myself today including ideal fasting and post-prandial blood glucose readings, individual ideal Hgb A1c goals  and hypoglycemia prevention. We discussed the importance of good blood sugar control to decrease the likelihood of diabetic complications such as nephropathy, neuropathy, limb loss, blindness, coronary artery disease, and death. We discussed the importance of intensive lifestyle modification including diet, exercise and weight loss as the first line treatment for diabetes. Tammy Mooney agrees to increase victoza to 0.9 mg qd (no refills needed) and decrease simple carbohydrates. She will follow up with our clinic in 2 to 3 weeks. Tammy Mooney agrees to continue with diet and exercise to help control diabetes.  Cardiovascular risk counselling Tammy Mooney was given extended (15 minutes) coronary artery disease prevention counseling today. She is 36 y.o. female and has risk factors for heart disease including obesity. We discussed intensive lifestyle modifications today with an emphasis on specific weight loss instructions and strategies. Pt was also informed of the importance of increasing exercise and decreasing saturated fats to help prevent heart disease.   Obesity Tammy Mooney is currently in the action stage of change. As such, her goal is to continue with weight loss efforts She has agreed to change to keep a food journal with 350-550 calories and 35 grams of protein at supper daily and follow the Category 3 plan Tammy Mooney has been instructed to work up to a goal of 150 minutes of combined cardio and strengthening exercise per week for weight loss and overall health benefits. We discussed the following Behavioral Modification Strategies today: decreasing simple carbohydrates    Tammy Mooney has agreed to follow up with our clinic in 2 to 3 weeks. She was informed of the importance of frequent follow up visits to maximize her  success with intensive lifestyle modifications for her multiple health conditions.  I, Trixie Dredge, am acting as transcriptionist for Dennard Nip, MD  I have reviewed the above documentation for accuracy and completeness, and I agree with the above. -Dennard Nip, MD     OBESITY BEHAVIORAL INTERVENTION VISIT  Today's visit was #  4 out of 22.  Starting weight: 260 lbs Starting date: 02/09/17 Today's weight: 250 lbs  Today's date: 03/25/17 Total lbs lost to date: 10 (Patients must lose 7 lbs in the first 6 months to continue with counseling)   ASK: We discussed the diagnosis of obesity with Tammy Mooney today and Tammy Mooney agreed to give [name] permission to discuss obesity behavioral modification therapy today.  ASSESS: Tammy Mooney has the diagnosis of obesity and her BMI today is 27 Tammy Mooney is in the action stage of change   ADVISE: Tammy Mooney was educated on the multiple health risks of obesity as well as the benefit of weight loss to improve her health. She was advised of the need for long term treatment and the importance of lifestyle modifications.  AGREE: Multiple dietary modification options and treatment options were discussed and  Tammy Mooney agreed to keep a food journal with 350-550 calories and 35 grams of protein at supper daily and follow the Category 3 plan. We discussed the following Behavioral Modification Strategies today: decreasing simple carbohydrates

## 2017-04-02 MED FILL — PREVIDENT 5000 BOOSTER PLUS: 1.1 | 30 days supply | Qty: 100 | Fill #0

## 2017-04-02 MED FILL — CHLORHEXIDINE 0.12% RINSE: 0.12 | 16 days supply | Qty: 473 | Fill #0

## 2017-04-08 ENCOUNTER — Ambulatory Visit (INDEPENDENT_AMBULATORY_CARE_PROVIDER_SITE_OTHER): Payer: 59 | Admitting: Physician Assistant

## 2017-04-08 VITALS — BP 97/70 | HR 86 | Temp 98.0°F | Ht 66.0 in | Wt 252.0 lb

## 2017-04-08 DIAGNOSIS — E038 Other specified hypothyroidism: Secondary | ICD-10-CM | POA: Diagnosis not present

## 2017-04-08 DIAGNOSIS — E119 Type 2 diabetes mellitus without complications: Secondary | ICD-10-CM | POA: Diagnosis not present

## 2017-04-08 DIAGNOSIS — E559 Vitamin D deficiency, unspecified: Secondary | ICD-10-CM

## 2017-04-08 DIAGNOSIS — Z9189 Other specified personal risk factors, not elsewhere classified: Secondary | ICD-10-CM | POA: Diagnosis not present

## 2017-04-08 DIAGNOSIS — Z6841 Body Mass Index (BMI) 40.0 and over, adult: Secondary | ICD-10-CM | POA: Diagnosis not present

## 2017-04-08 DIAGNOSIS — E669 Obesity, unspecified: Secondary | ICD-10-CM

## 2017-04-08 DIAGNOSIS — IMO0001 Reserved for inherently not codable concepts without codable children: Secondary | ICD-10-CM

## 2017-04-08 MED ORDER — VITAMIN D (ERGOCALCIFEROL) 1.25 MG (50000 UNIT) PO CAPS: 50000.0000 [IU] | ORAL_CAPSULE | ORAL | 0 refills | Status: DC

## 2017-04-08 MED ORDER — LIRAGLUTIDE 18 MG/3ML ~~LOC~~ SOPN: 0.6000 mg | PEN_INJECTOR | Freq: Once | SUBCUTANEOUS | 0 refills | Status: DC

## 2017-04-08 MED ORDER — LEVOTHYROXINE SODIUM 88 MCG PO TABS: 88.0000 ug | ORAL_TABLET | Freq: Every day | ORAL | 0 refills | Status: DC

## 2017-04-08 MED FILL — VIT D2 1.25 MG (50,000 UNIT: 1.25 MG | 28 days supply | Qty: 4 | Fill #0

## 2017-04-08 MED FILL — LEVOTHYROXINE 88 MCG TABLET: 88 | 30 days supply | Qty: 30 | Fill #0

## 2017-04-08 NOTE — Progress Notes (Signed)
Office: [phone]  /  Fax: [phone]   HPI:   Chief Complaint: OBESITY Tammy Mooney is here to discuss her progress with her obesity treatment plan. She is on the  keep a food journal with 350 to 550 calories and 35 grams of protein at supper daily and follow the Category 3 plan and is following her eating plan approximately 50 % of the time. She states she is exercising 0 minutes 0 times per week. [name] has increased emotional eating and she tried to make smarter food choices and control her portions. Her weight is 252 lb (114.3 kg) today and has had a weight gain of 2 lbs over a period of 2 weeks since her last visit. She has lost 8 lbs since starting treatment with [name].  Vitamin D deficiency Tammy Mooney has a diagnosis of vitamin D deficiency. She is currently taking vit D and denies nausea, vomiting or muscle weakness.  At risk for osteopenia and osteoporosis Tammy Mooney is at higher risk of osteopenia and osteoporosis due to vitamin D deficiency.   Diabetes II Tammy Mooney has a diagnosis of diabetes type II. Tammy Mooney states she is not checkiing fasting BGs at home and denies any hypoglycemic episodes. She has been working on intensive lifestyle modifications including diet, exercise, and weight loss to help control her blood glucose levels.  Hypothyroid Tammy Mooney has a diagnosis of hypothyroidism. She is on synthroid. She denies hot or cold intolerance or palpitations, but does admit to ongoing fatigue.  ALLERGIES: Allergies  Allergen Reactions  . Adhesive [Tape] Rash and Other (See Comments)    Plastic tape causes rash, paper tape is okay, tegaderm is ok  . Penicillins Rash    MEDICATIONS: Current Outpatient Prescriptions on File Prior to Visit  Medication Sig Dispense Refill  . busPIRone (BUSPAR) 5 MG tablet Take 1 tablet (5 mg total) by mouth 2 (two) times daily as needed. 60 tablet 1  . cetirizine (ZYRTEC) 10 MG tablet Take 10 mg by mouth daily.    . Insulin Pen Needle 32G X 4 MM  MISC 1 Package by Does not apply route every morning. 100 each 0  . metFORMIN (GLUCOPHAGE-XR) 750 MG 24 hr tablet TAKE 2 TABLETS BY MOUTH DAILY WITH BREAKFAST. 180 tablet 1  . MIRVASO 0.33 % GEL   2   No current facility-administered medications on file prior to visit.     PAST MEDICAL HISTORY: Past Medical History:  Diagnosis Date  . Diabetes mellitus without complication ([name])    glyburide  . H/O varicella   . Hypothyroidism   . Infections of genitourinary tract in pregnancy, unspecified as to episode of care(646.60)   . Infertility, female   . PCOS (polycystic ovarian syndrome)     PAST SURGICAL HISTORY: Past Surgical History:  Procedure Laterality Date  . CESAREAN SECTION    . CESAREAN SECTION  07/01/2012   Procedure: CESAREAN SECTION;  Surgeon: Lovenia Kim, MD;  Location: [facility];  Service: Obstetrics;  Laterality: N/A;  Repeat cesarean section with delivery of baby girl at 30. Apgars 9/9.  Marland Kitchen CESAREAN SECTION N/A 10/25/2014   Procedure: Repeat CESAREAN SECTION;  Surgeon: Brien Few, MD;  Location: [facility];  Service: Obstetrics;  Laterality: N/A;  EDD: 10/31/14  . EYE SURGERY     lasic  . HARDWARE REMOVAL Left 06/05/2016   Procedure: HARDWARE REMOVAL;  Surgeon: Margaretha Sheffield, MD;  Location: [place];  Service: ENT;  Laterality: Left;  HARDWARE REMOVED FROM LEFT MAXILLA  . IMAGE GUIDED  SINUS SURGERY N/A 06/05/2016   Procedure: IMAGE GUIDED SINUS SURGERY;  Surgeon: Margaretha Sheffield, MD;  Location: [place];  Service: ENT;  Laterality: N/A;  Gave disk to cece on 11-15 kp  . MANDIBLE SURGERY    . MAXILLARY ANTROSTOMY Right 06/05/2016   Procedure: MAXILLARY ANTROSTOMY;  Surgeon: Margaretha Sheffield, MD;  Location: [place];  Service: ENT;  Laterality: Right;  . NASAL SINUS SURGERY    . SINUSOTOMY Left 06/05/2016   Procedure: SINUSOTOMY VIA CALDWELL LUC;  Surgeon: Margaretha Sheffield, MD;  Location: [place];  Service: ENT;  Laterality: Left;   Diabetic -- oral meds  . TONSILLECTOMY    . TURBINATE REDUCTION Bilateral 06/05/2016   Procedure: PARTIAL TURBINATE REDUCTION;  Surgeon: Margaretha Sheffield, MD;  Location: [place];  Service: ENT;  Laterality: Bilateral;    SOCIAL HISTORY: Social History  Substance Use Topics  . Smoking status: Never Smoker  . Smokeless tobacco: Never Used  . Alcohol use No    FAMILY HISTORY: Family History  Problem Relation Age of Onset  . Diabetes Mother   . Depression Mother   . Sleep apnea Mother   . Obesity Mother   . Heart attack Father   . Hypertension Father   . Hyperlipidemia Father   . Heart disease Father   . Heart disease Maternal Grandfather   . Diabetes Paternal Grandmother     ROS: Review of Systems  Constitutional: Positive for malaise/fatigue. Negative for weight loss.  Cardiovascular: Negative for palpitations.  Gastrointestinal: Negative for nausea and vomiting.  Musculoskeletal:       Negative muscle weakness  Endo/Heme/Allergies:       Negative Heat/Cold Intolerance    PHYSICAL EXAM: Blood pressure 97/70, pulse 86, temperature 98 F (36.7 C), temperature source Oral, height 5\' 6"  (1.676 m), weight 252 lb (114.3 kg), SpO2 98 %, unknown if currently breastfeeding. Body mass index is 40.67 kg/m. Physical Exam  Constitutional: She is oriented to person, place, and time. She appears well-developed and well-nourished.  Cardiovascular: Normal rate.   Pulmonary/Chest: Effort normal.  Musculoskeletal: Normal range of motion.  Neurological: She is oriented to person, place, and time.  Skin: Skin is warm and dry.  Psychiatric: She has a normal mood and affect. Her behavior is normal.  Vitals reviewed.   RECENT LABS AND TESTS: BMET    Component Value Date/Time   NA 140 02/09/2017 1053   K 4.2 02/09/2017 1053   CL 102 02/09/2017 1053   CO2 21 02/09/2017 1053   GLUCOSE 95 02/09/2017 1053   GLUCOSE 104 (H) 07/09/2016 1213   BUN 13 02/09/2017 1053    CREATININE 0.62 02/09/2017 1053   CALCIUM 9.5 02/09/2017 1053   GFRNONAA 116 02/09/2017 1053   GFRAA 133 02/09/2017 1053   Lab Results  Component Value Date   HGBA1C 6.6 (H) 02/09/2017   HGBA1C 6.1 07/09/2016   HGBA1C 5.8 01/03/2016   HGBA1C 6.3 (H) 10/26/2014   HGBA1C 5.8 05/20/2013   Lab Results  Component Value Date   INSULIN 42.1 (H) 02/09/2017   CBC    Component Value Date/Time   WBC 8.7 02/09/2017 1053   WBC 10.6 (H) 10/26/2014 0545   RBC 4.91 02/09/2017 1053   RBC 4.47 10/26/2014 0545   HGB 14.0 02/09/2017 1053   HCT 41.6 02/09/2017 1053   PLT 394 (H) 02/09/2017 1053   MCV 85 02/09/2017 1053   MCH 28.5 02/09/2017 1053   MCH 26.8 10/26/2014 0545   MCHC 33.7  02/09/2017 1053   MCHC 33.7 10/26/2014 0545   RDW 14.2 02/09/2017 1053   LYMPHSABS 2.4 02/09/2017 1053   MONOABS 0.5 05/20/2013 0925   EOSABS 0.2 02/09/2017 1053   BASOSABS 0.0 02/09/2017 1053   Iron/TIBC/Ferritin/ %Sat No results found for: IRON, TIBC, FERRITIN, IRONPCTSAT Lipid Panel     Component Value Date/Time   CHOL 168 02/09/2017 1053   TRIG 117 02/09/2017 1053   HDL 34 (L) 02/09/2017 1053   CHOLHDL 5 05/20/2013 0925   VLDL 23.4 05/20/2013 0925   LDLCALC 111 (H) 02/09/2017 1053   Hepatic Function Panel     Component Value Date/Time   PROT 7.3 02/09/2017 1053   ALBUMIN 4.4 02/09/2017 1053   AST 27 02/09/2017 1053   ALT 62 (H) 02/09/2017 1053   ALKPHOS 76 02/09/2017 1053   BILITOT 0.5 02/09/2017 1053      Component Value Date/Time   TSH 5.250 (H) 02/09/2017 1053   TSH 4.08 07/09/2016 1213   TSH 2.87 01/03/2016 1425    ASSESSMENT AND PLAN: Type 2 diabetes mellitus without complication, without long-term current use of insulin (Tammy Mooney) - Plan: liraglutide 18 MG/3ML SOPN  Vitamin D deficiency - Plan: Vitamin D, Ergocalciferol, (DRISDOL) 50000 units CAPS capsule  Other specified hypothyroidism - Plan: levothyroxine (SYNTHROID, LEVOTHROID) 88 MCG tablet  At risk for osteoporosis  Class  3 obesity with serious comorbidity and body mass index (BMI) of 40.0 to 44.9 in adult, unspecified obesity type (Tammy Mooney)  PLAN:  Vitamin D Deficiency Tammy Mooney was informed that low vitamin D levels contributes to fatigue and are associated with obesity, breast, and colon cancer. She agrees to continue to take prescription Vit D @50 ,000 IU every week and will follow up for routine testing of vitamin D, at least 2-3 times per year. She was informed of the risk of over-replacement of vitamin D and agrees to not increase her dose unless he discusses this with [name] first.  At risk for osteopenia and osteoporosis Tammy Mooney is at risk for osteopenia and osteoporosis due to her vitamin D deficiency. She was encouraged to take her vitamin D and follow her higher calcium diet and increase strengthening exercise to help strengthen her bones and decrease her risk of osteopenia and osteoporosis.  Diabetes II Tammy Mooney has been given extensive diabetes education by myself today including ideal fasting and post-prandial blood glucose readings, individual ideal Hgb A1c goals  and hypoglycemia prevention. We discussed the importance of good blood sugar control to decrease the likelihood of diabetic complications such as nephropathy, neuropathy, limb loss, blindness, coronary artery disease, and death. We discussed the importance of intensive lifestyle modification including diet, exercise and weight loss as the first line treatment for diabetes. Tammy Mooney agrees to continue victoza, we will refill for 2 pens Tammy Mooney will increase victoza to 0.9 mg) and will follow up at the agreed upon time.  Hypothyroid Tammy Mooney was informed of the importance of good thyroid control to help with weight loss efforts. She was also informed that supertheraputic thyroid levels are dangerous and will not improve weight loss results. Tammy Mooney agrees to continue Synthroid, we will refill for synthroid 88 mcg #30 with no refills and she will follow up as  directed.  Obesity Tammy Mooney is currently in the action stage of change. As such, her goal is to continue with weight loss efforts She has agreed to follow the Category 3 plan Tammy Mooney has been instructed to work up to a goal of 150 minutes of combined cardio and strengthening exercise per  week for weight loss and overall health benefits. We discussed the following Behavioral Modification Strategies today: increasing lean protein intake and work on meal planning and easy cooking plans  Tammy Mooney has agreed to follow up with our clinic in 2 weeks. She was informed of the importance of frequent follow up visits to maximize her success with intensive lifestyle modifications for her multiple health conditions.  I, Doreene Nest, am acting as transcriptionist for Tammy Duverney, PA-C  I have reviewed the above documentation for accuracy and completeness, and I agree with the above. -Tammy Duverney, PA-C  I have reviewed the above note and agree with the plan. -Dennard Nip, MD   OBESITY BEHAVIORAL INTERVENTION VISIT  Today's visit was # 5 out of 22.  Starting weight: 260 lbs Starting date: 02/09/17 Today's weight : 252 lbs Today's date: 04/08/2017 Total lbs lost to date: 8 (Patients must lose 7 lbs in the first 6 months to continue with counseling)   ASK: We discussed the diagnosis of obesity with Lorenza Cambridge today and Ruqayyah agreed to give [name] permission to discuss obesity behavioral modification therapy today.  ASSESS: Kaysee has the diagnosis of obesity and her BMI today is 40.69 Paulyne is in the action stage of change   ADVISE: Mihaela was educated on the multiple health risks of obesity as well as the benefit of weight loss to improve her health. She was advised of the need for long term treatment and the importance of lifestyle modifications.  AGREE: Multiple dietary modification options and treatment options were discussed and  Sakeena agreed to follow the Category 3 plan We  discussed the following Behavioral Modification Strategies today: increasing lean protein intake and work on meal planning and easy cooking plans

## 2017-04-09 ENCOUNTER — Ambulatory Visit (INDEPENDENT_AMBULATORY_CARE_PROVIDER_SITE_OTHER): Payer: 59 | Admitting: Family Medicine

## 2017-04-09 ENCOUNTER — Encounter: Payer: Self-pay | Admitting: Family Medicine

## 2017-04-22 ENCOUNTER — Ambulatory Visit (INDEPENDENT_AMBULATORY_CARE_PROVIDER_SITE_OTHER): Payer: 59 | Admitting: Physician Assistant

## 2017-04-22 VITALS — BP 102/69 | HR 84 | Temp 98.1°F | Ht 66.0 in | Wt 249.0 lb

## 2017-04-22 DIAGNOSIS — Z9189 Other specified personal risk factors, not elsewhere classified: Secondary | ICD-10-CM

## 2017-04-22 DIAGNOSIS — E119 Type 2 diabetes mellitus without complications: Secondary | ICD-10-CM

## 2017-04-22 DIAGNOSIS — E559 Vitamin D deficiency, unspecified: Secondary | ICD-10-CM

## 2017-04-22 DIAGNOSIS — Z6841 Body Mass Index (BMI) 40.0 and over, adult: Secondary | ICD-10-CM | POA: Diagnosis not present

## 2017-04-22 MED ORDER — LIRAGLUTIDE 18 MG/3ML ~~LOC~~ SOPN: 0.6000 mg | PEN_INJECTOR | Freq: Once | SUBCUTANEOUS | 0 refills | Status: DC

## 2017-04-22 MED ORDER — VITAMIN D (ERGOCALCIFEROL) 1.25 MG (50000 UNIT) PO CAPS: 50000.0000 [IU] | ORAL_CAPSULE | ORAL | 0 refills | Status: DC

## 2017-04-22 NOTE — Progress Notes (Signed)
Office: [phone]  /  Fax: [phone]   HPI:   Chief Complaint: OBESITY Tammy Mooney is here to discuss her progress with her obesity treatment plan. She is on the  follow the Category 3 plan and is following her eating plan approximately 50 % of the time. She states she is exercising 0 minutes 0 times per week. Tammy Mooney continues to do well with weight loss. She plans her meals well and states hunger is well controlled. Her weight is 249 lb (112.9 kg) today and has had a weight loss of 3 pounds over a period of 3 weeks since her last visit. She has lost 11 lbs since starting treatment with [name].  Vitamin D deficiency Tammy Mooney has a diagnosis of vitamin D deficiency. She is currently taking vit D and denies nausea, vomiting or muscle weakness.  Diabetes II Tammy Mooney has a diagnosis of diabetes type II. Tammy Mooney states she is not checking her blood sugars at home. She denies any hypoglycemic episodes. Last A1c was 6.6 on 02/09/17.   She has been working on intensive lifestyle modifications including diet, exercise, and weight loss to help control her blood glucose levels.    ALLERGIES: Allergies  Allergen Reactions  . Adhesive [Tape] Rash and Other (See Comments)    Plastic tape causes rash, paper tape is okay, tegaderm is ok  . Penicillins Rash    MEDICATIONS: Current Outpatient Prescriptions on File Prior to Visit  Medication Sig Dispense Refill  . busPIRone (BUSPAR) 5 MG tablet Take 1 tablet (5 mg total) by mouth 2 (two) times daily as needed. 60 tablet 1  . cetirizine (ZYRTEC) 10 MG tablet Take 10 mg by mouth daily.    . chlorhexidine (PERIDEX) 0.12 % solution Use as directed 15 mLs in the mouth or throat 2 (two) times daily.    . Insulin Pen Needle 32G X 4 MM MISC 1 Package by Does not apply route every morning. 100 each 0  . levothyroxine (SYNTHROID, LEVOTHROID) 88 MCG tablet Take 1 tablet (88 mcg total) by mouth daily before breakfast. 30 tablet 0  . metFORMIN (GLUCOPHAGE-XR) 750  MG 24 hr tablet TAKE 2 TABLETS BY MOUTH DAILY WITH BREAKFAST. 180 tablet 1  . MIRVASO 0.33 % GEL   2   No current facility-administered medications on file prior to visit.     PAST MEDICAL HISTORY: Past Medical History:  Diagnosis Date  . Diabetes mellitus without complication ([name])    glyburide  . H/O varicella   . Hypothyroidism   . Infections of genitourinary tract in pregnancy, unspecified as to episode of care(646.60)   . Infertility, female   . PCOS (polycystic ovarian syndrome)     PAST SURGICAL HISTORY: Past Surgical History:  Procedure Laterality Date  . CESAREAN SECTION    . CESAREAN SECTION  07/01/2012   Procedure: CESAREAN SECTION;  Surgeon: Lovenia Kim, MD;  Location: [facility];  Service: Obstetrics;  Laterality: N/A;  Repeat cesarean section with delivery of baby girl at 41. Apgars 9/9.  Marland Kitchen CESAREAN SECTION N/A 10/25/2014   Procedure: Repeat CESAREAN SECTION;  Surgeon: Brien Few, MD;  Location: [facility];  Service: Obstetrics;  Laterality: N/A;  EDD: 10/31/14  . EYE SURGERY     lasic  . HARDWARE REMOVAL Left 06/05/2016   Procedure: HARDWARE REMOVAL;  Surgeon: Margaretha Sheffield, MD;  Location: [facility];  Service: ENT;  Laterality: Left;  HARDWARE REMOVED FROM LEFT MAXILLA  . IMAGE GUIDED SINUS SURGERY N/A 06/05/2016   Procedure: IMAGE  GUIDED SINUS SURGERY;  Surgeon: Margaretha Sheffield, MD;  Location: [facility];  Service: ENT;  Laterality: N/A;  Gave disk to cece on 11-15 kp  . MANDIBLE SURGERY    . MAXILLARY ANTROSTOMY Right 06/05/2016   Procedure: MAXILLARY ANTROSTOMY;  Surgeon: Margaretha Sheffield, MD;  Location: [facility];  Service: ENT;  Laterality: Right;  . NASAL SINUS SURGERY    . SINUSOTOMY Left 06/05/2016   Procedure: SINUSOTOMY VIA CALDWELL LUC;  Surgeon: Margaretha Sheffield, MD;  Location: [place] [facility];  Service: ENT;  Laterality: Left;  Diabetic -- oral meds  . TONSILLECTOMY    . TURBINATE REDUCTION Bilateral 06/05/2016   Procedure:  PARTIAL TURBINATE REDUCTION;  Surgeon: Margaretha Sheffield, MD;  Location: [facility];  Service: ENT;  Laterality: Bilateral;    SOCIAL HISTORY: Social History  Substance Use Topics  . Smoking status: Never Smoker  . Smokeless tobacco: Never Used  . Alcohol use No    FAMILY HISTORY: Family History  Problem Relation Age of Onset  . Diabetes Mother   . Depression Mother   . Sleep apnea Mother   . Obesity Mother   . Heart attack Father   . Hypertension Father   . Hyperlipidemia Father   . Heart disease Father   . Heart disease Maternal Grandfather   . Diabetes Paternal Grandmother     ROS: Review of Systems  Constitutional: Positive for weight loss.  Gastrointestinal: Negative for abdominal pain and vomiting.  Musculoskeletal:       Negative muscle weakness  Endo/Heme/Allergies:       Negative hypoglycemia    PHYSICAL EXAM: Blood pressure 102/69, pulse 84, temperature 98.1 F (36.7 C), temperature source Oral, height 5\' 6"  (1.676 m), weight 249 lb (112.9 kg), SpO2 98 %, unknown if currently breastfeeding. Body mass index is 40.19 kg/m. Physical Exam  Constitutional: She is oriented to person, place, and time. She appears well-developed and well-nourished.  Cardiovascular: Normal rate.   Pulmonary/Chest: Effort normal.  Neurological: She is alert and oriented to person, place, and time.  Skin: Skin is warm and dry.  Psychiatric: She has a normal mood and affect.  Vitals reviewed.   RECENT LABS AND TESTS: BMET    Component Value Date/Time   NA 140 02/09/2017 1053   K 4.2 02/09/2017 1053   CL 102 02/09/2017 1053   CO2 21 02/09/2017 1053   GLUCOSE 95 02/09/2017 1053   GLUCOSE 104 (H) 07/09/2016 1213   BUN 13 02/09/2017 1053   CREATININE 0.62 02/09/2017 1053   CALCIUM 9.5 02/09/2017 1053   GFRNONAA 116 02/09/2017 1053   GFRAA 133 02/09/2017 1053   Lab Results  Component Value Date   HGBA1C 6.6 (H) 02/09/2017   HGBA1C 6.1 07/09/2016   HGBA1C 5.8  01/03/2016   HGBA1C 6.3 (H) 10/26/2014   HGBA1C 5.8 05/20/2013   Lab Results  Component Value Date   INSULIN 42.1 (H) 02/09/2017   CBC    Component Value Date/Time   WBC 8.7 02/09/2017 1053   WBC 10.6 (H) 10/26/2014 0545   RBC 4.91 02/09/2017 1053   RBC 4.47 10/26/2014 0545   HGB 14.0 02/09/2017 1053   HCT 41.6 02/09/2017 1053   PLT 394 (H) 02/09/2017 1053   MCV 85 02/09/2017 1053   MCH 28.5 02/09/2017 1053   MCH 26.8 10/26/2014 0545   MCHC 33.7 02/09/2017 1053   MCHC 33.7 10/26/2014 0545   RDW 14.2 02/09/2017 1053   LYMPHSABS 2.4 02/09/2017 1053   MONOABS 0.5  05/20/2013 0925   EOSABS 0.2 02/09/2017 1053   BASOSABS 0.0 02/09/2017 1053   Iron/TIBC/Ferritin/ %Sat No results found for: IRON, TIBC, FERRITIN, IRONPCTSAT Lipid Panel     Component Value Date/Time   CHOL 168 02/09/2017 1053   TRIG 117 02/09/2017 1053   HDL 34 (L) 02/09/2017 1053   CHOLHDL 5 05/20/2013 0925   VLDL 23.4 05/20/2013 0925   LDLCALC 111 (H) 02/09/2017 1053   Hepatic Function Panel     Component Value Date/Time   PROT 7.3 02/09/2017 1053   ALBUMIN 4.4 02/09/2017 1053   AST 27 02/09/2017 1053   ALT 62 (H) 02/09/2017 1053   ALKPHOS 76 02/09/2017 1053   BILITOT 0.5 02/09/2017 1053      Component Value Date/Time   TSH 5.250 (H) 02/09/2017 1053   TSH 4.08 07/09/2016 1213   TSH 2.87 01/03/2016 1425    ASSESSMENT AND PLAN: Vitamin D deficiency - Plan: Vitamin D, Ergocalciferol, (DRISDOL) 50000 units CAPS capsule  Type 2 diabetes mellitus without complication, without long-term current use of insulin ([name]) - Plan: liraglutide 18 MG/3ML SOPN  At risk for osteopenia  Class 3 severe obesity with serious comorbidity and body mass index (BMI) of 40.0 to 44.9 in adult, unspecified obesity type ([place])  PLAN:  Vitamin D Deficiency Tammy Mooney was informed that low vitamin D levels contributes to fatigue and are associated with obesity, breast, and colon cancer. She agrees to continue to take  prescription Vit D @50 ,000 IU every week, a refill is written for 1 month,  and will follow up for routine testing of vitamin D, at least 2-3 times per year. She was informed of the risk of over-replacement of vitamin D and agrees to not increase her dose unless he discusses this with [name] first.  Diabetes II Tammy Mooney has been given extensive diabetes education by myself today including ideal fasting and post-prandial blood glucose readings, individual ideal HgA1c goals  and hypoglycemia prevention. We discussed the importance of good blood sugar control to decrease the likelihood of diabetic complications such as nephropathy, neuropathy, limb loss, blindness, coronary artery disease, and death. We discussed the importance of intensive lifestyle modification including diet, exercise and weight loss as the first line treatment for diabetes. Paquita agrees to continue taking Victoza, a refill is written today,  a  and will follow up at the agreed upon time.  At risk for osteopenia and osteoporosis Indria is at risk for osteopenia and osteoporsis due to her vitamin D deficiency. She was encouraged to take her vitamin D and follow her higher calcium diet and increase strengthening exercise to help strengthen her bones and decrease her risk of osteopenia and osteoporosis.  Obesity Tammy Mooney is currently in the action stage of change. As such, her goal is to continue with weight loss efforts She has agreed to follow the Category 3 plan Tammy Mooney has been instructed to work up to a goal of 150 minutes of combined cardio and strengthening exercise per week for weight loss and overall health benefits. We discussed the following Behavioral Modification Stratagies today: increasing lean protein intake and work on meal planning and easy cooking plans   Tammy Mooney has agreed to follow up with our clinic in 3 weeks. She was informed of the importance of frequent follow up visits to maximize her success with intensive  lifestyle modifications for her multiple health conditions.   Office: [phone]  /  Fax: [phone]  OBESITY BEHAVIORAL INTERVENTION VISIT  Today's visit was # 6 out of 22.  Starting weight: 260 Starting date: 02/09/17 Today's weight : Weight: 249 lb (112.9 kg)  Today's date: 04/22/2017 Total lbs lost to date: 11 (Patients must lose 7 lbs in the first 6 months to continue with counseling)   ASK: We discussed the diagnosis of obesity with Tammy Mooney today and Tammy Mooney agreed to give [name] permission to discuss obesity behavioral modification therapy today.  ASSESS: Tammy Mooney has the diagnosis of obesity and her BMI today is 40.2 Tammy Mooney is in the action stage of change   ADVISE: Tammy Mooney was educated on the multiple health risks of obesity as well as the benefit of weight loss to improve her health. She was advised of the need for long term treatment and the importance of lifestyle modifications.  AGREE: Multiple dietary modification options and treatment options were discussed and  Tammy Mooney agreed to follow the Category 3 plan We discussed the following Behavioral Modification Stratagies today: increasing lean protein intake and work on meal planning and easy cooking plans     I have reviewed the above documentation for accuracy and completeness, and I agree with the above. -Lacy Duverney, PA-C  I have reviewed the above note and agree with the plan. -Dennard Nip, MD

## 2017-04-23 ENCOUNTER — Telehealth (INDEPENDENT_AMBULATORY_CARE_PROVIDER_SITE_OTHER): Payer: Self-pay | Admitting: Physician Assistant

## 2017-04-23 NOTE — Telephone Encounter (Signed)
victoza needs clairfication on prescription [facility] [phone]

## 2017-04-24 MED FILL — VICTOZA 2-PAK 18 MG/3 ML PE: 18 | 59 days supply | Qty: 6 | Fill #0

## 2017-04-27 NOTE — Telephone Encounter (Signed)
Patient picked the prescription up. [facility], [place]

## 2017-04-27 NOTE — Telephone Encounter (Signed)
complete

## 2017-04-30 MED FILL — VIT D2 1.25 MG (50,000 UNIT: 1.25 MG | 28 days supply | Qty: 4 | Fill #0

## 2017-05-07 MED FILL — LEVOTHYROXINE 88 MCG TABLET: 88 | 90 days supply | Qty: 90 | Fill #1

## 2017-05-12 ENCOUNTER — Ambulatory Visit (INDEPENDENT_AMBULATORY_CARE_PROVIDER_SITE_OTHER): Payer: 59 | Admitting: Family Medicine

## 2017-05-12 VITALS — BP 100/69 | HR 92 | Temp 97.7°F | Ht 66.0 in | Wt 245.0 lb

## 2017-05-12 DIAGNOSIS — E038 Other specified hypothyroidism: Secondary | ICD-10-CM | POA: Diagnosis not present

## 2017-05-12 DIAGNOSIS — E7849 Other hyperlipidemia: Secondary | ICD-10-CM | POA: Diagnosis not present

## 2017-05-12 DIAGNOSIS — E119 Type 2 diabetes mellitus without complications: Secondary | ICD-10-CM | POA: Diagnosis not present

## 2017-05-12 DIAGNOSIS — E559 Vitamin D deficiency, unspecified: Secondary | ICD-10-CM | POA: Diagnosis not present

## 2017-05-12 DIAGNOSIS — Z9189 Other specified personal risk factors, not elsewhere classified: Secondary | ICD-10-CM

## 2017-05-12 DIAGNOSIS — Z6839 Body mass index (BMI) 39.0-39.9, adult: Secondary | ICD-10-CM | POA: Diagnosis not present

## 2017-05-12 NOTE — Progress Notes (Signed)
Office: [phone]  /  Fax: [phone]   HPI:   Chief Complaint: OBESITY Tammy Mooney is here to discuss her progress with her obesity treatment plan. She is on the Category 3 plan and is following her eating plan approximately 50 % of the time. She states she is exercising 0 minutes 0 times per week. Tammy Mooney continues to do well with weight loss, but is deviating from her plan more and is not eating a much lean protein or vegetables. Her weight is 245 lb (111.1 kg) today and has had a weight loss of 4 pounds over a period of 3 weeks since her last visit. She has lost 15 lbs since starting treatment with [name].  Vitamin D deficiency Tammy Mooney has a diagnosis of vitamin D deficiency. She is currently taking vit D prescription and is due for labs. Fatigue is improving and she denies nausea, vomiting or muscle weakness.  Hyperlipidemia Tammy Mooney has hyperlipidemia and is not currently on a statin. She is attempting to improve her cholesterol levels with intensive lifestyle modification including a low saturated fat diet, exercise and weight loss. She denies any chest pain, claudication or myalgias.  Diabetes II Tammy Mooney has a diagnosis of diabetes type II and is on metformin and victoza 1.2 and she is due for labs. Tammy Mooney states she is not checking BGs at home and denies any hypoglycemic episodes. She has been working on intensive lifestyle modifications including diet, exercise, and weight loss to help control her blood glucose levels.  At risk for cardiovascular disease [name] is at a higher than average risk for cardiovascular disease due to obesity, hyperlipidemia and diabetes. She currently denies any chest pain.  Hypothyroid Tammy Mooney has a diagnosis of hypothyroidism. She is on synthroid and fatigue is improved. She denies hot or cold intolerance or palpitations.   ALLERGIES: Allergies  Allergen Reactions  . Adhesive [Tape] Rash and Other (See Comments)    Plastic tape causes rash, paper  tape is okay, tegaderm is ok  . Penicillins Rash    MEDICATIONS: Current Outpatient Prescriptions on File Prior to Visit  Medication Sig Dispense Refill  . busPIRone (BUSPAR) 5 MG tablet Take 1 tablet (5 mg total) by mouth 2 (two) times daily as needed. 60 tablet 1  . cetirizine (ZYRTEC) 10 MG tablet Take 10 mg by mouth daily.    . chlorhexidine (PERIDEX) 0.12 % solution Use as directed 15 mLs in the mouth or throat 2 (two) times daily.    . Insulin Pen Needle 32G X 4 MM MISC 1 Package by Does not apply route every morning. 100 each 0  . levothyroxine (SYNTHROID, LEVOTHROID) 88 MCG tablet Take 1 tablet (88 mcg total) by mouth daily before breakfast. 30 tablet 0  . metFORMIN (GLUCOPHAGE-XR) 750 MG 24 hr tablet TAKE 2 TABLETS BY MOUTH DAILY WITH BREAKFAST. 180 tablet 1  . MIRVASO 0.33 % GEL   2  . Vitamin D, Ergocalciferol, (DRISDOL) 50000 units CAPS capsule Take 1 capsule (50,000 Units total) by mouth every 7 (seven) days. 4 capsule 0  . liraglutide 18 MG/3ML SOPN Inject 0.1 mLs (0.6 mg total) into the skin once. 2 pen 0   No current facility-administered medications on file prior to visit.     PAST MEDICAL HISTORY: Past Medical History:  Diagnosis Date  . Diabetes mellitus without complication ([name])    glyburide  . H/O varicella   . Hypothyroidism   . Infections of genitourinary tract in pregnancy, unspecified as to episode of care(646.60)   .  Infertility, female   . PCOS (polycystic ovarian syndrome)     PAST SURGICAL HISTORY: Past Surgical History:  Procedure Laterality Date  . CESAREAN SECTION    . CESAREAN SECTION  07/01/2012   Procedure: CESAREAN SECTION;  Surgeon: Lovenia Kim, MD;  Location: [facility];  Service: Obstetrics;  Laterality: N/A;  Repeat cesarean section with delivery of baby girl at 7. Apgars 9/9.  Marland Kitchen CESAREAN SECTION N/A 10/25/2014   Procedure: Repeat CESAREAN SECTION;  Surgeon: Brien Few, MD;  Location: [facility];  Service: Obstetrics;  Laterality: N/A;   EDD: 10/31/14  . EYE SURGERY     lasic  . HARDWARE REMOVAL Left 06/05/2016   Procedure: HARDWARE REMOVAL;  Surgeon: Margaretha Sheffield, MD;  Location: [facility];  Service: ENT;  Laterality: Left;  HARDWARE REMOVED FROM LEFT MAXILLA  . IMAGE GUIDED SINUS SURGERY N/A 06/05/2016   Procedure: IMAGE GUIDED SINUS SURGERY;  Surgeon: Margaretha Sheffield, MD;  Location: [facility];  Service: ENT;  Laterality: N/A;  Gave disk to Tammy Mooney on 11-15 kp  . MANDIBLE SURGERY    . MAXILLARY ANTROSTOMY Right 06/05/2016   Procedure: MAXILLARY ANTROSTOMY;  Surgeon: Margaretha Sheffield, MD;  Location: [facility];  Service: ENT;  Laterality: Right;  . NASAL SINUS SURGERY    . SINUSOTOMY Left 06/05/2016   Procedure: SINUSOTOMY VIA CALDWELL LUC;  Surgeon: Margaretha Sheffield, MD;  Location: [facility];  Service: ENT;  Laterality: Left;  Diabetic -- oral meds  . TONSILLECTOMY    . TURBINATE REDUCTION Bilateral 06/05/2016   Procedure: PARTIAL TURBINATE REDUCTION;  Surgeon: Margaretha Sheffield, MD;  Location: [facility];  Service: ENT;  Laterality: Bilateral;    SOCIAL HISTORY: Social History  Substance Use Topics  . Smoking status: Never Smoker  . Smokeless tobacco: Never Used  . Alcohol use No    FAMILY HISTORY: Family History  Problem Relation Age of Onset  . Diabetes Mother   . Depression Mother   . Sleep apnea Mother   . Obesity Mother   . Heart attack Father   . Hypertension Father   . Hyperlipidemia Father   . Heart disease Father   . Heart disease Maternal Grandfather   . Diabetes Paternal Grandmother     ROS: Review of Systems  Constitutional: Positive for malaise/fatigue and weight loss.  Cardiovascular: Negative for chest pain, palpitations and claudication.  Gastrointestinal: Negative for nausea and vomiting.  Musculoskeletal: Negative for myalgias.       Negative muscle weakness  Endo/Heme/Allergies:       Negative Heat/Cold Intolerance    PHYSICAL EXAM: Blood pressure  100/69, pulse 92, temperature 97.7 F (36.5 C), temperature source Oral, height 5\' 6"  (1.676 m), weight 245 lb (111.1 kg), SpO2 98 %, unknown if currently breastfeeding. Body mass index is 39.54 kg/m. Physical Exam  Constitutional: She is oriented to person, place, and time. She appears well-developed and well-nourished.  Cardiovascular: Normal rate.   Pulmonary/Chest: Effort normal.  Musculoskeletal: Normal range of motion.  Neurological: She is oriented to person, place, and time.  Skin: Skin is warm and dry.  Psychiatric: She has a normal mood and affect. Her behavior is normal.  Vitals reviewed.   RECENT LABS AND TESTS: BMET    Component Value Date/Time   NA 140 02/09/2017 1053   K 4.2 02/09/2017 1053   CL 102 02/09/2017 1053   CO2 21 02/09/2017 1053   GLUCOSE 95 02/09/2017 1053   GLUCOSE 104 (H) 07/09/2016 1213   BUN  13 02/09/2017 1053   CREATININE 0.62 02/09/2017 1053   CALCIUM 9.5 02/09/2017 1053   GFRNONAA 116 02/09/2017 1053   GFRAA 133 02/09/2017 1053   Lab Results  Component Value Date   HGBA1C 6.6 (H) 02/09/2017   HGBA1C 6.1 07/09/2016   HGBA1C 5.8 01/03/2016   HGBA1C 6.3 (H) 10/26/2014   HGBA1C 5.8 05/20/2013   Lab Results  Component Value Date   INSULIN 42.1 (H) 02/09/2017   CBC    Component Value Date/Time   WBC 8.7 02/09/2017 1053   WBC 10.6 (H) 10/26/2014 0545   RBC 4.91 02/09/2017 1053   RBC 4.47 10/26/2014 0545   HGB 14.0 02/09/2017 1053   HCT 41.6 02/09/2017 1053   PLT 394 (H) 02/09/2017 1053   MCV 85 02/09/2017 1053   MCH 28.5 02/09/2017 1053   MCH 26.8 10/26/2014 0545   MCHC 33.7 02/09/2017 1053   MCHC 33.7 10/26/2014 0545   RDW 14.2 02/09/2017 1053   LYMPHSABS 2.4 02/09/2017 1053   MONOABS 0.5 05/20/2013 0925   EOSABS 0.2 02/09/2017 1053   BASOSABS 0.0 02/09/2017 1053   Iron/TIBC/Ferritin/ %Sat No results found for: IRON, TIBC, FERRITIN, IRONPCTSAT Lipid Panel     Component Value Date/Time   CHOL 168 02/09/2017 1053   TRIG  117 02/09/2017 1053   HDL 34 (L) 02/09/2017 1053   CHOLHDL 5 05/20/2013 0925   VLDL 23.4 05/20/2013 0925   LDLCALC 111 (H) 02/09/2017 1053   Hepatic Function Panel     Component Value Date/Time   PROT 7.3 02/09/2017 1053   ALBUMIN 4.4 02/09/2017 1053   AST 27 02/09/2017 1053   ALT 62 (H) 02/09/2017 1053   ALKPHOS 76 02/09/2017 1053   BILITOT 0.5 02/09/2017 1053      Component Value Date/Time   TSH 5.250 (H) 02/09/2017 1053   TSH 4.08 07/09/2016 1213   TSH 2.87 01/03/2016 1425    ASSESSMENT AND PLAN: Type 2 diabetes mellitus without complication, without long-term current use of insulin ([name]) - Plan: CBC With Differential, Comprehensive metabolic panel, Hemoglobin A1c, Insulin, random  Other specified hypothyroidism - Plan: T3, T4, free, TSH  Vitamin D deficiency - Plan: Vitamin B12, Folate, VITAMIN D 25 Hydroxy (Vit-D Deficiency, Fractures)  Other hyperlipidemia - Plan: Lipid Panel With LDL/HDL Ratio  At risk for heart disease  Class 2 severe obesity with serious comorbidity and body mass index (BMI) of 39.0 to 39.9 in adult, unspecified obesity type ([place])  PLAN:  Vitamin D Deficiency Jaylyne was informed that low vitamin D levels contributes to fatigue and are associated with obesity, breast, and colon cancer. She agrees to continue to take prescription Vit D @50 ,000 IU every week. We will check labs and will follow up for routine testing of vitamin D, at least 2-3 times per year. She was informed of the risk of over-replacement of vitamin D and agrees to not increase her dose unless he discusses this with [name] first.  Hyperlipidemia Adela was informed of the American Heart Association Guidelines emphasizing intensive lifestyle modifications as the first line treatment for hyperlipidemia. We discussed many lifestyle modifications today in depth, and Averey will continue to work on decreasing saturated fats such as fatty red meat, butter and many fried foods. She will also  increase vegetables and lean protein in her diet and continue to work on exercise and weight loss efforts. We will check labs and Yuleidy will follow up at the agreed upon time.  Diabetes II Jalexis has been given extensive diabetes  education by myself today including ideal fasting and post-prandial blood glucose readings, individual ideal Hgb A1c goals and hypoglycemia prevention. We discussed the importance of good blood sugar control to decrease the likelihood of diabetic complications such as nephropathy, neuropathy, limb loss, blindness, coronary artery disease, and death. We discussed the importance of intensive lifestyle modification including diet, exercise and weight loss as the first line treatment for diabetes. Mozell agrees to continue with diet, exercise and weight loss. We will check labs and Valeta agrees to continue her diabetes medications and will follow up at the agreed upon time.  Cardiovascular risk counseling Luvia was given extended (15 minutes) coronary artery disease prevention counseling today. She is 36 y.o. female and has risk factors for heart disease including obesity, hyperlipidemia and diabetes. We discussed intensive lifestyle modifications today with an emphasis on specific weight loss instructions and strategies. Pt was also informed of the importance of increasing exercise and decreasing saturated fats to help prevent heart disease.  Hypothyroid Vesper was informed of the importance of good thyroid control to help with weight loss efforts. She was also informed that supertheraputic thyroid levels are dangerous and will not improve weight loss results. We will check labs and Kyna agrees to follow up with our clinic in 2 weeks.  Obesity Denae is currently in the action stage of change. As such, her goal is to continue with weight loss efforts She has agreed to keep a food journal with 1400 to 1700 calories and 90+ grams of protein daily Chrystle has been  instructed to work up to a goal of 150 minutes of combined cardio and strengthening exercise per week for weight loss and overall health benefits. We discussed the following Behavioral Modification Strategies today: keep a strict food journal, increasing lean protein intake, decreasing simple carbohydrates, work on meal planning and easy cooking plans and holiday eating strategies   Aiko has agreed to follow up with our clinic in 2 weeks. She was informed of the importance of frequent follow up visits to maximize her success with intensive lifestyle modifications for her multiple health conditions.  I, Doreene Nest, am acting as transcriptionist for Dennard Nip, MD  I have reviewed the above documentation for accuracy and completeness, and I agree with the above. -Dennard Nip, MD    OBESITY BEHAVIORAL INTERVENTION VISIT  Today's visit was # 7 out of 22.  Starting weight: 260 lbs Starting date: 02/09/17 Today's weight : 245 lbs Today's date: 05/12/2017 Total lbs lost to date: 15 (Patients must lose 7 lbs in the first 6 months to continue with counseling)   ASK: We discussed the diagnosis of obesity with Lorenza Cambridge today and Davon agreed to give [name] permission to discuss obesity behavioral modification therapy today.  ASSESS: Casondra has the diagnosis of obesity and her BMI today is 39.56 Emanii is in the action stage of change   ADVISE: Sharae was educated on the multiple health risks of obesity as well as the benefit of weight loss to improve her health. She was advised of the need for long term treatment and the importance of lifestyle modifications.  AGREE: Multiple dietary modification options and treatment options were discussed and  Cariah agreed to keep a food journal with 1400 to 1700 calories and 90+ grams of protein daily We discussed the following Behavioral Modification Strategies today: keep a strict food journal, increasing lean protein intake,  decreasing simple carbohydrates, work on meal planning and easy cooking plans and holiday eating strategies

## 2017-05-13 LAB — CBC WITH DIFFERENTIAL
BASOS ABS: 0.1 10*3/uL (ref 0.0–0.2)
Basos: 1 %
EOS (ABSOLUTE): 0.1 10*3/uL (ref 0.0–0.4)
Eos: 2 %
HEMOGLOBIN: 15.1 g/dL (ref 11.1–15.9)
Hematocrit: 44.1 % (ref 34.0–46.6)
IMMATURE GRANS (ABS): 0 10*3/uL (ref 0.0–0.1)
Immature Granulocytes: 0 %
LYMPHS: 23 %
Lymphocytes Absolute: 2.2 10*3/uL (ref 0.7–3.1)
MCH: 28.8 pg (ref 26.6–33.0)
MCHC: 34.2 g/dL (ref 31.5–35.7)
MCV: 84 fL (ref 79–97)
MONOCYTES: 6 %
Monocytes Absolute: 0.5 10*3/uL (ref 0.1–0.9)
NEUTROS ABS: 6.5 10*3/uL (ref 1.4–7.0)
Neutrophils: 68 %
RBC: 5.24 x10E6/uL (ref 3.77–5.28)
RDW: 14.4 % (ref 12.3–15.4)
WBC: 9.5 10*3/uL (ref 3.4–10.8)

## 2017-05-13 LAB — VITAMIN D 25 HYDROXY (VIT D DEFICIENCY, FRACTURES): VIT D 25 HYDROXY: 35.5 ng/mL (ref 30.0–100.0)

## 2017-05-13 LAB — HEMOGLOBIN A1C
Est. average glucose Bld gHb Est-mCnc: 111 mg/dL
Hgb A1c MFr Bld: 5.5 % (ref 4.8–5.6)

## 2017-05-13 LAB — COMPREHENSIVE METABOLIC PANEL
ALBUMIN: 4.3 g/dL (ref 3.5–5.5)
ALT: 40 IU/L — ABNORMAL HIGH (ref 0–32)
AST: 20 IU/L (ref 0–40)
Albumin/Globulin Ratio: 1.5 (ref 1.2–2.2)
Alkaline Phosphatase: 86 IU/L (ref 39–117)
BILIRUBIN TOTAL: 0.4 mg/dL (ref 0.0–1.2)
BUN / CREAT RATIO: 21 (ref 9–23)
BUN: 15 mg/dL (ref 6–20)
CALCIUM: 9.9 mg/dL (ref 8.7–10.2)
CO2: 19 mmol/L — AB (ref 20–29)
CREATININE: 0.72 mg/dL (ref 0.57–1.00)
Chloride: 103 mmol/L (ref 96–106)
GFR, EST AFRICAN AMERICAN: 124 mL/min/{1.73_m2} (ref >59)
GFR, EST NON AFRICAN AMERICAN: 107 mL/min/{1.73_m2} (ref >59)
GLUCOSE: 84 mg/dL (ref 65–99)
Globulin, Total: 2.8 g/dL (ref 1.5–4.5)
Potassium: 4.3 mmol/L (ref 3.5–5.2)
Sodium: 139 mmol/L (ref 134–144)
TOTAL PROTEIN: 7.1 g/dL (ref 6.0–8.5)

## 2017-05-13 LAB — LIPID PANEL WITH LDL/HDL RATIO
CHOLESTEROL TOTAL: 170 mg/dL (ref 100–199)
HDL: 32 mg/dL — AB (ref >39)
LDL Calculated: 115 mg/dL — ABNORMAL HIGH (ref 0–99)
LDl/HDL Ratio: 3.6 ratio — ABNORMAL HIGH (ref 0.0–3.2)
TRIGLYCERIDES: 116 mg/dL (ref 0–149)
VLDL CHOLESTEROL CAL: 23 mg/dL (ref 5–40)

## 2017-05-13 LAB — VITAMIN B12: VITAMIN B 12: 320 pg/mL (ref 232–1245)

## 2017-05-13 LAB — T3: T3, Total: 125 ng/dL (ref 71–180)

## 2017-05-13 LAB — FOLATE: Folate: 12.5 ng/mL (ref >3.0)

## 2017-05-13 LAB — INSULIN, RANDOM: INSULIN: 39.3 u[IU]/mL — AB (ref 2.6–24.9)

## 2017-05-13 LAB — T4, FREE: Free T4: 1.46 ng/dL (ref 0.82–1.77)

## 2017-05-13 LAB — TSH: TSH: 4.46 u[IU]/mL (ref 0.450–4.500)

## 2017-05-28 ENCOUNTER — Ambulatory Visit (INDEPENDENT_AMBULATORY_CARE_PROVIDER_SITE_OTHER): Payer: 59 | Admitting: Family Medicine

## 2017-05-28 VITALS — BP 106/72 | HR 84 | Temp 97.7°F | Ht 66.0 in | Wt 248.0 lb

## 2017-05-28 DIAGNOSIS — E559 Vitamin D deficiency, unspecified: Secondary | ICD-10-CM | POA: Diagnosis not present

## 2017-05-28 DIAGNOSIS — Z6841 Body Mass Index (BMI) 40.0 and over, adult: Secondary | ICD-10-CM | POA: Diagnosis not present

## 2017-05-28 DIAGNOSIS — E119 Type 2 diabetes mellitus without complications: Secondary | ICD-10-CM

## 2017-05-28 DIAGNOSIS — Z9189 Other specified personal risk factors, not elsewhere classified: Secondary | ICD-10-CM

## 2017-05-28 MED ORDER — LIRAGLUTIDE 18 MG/3ML ~~LOC~~ SOPN: 1.8000 mg | PEN_INJECTOR | Freq: Once | SUBCUTANEOUS | 0 refills | Status: DC

## 2017-05-28 MED ORDER — VITAMIN D (ERGOCALCIFEROL) 1.25 MG (50000 UNIT) PO CAPS: 50000.0000 [IU] | ORAL_CAPSULE | ORAL | 0 refills | Status: DC

## 2017-05-28 MED FILL — VIT D2 1.25 MG (50,000 UNIT: 1.25 MG | 28 days supply | Qty: 4 | Fill #0

## 2017-05-28 NOTE — Progress Notes (Signed)
Office: [phone]  /  Fax: [phone]   HPI:   Chief Complaint: OBESITY Tammy Mooney is here to discuss her progress with her obesity treatment plan. She is on the keep a food journal with 1400-1700 calories and 90+ grams of protein daily and is following her eating plan approximately 40 % of the time. She states she is walking 8,000 steps a day. Tammy Mooney did well with journaling the first week but traveled last week and had to increase eating out. She is mostly retaining fluid today but she is ready to get back on track. She has started tracking her daily steps.Marland Kitchen  Her weight is 248 lb (112.5 kg) today and has gained 3 pounds since her last visit. She has lost 12 lbs since starting treatment with [name].  Diabetes II Tammy Mooney has a diagnosis of diabetes type II. Tammy Mooney A1c has greatly improved and now at 5.5. She still notes polyphagia and denies any hypoglycemic episodes. She has been working on intensive lifestyle modifications including diet, exercise, and weight loss to help control her blood glucose levels.  Vitamin D deficiency Tammy Mooney has a diagnosis of vitamin D deficiency. She is stable on prescription Vit D, but not yet at goal but slowly improving. She denies nausea, vomiting or muscle weakness.  At risk for osteopenia and osteoporosis Tammy Mooney is at higher risk of osteopenia and osteoporosis due to vitamin D deficiency.   ALLERGIES: Allergies  Allergen Reactions  . Adhesive [Tape] Rash and Other (See Comments)    Plastic tape causes rash, paper tape is okay, tegaderm is ok  . Penicillins Rash    MEDICATIONS: Current Outpatient Medications on File Prior to Visit  Medication Sig Dispense Refill  . busPIRone (BUSPAR) 5 MG tablet Take 1 tablet (5 mg total) by mouth 2 (two) times daily as needed. 60 tablet 1  . cetirizine (ZYRTEC) 10 MG tablet Take 10 mg by mouth daily.    . chlorhexidine (PERIDEX) 0.12 % solution Use as directed 15 mLs in the mouth or throat 2 (two) times  daily.    . Insulin Pen Needle 32G X 4 MM MISC 1 Package by Does not apply route every morning. 100 each 0  . levothyroxine (SYNTHROID, LEVOTHROID) 88 MCG tablet Take 1 tablet (88 mcg total) by mouth daily before breakfast. 30 tablet 0  . metFORMIN (GLUCOPHAGE-XR) 750 MG 24 hr tablet TAKE 2 TABLETS BY MOUTH DAILY WITH BREAKFAST. 180 tablet 1  . MIRVASO 0.33 % GEL   2  . Vitamin D, Ergocalciferol, (DRISDOL) 50000 units CAPS capsule Take 1 capsule (50,000 Units total) by mouth every 7 (seven) days. 4 capsule 0  . liraglutide 18 MG/3ML SOPN Inject 0.1 mLs (0.6 mg total) into the skin once. 2 pen 0   No current facility-administered medications on file prior to visit.     PAST MEDICAL HISTORY: Past Medical History:  Diagnosis Date  . Diabetes mellitus without complication ([name])    glyburide  . H/O varicella   . Hypothyroidism   . Infections of genitourinary tract in pregnancy, unspecified as to episode of care(646.60)   . Infertility, female   . PCOS (polycystic ovarian syndrome)     PAST SURGICAL HISTORY: Past Surgical History:  Procedure Laterality Date  . CESAREAN SECTION    . EYE SURGERY     lasic  . MANDIBLE SURGERY    . NASAL SINUS SURGERY    . TONSILLECTOMY      SOCIAL HISTORY: Social History   Tobacco Use  .  Smoking status: Never Smoker  . Smokeless tobacco: Never Used  Substance Use Topics  . Alcohol use: No  . Drug use: No    FAMILY HISTORY: Family History  Problem Relation Age of Onset  . Diabetes Mother   . Depression Mother   . Sleep apnea Mother   . Obesity Mother   . Heart attack Father   . Hypertension Father   . Hyperlipidemia Father   . Heart disease Father   . Heart disease Maternal Grandfather   . Diabetes Paternal Grandmother     ROS: Review of Systems  Constitutional: Negative for weight loss.  Gastrointestinal: Negative for nausea and vomiting.  Musculoskeletal:       Negative muscle weakness  Endo/Heme/Allergies:       Negative  hypoglycemia    PHYSICAL EXAM: Blood pressure 106/72, pulse 84, temperature 97.7 F (36.5 C), temperature source Oral, height 5\' 6"  (1.676 m), weight 248 lb (112.5 kg), SpO2 98 %, unknown if currently breastfeeding. Body mass index is 40.03 kg/m. Physical Exam  Constitutional: She is oriented to person, place, and time. She appears well-developed and well-nourished.  Cardiovascular: Normal rate.  Pulmonary/Chest: Effort normal.  Musculoskeletal: Normal range of motion.  Neurological: She is oriented to person, place, and time.  Skin: Skin is warm and dry.  Psychiatric: She has a normal mood and affect. Her behavior is normal.  Vitals reviewed.   RECENT LABS AND TESTS: BMET    Component Value Date/Time   NA 139 05/12/2017 0805   K 4.3 05/12/2017 0805   CL 103 05/12/2017 0805   CO2 19 (L) 05/12/2017 0805   GLUCOSE 84 05/12/2017 0805   GLUCOSE 104 (H) 07/09/2016 1213   BUN 15 05/12/2017 0805   CREATININE 0.72 05/12/2017 0805   CALCIUM 9.9 05/12/2017 0805   GFRNONAA 107 05/12/2017 0805   GFRAA 124 05/12/2017 0805   Lab Results  Component Value Date   HGBA1C 5.5 05/12/2017   HGBA1C 6.6 (H) 02/09/2017   HGBA1C 6.1 07/09/2016   HGBA1C 5.8 01/03/2016   HGBA1C 6.3 (H) 10/26/2014   Lab Results  Component Value Date   INSULIN 39.3 (H) 05/12/2017   INSULIN 42.1 (H) 02/09/2017   CBC    Component Value Date/Time   WBC 9.5 05/12/2017 0805   WBC 10.6 (H) 10/26/2014 0545   RBC 5.24 05/12/2017 0805   RBC 4.47 10/26/2014 0545   HGB 15.1 05/12/2017 0805   HCT 44.1 05/12/2017 0805   PLT 394 (H) 02/09/2017 1053   MCV 84 05/12/2017 0805   MCH 28.8 05/12/2017 0805   MCH 26.8 10/26/2014 0545   MCHC 34.2 05/12/2017 0805   MCHC 33.7 10/26/2014 0545   RDW 14.4 05/12/2017 0805   LYMPHSABS 2.2 05/12/2017 0805   MONOABS 0.5 05/20/2013 0925   EOSABS 0.1 05/12/2017 0805   BASOSABS 0.1 05/12/2017 0805   Iron/TIBC/Ferritin/ %Sat No results found for: IRON, TIBC, FERRITIN,  IRONPCTSAT Lipid Panel     Component Value Date/Time   CHOL 170 05/12/2017 0805   TRIG 116 05/12/2017 0805   HDL 32 (L) 05/12/2017 0805   CHOLHDL 5 05/20/2013 0925   VLDL 23.4 05/20/2013 0925   LDLCALC 115 (H) 05/12/2017 0805   Hepatic Function Panel     Component Value Date/Time   PROT 7.1 05/12/2017 0805   ALBUMIN 4.3 05/12/2017 0805   AST 20 05/12/2017 0805   ALT 40 (H) 05/12/2017 0805   ALKPHOS 86 05/12/2017 0805   BILITOT 0.4 05/12/2017 0805  Component Value Date/Time   TSH 4.460 05/12/2017 0805   TSH 5.250 (H) 02/09/2017 1053   TSH 4.08 07/09/2016 1213    ASSESSMENT AND PLAN: Type 2 diabetes mellitus without complication, without long-term current use of insulin ([place]) - Plan: liraglutide 18 MG/3ML SOPN  Vitamin D deficiency - Plan: Vitamin D, Ergocalciferol, (DRISDOL) 50000 units CAPS capsule  At risk for osteoporosis  Class 3 severe obesity with serious comorbidity and body mass index (BMI) of 40.0 to 44.9 in adult, unspecified obesity type ([place])  PLAN:  Diabetes II Tammy Mooney has been given extensive diabetes education by myself today including ideal fasting and post-prandial blood glucose readings, individual ideal Hgb A1c goals and hypoglycemia prevention. We discussed the importance of good blood sugar control to decrease the likelihood of diabetic complications such as nephropathy, neuropathy, limb loss, blindness, coronary artery disease, and death. We discussed the importance of intensive lifestyle modification including diet, exercise and weight loss as the first line treatment for diabetes. Tammy Mooney agrees to increase Victoza to 1.5 mg qd (refill at 1.8 mg #3 pens). Tammy Mooney agrees to follow up with our clinic in 3 weeks.  Vitamin D Deficiency Tammy Mooney was informed that low vitamin D levels contributes to fatigue and are associated with obesity, breast, and colon cancer. Tammy Mooney agrees to continue taking prescription Vit D @50 ,000 IU every week #4 and we will  refill for 1 month. She will follow up for routine testing of vitamin D, at least 2-3 times per year. She was informed of the risk of over-replacement of vitamin D and agrees to not increase her dose unless he discusses this with [name] first. Tammy Mooney agrees to follow up with our clinic in 3 weeks.  At risk for osteopenia and osteoporosis Tammy Mooney is at risk for osteopenia and osteoporsis due to her vitamin D deficiency. She was encouraged to take her vitamin D and follow her higher calcium diet and increase strengthening exercise to help strengthen her bones and decrease her risk of osteopenia and osteoporosis.  Obesity Tammy Mooney is currently in the action stage of change. As such, her goal is to continue with weight loss efforts She has agreed to keep a food journal with 1400-1700 calories and 90+ grams of protein daily Tammy Mooney has been instructed to work up to a goal of 150 minutes of combined cardio and strengthening exercise per week or walking 8,000 steps daily for weight loss and overall health benefits. We discussed the following Behavioral Modification Strategies today: increasing lean protein intake, holiday eating strategies, and travel eating strategies   Tammy Mooney has agreed to follow up with our clinic in 3 weeks. She was informed of the importance of frequent follow up visits to maximize her success with intensive lifestyle modifications for her multiple health conditions.  I, Trixie Dredge, am acting as transcriptionist for Dennard Nip, MD  I have reviewed the above documentation for accuracy and completeness, and I agree with the above. -Dennard Nip, MD      Today's visit was # 8 out of 22.  Starting weight: 260 lbs Starting date: 02/09/17 Today's weight : 248 lbs  Today's date: 05/28/2017 Total lbs lost to date: 12 (Patients must lose 7 lbs in the first 6 months to continue with counseling)   ASK: We discussed the diagnosis of obesity with Tammy Mooney today and  Tammy Mooney agreed to give [name] permission to discuss obesity behavioral modification therapy today.  ASSESS: Tammy Mooney has the diagnosis of obesity and her BMI today is 40.05 Tammy Mooney is  in the action stage of change   ADVISE: Tammy Mooney was educated on the multiple health risks of obesity as well as the benefit of weight loss to improve her health. She was advised of the need for long term treatment and the importance of lifestyle modifications.  AGREE: Multiple dietary modification options and treatment options were discussed and  Tammy Mooney agreed to keep a food journal with 1400-1700 calories and 90+ grams of protein daily We discussed the following Behavioral Modification Strategies today: increasing lean protein intake, holiday eating strategies, and travel eating strategies

## 2017-06-01 MED FILL — METFORMIN HCL ER 750 MG TAB: 750 | 90 days supply | Qty: 180 | Fill #1

## 2017-06-10 MED FILL — VICTOZA 18 MG/3 ML INJECT P: 18 | 30 days supply | Qty: 9 | Fill #0

## 2017-06-18 ENCOUNTER — Ambulatory Visit (INDEPENDENT_AMBULATORY_CARE_PROVIDER_SITE_OTHER): Payer: 59 | Admitting: Family Medicine

## 2017-06-18 VITALS — BP 114/79 | HR 78 | Temp 97.8°F | Ht 66.0 in | Wt 246.0 lb

## 2017-06-18 DIAGNOSIS — Z9189 Other specified personal risk factors, not elsewhere classified: Secondary | ICD-10-CM | POA: Diagnosis not present

## 2017-06-18 DIAGNOSIS — E559 Vitamin D deficiency, unspecified: Secondary | ICD-10-CM

## 2017-06-18 DIAGNOSIS — Z6839 Body mass index (BMI) 39.0-39.9, adult: Secondary | ICD-10-CM

## 2017-06-18 DIAGNOSIS — E119 Type 2 diabetes mellitus without complications: Secondary | ICD-10-CM

## 2017-06-18 MED ORDER — LIRAGLUTIDE 18 MG/3ML ~~LOC~~ SOPN: 1.8000 mg | PEN_INJECTOR | Freq: Every day | SUBCUTANEOUS | 0 refills | Status: DC

## 2017-06-18 MED ORDER — VITAMIN D (ERGOCALCIFEROL) 1.25 MG (50000 UNIT) PO CAPS
50000.0000 [IU] | ORAL_CAPSULE | ORAL | 0 refills | Status: DC
Start: 2017-06-18 — End: 2017-07-08

## 2017-06-18 NOTE — Progress Notes (Signed)
Office: [phone]  /  Fax: [phone]   HPI:   Chief Complaint: OBESITY Tammy Mooney is here to discuss her progress with her obesity treatment plan. She is on the keep a food journal with 1400-1700 calories and 90+ grams of protein daily and is following her eating plan approximately 75 % of the time. She states she is exercising 0 minutes 0 times per week. Tammy Mooney has done well with weight loss even over Thanksgiving. She is journaling most days and working on increasing protein and vegetables.  Her weight is 246 lb (111.6 kg) today and has had a weight loss of 2 pounds over a period of 3 weeks since her last visit. She has lost 14 lbs since starting treatment with [name].  Diabetes II Tammy Mooney has a diagnosis of diabetes type II. Tammy Mooney is on Victoza and metformin and doing well decreasing polyphagia. She denies any hypoglycemic episodes, nausea, or vomiting. Last A1c was 5.5 on 05/12/17. She has been working on intensive lifestyle modifications including diet, exercise, and weight loss to help control her blood glucose levels.  At risk for cardiovascular disease Tammy Mooney is at a higher than average risk for cardiovascular disease due to obesity and diabetes II. She currently denies any chest pain.  Vitamin D deficiency Tammy Mooney has a diagnosis of vitamin D deficiency. She is stable on prescription Vit D and denies nausea, vomiting or muscle weakness.  ALLERGIES: Allergies  Allergen Reactions  . Adhesive [Tape] Rash and Other (See Comments)    Plastic tape causes rash, paper tape is okay, tegaderm is ok  . Penicillins Rash    MEDICATIONS: Current Outpatient Medications on File Prior to Visit  Medication Sig Dispense Refill  . busPIRone (BUSPAR) 5 MG tablet Take 1 tablet (5 mg total) by mouth 2 (two) times daily as needed. 60 tablet 1  . cetirizine (ZYRTEC) 10 MG tablet Take 10 mg by mouth daily.    . chlorhexidine (PERIDEX) 0.12 % solution Use as directed 15 mLs in the mouth or  throat 2 (two) times daily.    . Insulin Pen Needle 32G X 4 MM MISC 1 Package by Does not apply route every morning. 100 each 0  . levothyroxine (SYNTHROID, LEVOTHROID) 88 MCG tablet Take 1 tablet (88 mcg total) by mouth daily before breakfast. 30 tablet 0  . metFORMIN (GLUCOPHAGE-XR) 750 MG 24 hr tablet TAKE 2 TABLETS BY MOUTH DAILY WITH BREAKFAST. 180 tablet 1  . MIRVASO 0.33 % GEL   2  . Vitamin D, Ergocalciferol, (DRISDOL) 50000 units CAPS capsule Take 1 capsule (50,000 Units total) every 7 (seven) days by mouth. 4 capsule 0  . liraglutide 18 MG/3ML SOPN Inject 0.3 mLs (1.8 mg total) once for 1 dose into the skin. 3 pen 0   No current facility-administered medications on file prior to visit.     PAST MEDICAL HISTORY: Past Medical History:  Diagnosis Date  . Diabetes mellitus without complication ([name])    glyburide  . H/O varicella   . Hypothyroidism   . Infections of genitourinary tract in pregnancy, unspecified as to episode of care(646.60)   . Infertility, female   . PCOS (polycystic ovarian syndrome)     PAST SURGICAL HISTORY: Past Surgical History:  Procedure Laterality Date  . CESAREAN SECTION    . CESAREAN SECTION  07/01/2012   Procedure: CESAREAN SECTION;  Surgeon: Lovenia Kim, MD;  Location: [facility];  Service: Obstetrics;  Laterality: N/A;  Repeat cesarean section with delivery of baby girl at  1738. Apgars 9/9.  Marland Kitchen CESAREAN SECTION N/A 10/25/2014   Procedure: Repeat CESAREAN SECTION;  Surgeon: Brien Few, MD;  Location: [facility];  Service: Obstetrics;  Laterality: N/A;  EDD: 10/31/14  . EYE SURGERY     lasic  . HARDWARE REMOVAL Left 06/05/2016   Procedure: HARDWARE REMOVAL;  Surgeon: Margaretha Sheffield, MD;  Location: [facility];  Service: ENT;  Laterality: Left;  HARDWARE REMOVED FROM LEFT MAXILLA  . IMAGE GUIDED SINUS SURGERY N/A 06/05/2016   Procedure: IMAGE GUIDED SINUS SURGERY;  Surgeon: Margaretha Sheffield, MD;  Location: [facility];  Service: ENT;   Laterality: N/A;  Gave disk to cece on 11-15 kp  . MANDIBLE SURGERY    . MAXILLARY ANTROSTOMY Right 06/05/2016   Procedure: MAXILLARY ANTROSTOMY;  Surgeon: Margaretha Sheffield, MD;  Location: [facility];  Service: ENT;  Laterality: Right;  . NASAL SINUS SURGERY    . SINUSOTOMY Left 06/05/2016   Procedure: SINUSOTOMY VIA CALDWELL LUC;  Surgeon: Margaretha Sheffield, MD;  Location: [facility];  Service: ENT;  Laterality: Left;  Diabetic -- oral meds  . TONSILLECTOMY    . TURBINATE REDUCTION Bilateral 06/05/2016   Procedure: PARTIAL TURBINATE REDUCTION;  Surgeon: Margaretha Sheffield, MD;  Location: [facility];  Service: ENT;  Laterality: Bilateral;    SOCIAL HISTORY: Social History   Tobacco Use  . Smoking status: Never Smoker  . Smokeless tobacco: Never Used  Substance Use Topics  . Alcohol use: No  . Drug use: No    FAMILY HISTORY: Family History  Problem Relation Age of Onset  . Diabetes Mother   . Depression Mother   . Sleep apnea Mother   . Obesity Mother   . Heart attack Father   . Hypertension Father   . Hyperlipidemia Father   . Heart disease Father   . Heart disease Maternal Grandfather   . Diabetes Paternal Grandmother     ROS: Review of Systems  Constitutional: Positive for weight loss.  Cardiovascular: Negative for chest pain.  Gastrointestinal: Negative for nausea and vomiting.  Musculoskeletal:       Negative muscle weakness  Endo/Heme/Allergies:       Negative hypoglycemia Positive polyphagia    PHYSICAL EXAM: Blood pressure 114/79, pulse 78, temperature 97.8 F (36.6 C), temperature source Oral, height 5\' 6"  (1.676 m), weight 246 lb (111.6 kg), SpO2 97 %, unknown if currently breastfeeding. Body mass index is 39.71 kg/m. Physical Exam  Constitutional: She is oriented to person, place, and time. She appears well-developed and well-nourished.  Cardiovascular: Normal rate.  Pulmonary/Chest: Effort normal.  Musculoskeletal: Normal range of  motion.  Neurological: She is oriented to person, place, and time.  Skin: Skin is warm and dry.  Psychiatric: She has a normal mood and affect. Her behavior is normal.  Vitals reviewed.   RECENT LABS AND TESTS: BMET    Component Value Date/Time   NA 139 05/12/2017 0805   K 4.3 05/12/2017 0805   CL 103 05/12/2017 0805   CO2 19 (L) 05/12/2017 0805   GLUCOSE 84 05/12/2017 0805   GLUCOSE 104 (H) 07/09/2016 1213   BUN 15 05/12/2017 0805   CREATININE 0.72 05/12/2017 0805   CALCIUM 9.9 05/12/2017 0805   GFRNONAA 107 05/12/2017 0805   GFRAA 124 05/12/2017 0805   Lab Results  Component Value Date   HGBA1C 5.5 05/12/2017   HGBA1C 6.6 (H) 02/09/2017   HGBA1C 6.1 07/09/2016   HGBA1C 5.8 01/03/2016   HGBA1C 6.3 (H) 10/26/2014  Lab Results  Component Value Date   INSULIN 39.3 (H) 05/12/2017   INSULIN 42.1 (H) 02/09/2017   CBC    Component Value Date/Time   WBC 9.5 05/12/2017 0805   WBC 10.6 (H) 10/26/2014 0545   RBC 5.24 05/12/2017 0805   RBC 4.47 10/26/2014 0545   HGB 15.1 05/12/2017 0805   HCT 44.1 05/12/2017 0805   PLT 394 (H) 02/09/2017 1053   MCV 84 05/12/2017 0805   MCH 28.8 05/12/2017 0805   MCH 26.8 10/26/2014 0545   MCHC 34.2 05/12/2017 0805   MCHC 33.7 10/26/2014 0545   RDW 14.4 05/12/2017 0805   LYMPHSABS 2.2 05/12/2017 0805   MONOABS 0.5 05/20/2013 0925   EOSABS 0.1 05/12/2017 0805   BASOSABS 0.1 05/12/2017 0805   Iron/TIBC/Ferritin/ %Sat No results found for: IRON, TIBC, FERRITIN, IRONPCTSAT Lipid Panel     Component Value Date/Time   CHOL 170 05/12/2017 0805   TRIG 116 05/12/2017 0805   HDL 32 (L) 05/12/2017 0805   CHOLHDL 5 05/20/2013 0925   VLDL 23.4 05/20/2013 0925   LDLCALC 115 (H) 05/12/2017 0805   Hepatic Function Panel     Component Value Date/Time   PROT 7.1 05/12/2017 0805   ALBUMIN 4.3 05/12/2017 0805   AST 20 05/12/2017 0805   ALT 40 (H) 05/12/2017 0805   ALKPHOS 86 05/12/2017 0805   BILITOT 0.4 05/12/2017 0805      Component  Value Date/Time   TSH 4.460 05/12/2017 0805   TSH 5.250 (H) 02/09/2017 1053   TSH 4.08 07/09/2016 1213    ASSESSMENT AND PLAN: Class 2 severe obesity with serious comorbidity and body mass index (BMI) of 39.0 to 39.9 in adult, unspecified obesity type ([place])  Type 2 diabetes mellitus without complication, without long-term current use of insulin ([place]) - Plan: liraglutide 18 MG/3ML SOPN  Vitamin D deficiency - Plan: Vitamin D, Ergocalciferol, (DRISDOL) 50000 units CAPS capsule  At risk for heart disease  PLAN:  Diabetes II Tammy Mooney has been given extensive diabetes education by myself today including ideal fasting and post-prandial blood glucose readings, individual ideal Hgb A1c goals and hypoglycemia prevention. We discussed the importance of good blood sugar control to decrease the likelihood of diabetic complications such as nephropathy, neuropathy, limb loss, blindness, coronary artery disease, and death. We discussed the importance of intensive lifestyle modification including diet, exercise and weight loss as the first line treatment for diabetes. Tammy Mooney agrees to continue Victoza 1.8 mg qd 3 pens and we will refill for 1 month. Tammy Mooney agrees to follow up with our clinic in 3 weeks.  Cardiovascular risk counselling Tammy Mooney was given extended (15 minutes) coronary artery disease prevention counseling today. She is 36 y.o. female and has risk factors for heart disease including obesity and diabetes II. We discussed intensive lifestyle modifications today with an emphasis on specific weight loss instructions and strategies. Pt was also informed of the importance of increasing exercise and decreasing saturated fats to help prevent heart disease.  Vitamin D Deficiency Tammy Mooney was informed that low vitamin D levels contributes to fatigue and are associated with obesity, breast, and colon cancer. Tammy Mooney agrees to continue taking prescription Vit D @50 ,000 IU every week #4 and we will refill  for 1 month. She will follow up for routine testing of vitamin D, at least 2-3 times per year. She was informed of the risk of over-replacement of vitamin D and agrees to not increase her dose unless he discusses this with [name] first. Tammy Mooney agrees to  follow up with our clinic in 3 weeks.  Obesity Tammy Mooney is currently in the action stage of change. As such, her goal is to continue with weight loss efforts She has agreed to keep a food journal with 1400-1700 calories and 90+ grams of protein daily Tammy Mooney has been instructed to work up to a goal of 150 minutes of combined cardio and strengthening exercise per week or start walking for 10-15 daily for weight loss and overall health benefits. We discussed the following Behavioral Modification Strategies today: dealing with family or coworker sabotage and holiday eating strategies and better snacking choices   Tammy Mooney has agreed to follow up with our clinic in 3 weeks. She was informed of the importance of frequent follow up visits to maximize her success with intensive lifestyle modifications for her multiple health conditions.  I, Trixie Dredge, am acting as transcriptionist for Dennard Nip, MD  I have reviewed the above documentation for accuracy and completeness, and I agree with the above. -Dennard Nip, MD     Today's visit was # 9 out of 22.  Starting weight: 260 lbs Starting date: 02/09/17 Today's weight : 246 lbs  Today's date: 06/18/2017 Total lbs lost to date: 14 (Patients must lose 7 lbs in the first 6 months to continue with counseling)   ASK: We discussed the diagnosis of obesity with Tammy Mooney today and Tammy Mooney agreed to give [name] permission to discuss obesity behavioral modification therapy today.  ASSESS: Tammy Mooney has the diagnosis of obesity and her BMI today is 39.72 Tammy Mooney is in the action stage of change   ADVISE: Tammy Mooney was educated on the multiple health risks of obesity as well as the benefit of  weight loss to improve her health. She was advised of the need for long term treatment and the importance of lifestyle modifications.  AGREE: Multiple dietary modification options and treatment options were discussed and  Tammy Mooney agreed to keep a food journal with 1400-1700 calories and 90+ grams of protein daily We discussed the following Behavioral Modification Strategies today: dealing with family or coworker sabotage and holiday eating strategies and better snacking choices

## 2017-06-24 ENCOUNTER — Encounter: Payer: Self-pay | Admitting: Family Medicine

## 2017-06-24 ENCOUNTER — Ambulatory Visit (INDEPENDENT_AMBULATORY_CARE_PROVIDER_SITE_OTHER): Payer: 59 | Admitting: Family Medicine

## 2017-06-24 DIAGNOSIS — M25561 Pain in right knee: Secondary | ICD-10-CM | POA: Diagnosis not present

## 2017-06-24 NOTE — Progress Notes (Signed)
Chief complaint: Right knee pain x 1 day status post fall  History of present illness: Tammy Mooney 36 year old female who presents to the sports medicine office today with chief complaint of right knee pain. She points to the medial aspect of her right knee as point of maximal tenderness. She reports that while walking to pick up her children last night, she slipped in a muddy area in the parking lot. She reports having a hyperflexion of her right knee and a valgus stress applied to her right knee. She reports immediately feeling pain along the medial joint line of her right knee. She reports extreme amount of pain with bearing weight in the right knee. She does not report of any immediate swelling or bruising. She hasn't been able to put any weight on it to notice if there has been any type of popping, locking, catching, or symptoms of giving way. She did take some Aleve last night. She does not report of any numbness, tingling, or burning paresthesias. She describes the pain today as a sharp pain, nonradiating. She rates the pain as a 4/10. She reports rest is the is only alleviating factor. She does not report of any previous injury to her right knee.  Review of systems:  As stated above  Her past medical history, surgical history, family history, and social history obtained and reviewed. Previous surgery on right knee, no current tobacco use, no history of diabetes, family history notable for diabetes.  Physical exam: Vital signs are reviewed and are documented in the chart Gen.: Alert, oriented, appears stated age, in no apparent distress HEENT: Moist oral mucosa Respiratory: Normal respirations, able to speak in full sentences Cardiac: Regular rate, distal pulses 2+ Integumentary: No rashes on visible skin:  Neurologic: Due to pain in her right knee, strength is limited with right knee flexion or extension, with categorizes as 4+/5, otherwise strength 5/5, sensation 2+ in bilateral lower  extremities Psych: Normal affect, mood is described as good Musculoskeletal: Inspection of her right knee reveals no obvious deformity or muscle atrophy, she does have slight effusion noted in the right knee more on medial side, no warmth, erythema, ecchymosis noted, she is exquisitely tender to light palpation over the medial joint line, no tenderness over quadriceps tendon, patellar tendon, lateral joint line, no signs of ligamentous instability as Lachman, anterior drawer, valgus, and varus stress testing negative, though valgus stress testing does reproduce pain, McMurray positive for pain, negative for crepitus, he does have limited range of motion and her right knee today secondary to pain, range of motion is from 0 to 100, she has full range of motion in her left knee  Limited musculoskeletal ultrasound was performed in the office today of her right knee. Key findings of the exam revealed that she does have slight hypoechogenicity noted in the MCL consistent with a sprain, but no evidence of any frank tearing. I do see some slight hypoechogenicity noted coming from the medial joint line in the anterior aspect of the medial meniscus, raising concern for meniscal pathology. She does have normal quadriceps tendon and patellar tendon.  Assessment and plan: 1. Acute right knee pain s/p fall last night, with ultrasound evidence of MCL sprain, concern for medial meniscal tear  Plan: Given that she does have a lot of pain with weightbearing, has slight effusion, an exquisite amount of pain with McMurray, I am concerned about a potential meniscal pathology. I would like to order an MRI of her right knee to ensure she does  not have a large medial meniscal tear that require any type of surgical intervention. I discussed that ultrasound does show findings of MCL sprain and this could very well be the sole culprit of all the pain that she is having. I discussed having her in a hinge knee brace for support,  discussed using Aleve as needed for pain, cryotherapy, rest, avoiding aggravating factors such as pivoting, shifting, and cutting. I will call her after MRI has been done, with results dictating next steps and plan of care.   Mort Sawyers, M.D. [facility]

## 2017-06-25 ENCOUNTER — Ambulatory Visit (HOSPITAL_COMMUNITY)
Admission: RE | Admit: 2017-06-25 | Discharge: 2017-06-25 | Disposition: A | Discharge status: Home / Self Care | Payer: 59 | Source: Ambulatory Visit | Attending: Sports Medicine | Admitting: Sports Medicine

## 2017-06-25 DIAGNOSIS — R9389 Abnormal findings on diagnostic imaging of other specified body structures: Secondary | ICD-10-CM | POA: Insufficient documentation

## 2017-06-25 DIAGNOSIS — M7989 Other specified soft tissue disorders: Secondary | ICD-10-CM | POA: Diagnosis not present

## 2017-06-25 DIAGNOSIS — M25561 Pain in right knee: Secondary | ICD-10-CM | POA: Diagnosis present

## 2017-06-25 DIAGNOSIS — R600 Localized edema: Secondary | ICD-10-CM | POA: Diagnosis not present

## 2017-06-26 ENCOUNTER — Telehealth: Payer: Self-pay | Admitting: Family Medicine

## 2017-06-26 NOTE — Telephone Encounter (Signed)
Called Tammy Mooney this morning at 0845 to let her know of her MRI result of her right knee. Fortunately, it only showed a sprain of the MCL and the MPFL. No meniscal tearing. Discussed knee compressive sleeve, cryotherapy, relative rest, elevation when possible, and Aleve. Discussed to return to office if symptoms are not getting any better in the next few weeks. She does understand and verbalize plan.  Mort Sawyers, MD Primary Care Sports Medicine Fellow [facility]

## 2017-06-27 ENCOUNTER — Ambulatory Visit (HOSPITAL_COMMUNITY): Payer: 59

## 2017-07-02 ENCOUNTER — Ambulatory Visit (INDEPENDENT_AMBULATORY_CARE_PROVIDER_SITE_OTHER): Payer: 59 | Admitting: Dietician

## 2017-07-02 VITALS — Ht 66.0 in | Wt 242.0 lb

## 2017-07-02 DIAGNOSIS — E119 Type 2 diabetes mellitus without complications: Secondary | ICD-10-CM | POA: Diagnosis not present

## 2017-07-02 DIAGNOSIS — Z6839 Body mass index (BMI) 39.0-39.9, adult: Secondary | ICD-10-CM | POA: Diagnosis not present

## 2017-07-02 DIAGNOSIS — Z9189 Other specified personal risk factors, not elsewhere classified: Secondary | ICD-10-CM | POA: Diagnosis not present

## 2017-07-02 NOTE — Progress Notes (Signed)
  Office: [phone]  /  Fax: [phone]     Tammy Mooney has a diagnosis of diabetes type II which puts her at higher than average risk for cardiovascular disease. She is here today for CVD risk nutrition counseling which includes diabetes and obesity management education. Tammy Mooney states BGs consistently in an acceptable range and denies any hypoglycemic episodes. Last A1c was Hemoglobin A1C Latest Ref Rng & Units 05/12/2017 02/09/2017 07/09/2016  HGBA1C 4.8 - 5.6 % 5.5 6.6(H) 6.1  Some recent data might be hidden     She has been working on intensive lifestyle modifications including diet, exercise, and weight loss to help control her blood glucose levels. She is journaling her food intake with her goals of 1400-1700 calories and 90+ grams of protein. She states she has journalled about 50% of the time over the past 2 weeks. She reports she fell and twisted her knee and she got off track for about a week. She states her emotional eating increased over the past week however states she is more aware of the occurrences and has noticed somewhat better control.   Tammy Mooney's weight is 242 lbs today a 4 lb weight loss since her last visit and  An 18 lb weight loss since beginning treatment with [name].   Patient was educated about food nutrients ie protein, fats, simple and complex carbohydrates and how these affect insulin response. Focus on portion control,  avoiding simple carbohydrates and increased lean protein for ongoing wt loss efforts and glucose management  Tammy Mooney is on the following meal plan: journaling 1400-1700 calories and 90+ grams of protein.  Her meal plan was individualized for maximum benefit.  Also discussed at length the following behavioral modifications to help maximize success: increasing lean protein intake, decreasing simple carbohydrates, increasing vegetables, emotional eating strategies,  holiday eating strategies,  keeping a strict food journal.    Tammy Mooney has been  instructed to work up to a goal of 150 minutes of combined cardio and strengthening exercise per week for weight loss and overall health benefits.   Office: [phone]  /  Fax: [phone]  OBESITY BEHAVIORAL INTERVENTION VISIT  Today's visit was # 10 out of 22.  Starting weight: 260 lbs Starting date: 02/09/17 Today's weight : Weight: 242 lb (109.8 kg)  Today's date: 07/02/2017 Total lbs lost to date: 28 (Patients must lose 7 lbs in the first 6 months to continue with counseling)   ASK: We discussed the diagnosis of obesity with Tammy Mooney today and Tammy Mooney agreed to give [name] permission to discuss obesity behavioral modification therapy today.  ASSESS: Tammy Mooney has the diagnosis of obesity and her BMI today is 29.08 Tammy Mooney is in the action stage of change   ADVISE: Tammy Mooney was educated on the multiple health risks of obesity as well as the benefit of weight loss to improve her health. She was advised of the need for long term treatment and the importance of lifestyle modifications.  AGREE: Multiple dietary modification options and treatment options were discussed and  Tammy Mooney agreed to keep a food journal with 1400-1700  calories and 90+ protein  We discussed the following Behavioral Modification Stratagies today: increasing lean protein intake, decreasing simple carbohydrates  and holiday eating strategies

## 2017-07-03 ENCOUNTER — Other Ambulatory Visit: Payer: 59

## 2017-07-08 ENCOUNTER — Ambulatory Visit (INDEPENDENT_AMBULATORY_CARE_PROVIDER_SITE_OTHER): Payer: 59 | Admitting: Family Medicine

## 2017-07-08 VITALS — BP 109/76 | HR 90 | Temp 97.7°F | Ht 66.0 in | Wt 242.0 lb

## 2017-07-08 DIAGNOSIS — E559 Vitamin D deficiency, unspecified: Secondary | ICD-10-CM

## 2017-07-08 DIAGNOSIS — Z6839 Body mass index (BMI) 39.0-39.9, adult: Secondary | ICD-10-CM

## 2017-07-08 DIAGNOSIS — E119 Type 2 diabetes mellitus without complications: Secondary | ICD-10-CM

## 2017-07-08 DIAGNOSIS — Z9189 Other specified personal risk factors, not elsewhere classified: Secondary | ICD-10-CM

## 2017-07-08 MED ORDER — LIRAGLUTIDE 18 MG/3ML ~~LOC~~ SOPN: 1.8000 mg | PEN_INJECTOR | Freq: Every day | SUBCUTANEOUS | 0 refills | Status: DC

## 2017-07-08 MED ORDER — METFORMIN HCL ER 750 MG PO TB24
ORAL_TABLET | ORAL | 0 refills | Status: DC
Start: 2017-07-08 — End: 2017-08-19

## 2017-07-08 MED ORDER — VITAMIN D (ERGOCALCIFEROL) 1.25 MG (50000 UNIT) PO CAPS: 50000.0000 [IU] | ORAL_CAPSULE | ORAL | 0 refills | Status: DC

## 2017-07-08 NOTE — Progress Notes (Signed)
Office: [phone]  /  Fax: [phone]   HPI:   Chief Complaint: OBESITY Tammy Mooney is here to discuss her progress with her obesity treatment plan. She is on the keep a food journal with 1400-1700 calories and 90+ grams of protein daily and is following her eating plan approximately 50 % of the time. She states she is exercising 0 minutes 0 times per week. Tammy Mooney maintained her weight. She is mindful of her eating and controls her portions. She has not been journaling all of her meals.  Her weight is 242 lb (109.8 kg) today and has not lost weight since her last visit. She has lost 18 lbs since starting treatment with [name].  Diabetes II without complication, without Insulin use Tammy Mooney has a diagnosis of diabetes type II. Tammy Mooney states she is not checking BGs at home and she denies any hypoglycemic episodes. Last A1c was 5.5 on 05/12/17. She has been working on intensive lifestyle modifications including diet, exercise, and weight loss to help control her blood glucose levels.  Vitamin D deficiency Tammy Mooney has a diagnosis of vitamin D deficiency. She is currently taking prescription Vit D, Mooney is at 35.5. She denies nausea, vomiting or muscle weakness.  At risk for osteopenia and osteoporosis Tammy Mooney is at higher risk of osteopenia and osteoporosis due to vitamin D deficiency.   ALLERGIES: Allergies  Allergen Reactions  . Adhesive [Tape] Rash and Other (See Comments)    Plastic tape causes rash, paper tape is okay, tegaderm is ok  . Penicillins Rash    MEDICATIONS: Current Outpatient Medications on File Prior to Visit  Medication Sig Dispense Refill  . busPIRone (BUSPAR) 5 MG tablet Take 1 tablet (5 mg total) by mouth 2 (two) times daily as needed. 60 tablet 1  . cetirizine (ZYRTEC) 10 MG tablet Take 10 mg by mouth daily.    . chlorhexidine (PERIDEX) 0.12 % solution Use as directed 15 mLs in the mouth or throat 2 (two) times daily.    . Insulin Pen Needle 32G X 4 MM MISC 1  Package by Does not apply route every morning. 100 each 0  . levothyroxine (SYNTHROID, LEVOTHROID) 88 MCG tablet Take 1 tablet (88 mcg total) by mouth daily before breakfast. 30 tablet 0  . liraglutide 18 MG/3ML SOPN Inject 0.3 mLs (1.8 mg total) into the skin daily. 3 pen 0  . metFORMIN (GLUCOPHAGE-XR) 750 MG 24 hr tablet TAKE 2 TABLETS BY MOUTH DAILY WITH BREAKFAST. 180 tablet 1  . MIRVASO 0.33 % GEL   2  . Vitamin D, Ergocalciferol, (DRISDOL) 50000 units CAPS capsule Take 1 capsule (50,000 Units total) by mouth every 7 (seven) days. 4 capsule 0   No current facility-administered medications on file prior to visit.     PAST MEDICAL HISTORY: Past Medical History:  Diagnosis Date  . Diabetes mellitus without complication ([name])    glyburide  . H/O varicella   . Hypothyroidism   . Infections of genitourinary tract in pregnancy, unspecified as to episode of care(646.60)   . Infertility, female   . PCOS (polycystic ovarian syndrome)     PAST SURGICAL HISTORY: Past Surgical History:  Procedure Laterality Date  . CESAREAN SECTION    . CESAREAN SECTION  07/01/2012   Procedure: CESAREAN SECTION;  Surgeon: Lovenia Kim, Tammy Mooney;  Location: [facility];  Service: Obstetrics;  Laterality: N/A;  Repeat cesarean section with delivery of baby girl at 72. Apgars 9/9.  Marland Kitchen CESAREAN SECTION N/A 10/25/2014   Procedure: Repeat CESAREAN  SECTION;  Surgeon: Brien Few, Tammy Mooney;  Location: [facility];  Service: Obstetrics;  Laterality: N/A;  EDD: 10/31/14  . EYE SURGERY     lasic  . HARDWARE REMOVAL Left 06/05/2016   Procedure: HARDWARE REMOVAL;  Surgeon: Margaretha Sheffield, Tammy Mooney;  Location: [facility];  Service: ENT;  Laterality: Left;  HARDWARE REMOVED FROM LEFT MAXILLA  . IMAGE GUIDED SINUS SURGERY N/A 06/05/2016   Procedure: IMAGE GUIDED SINUS SURGERY;  Surgeon: Margaretha Sheffield, Tammy Mooney;  Location: [facility];  Service: ENT;  Laterality: N/A;  Gave disk to cece on 11-15 kp  . MANDIBLE SURGERY    . MAXILLARY  ANTROSTOMY Right 06/05/2016   Procedure: MAXILLARY ANTROSTOMY;  Surgeon: Margaretha Sheffield, Tammy Mooney;  Location: [facility];  Service: ENT;  Laterality: Right;  . NASAL SINUS SURGERY    . SINUSOTOMY Left 06/05/2016   Procedure: SINUSOTOMY VIA CALDWELL LUC;  Surgeon: Margaretha Sheffield, Tammy Mooney;  Location: [facility];  Service: ENT;  Laterality: Left;  Diabetic -- oral meds  . TONSILLECTOMY    . TURBINATE REDUCTION Bilateral 06/05/2016   Procedure: PARTIAL TURBINATE REDUCTION;  Surgeon: Margaretha Sheffield, Tammy Mooney;  Location: [facility];  Service: ENT;  Laterality: Bilateral;    SOCIAL HISTORY: Social History   Tobacco Use  . Smoking status: Never Smoker  . Smokeless tobacco: Never Used  Substance Use Topics  . Alcohol use: No  . Drug use: No    FAMILY HISTORY: Family History  Problem Relation Age of Onset  . Diabetes Mother   . Depression Mother   . Sleep apnea Mother   . Obesity Mother   . Heart attack Father   . Hypertension Father   . Hyperlipidemia Father   . Heart disease Father   . Heart disease Maternal Grandfather   . Diabetes Paternal Grandmother     ROS: Review of Systems  Constitutional: Negative for weight loss.  Gastrointestinal: Negative for nausea and vomiting.  Musculoskeletal:       Negative muscle weakness  Endo/Heme/Allergies:       Negative hypoglycemia    PHYSICAL EXAM: Blood pressure 109/76, pulse 90, temperature 97.7 F (36.5 C), temperature source Oral, height 5\' 6"  (1.676 m), weight 242 lb (109.8 kg), SpO2 97 %, unknown if currently breastfeeding. Body mass index is 39.06 kg/m. Physical Exam  Constitutional: She is oriented to person, place, and time. She appears well-developed and well-nourished.  Cardiovascular: Normal rate.  Pulmonary/Chest: Effort normal.  Musculoskeletal: Normal range of motion.  Neurological: She is oriented to person, place, and time.  Skin: Skin is warm and dry.  Psychiatric: She has a normal mood and affect. Her  behavior is normal.  Vitals reviewed.   RECENT LABS AND TESTS: BMET    Component Value Date/Time   NA 139 05/12/2017 0805   K 4.3 05/12/2017 0805   CL 103 05/12/2017 0805   CO2 19 (L) 05/12/2017 0805   GLUCOSE 84 05/12/2017 0805   GLUCOSE 104 (H) 07/09/2016 1213   BUN 15 05/12/2017 0805   CREATININE 0.72 05/12/2017 0805   CALCIUM 9.9 05/12/2017 0805   GFRNONAA 107 05/12/2017 0805   GFRAA 124 05/12/2017 0805   Lab Results  Component Value Date   HGBA1C 5.5 05/12/2017   HGBA1C 6.6 (H) 02/09/2017   HGBA1C 6.1 07/09/2016   HGBA1C 5.8 01/03/2016   HGBA1C 6.3 (H) 10/26/2014   Lab Results  Component Value Date   INSULIN 39.3 (H) 05/12/2017   INSULIN 42.1 (H) 02/09/2017   CBC  Component Value Date/Time   WBC 9.5 05/12/2017 0805   WBC 10.6 (H) 10/26/2014 0545   RBC 5.24 05/12/2017 0805   RBC 4.47 10/26/2014 0545   HGB 15.1 05/12/2017 0805   HCT 44.1 05/12/2017 0805   PLT 394 (H) 02/09/2017 1053   MCV 84 05/12/2017 0805   MCH 28.8 05/12/2017 0805   MCH 26.8 10/26/2014 0545   MCHC 34.2 05/12/2017 0805   MCHC 33.7 10/26/2014 0545   RDW 14.4 05/12/2017 0805   LYMPHSABS 2.2 05/12/2017 0805   MONOABS 0.5 05/20/2013 0925   EOSABS 0.1 05/12/2017 0805   BASOSABS 0.1 05/12/2017 0805   Iron/TIBC/Ferritin/ %Sat No results found for: IRON, TIBC, FERRITIN, IRONPCTSAT Lipid Panel     Component Value Date/Time   CHOL 170 05/12/2017 0805   TRIG 116 05/12/2017 0805   HDL 32 (L) 05/12/2017 0805   CHOLHDL 5 05/20/2013 0925   VLDL 23.4 05/20/2013 0925   LDLCALC 115 (H) 05/12/2017 0805   Hepatic Function Panel     Component Value Date/Time   PROT 7.1 05/12/2017 0805   ALBUMIN 4.3 05/12/2017 0805   AST 20 05/12/2017 0805   ALT 40 (H) 05/12/2017 0805   ALKPHOS 86 05/12/2017 0805   BILITOT 0.4 05/12/2017 0805      Component Value Date/Time   TSH 4.460 05/12/2017 0805   TSH 5.250 (H) 02/09/2017 1053   TSH 4.08 07/09/2016 1213    ASSESSMENT AND PLAN: Type 2 diabetes  mellitus without complication, without long-term current use of insulin ([name]) - Plan: liraglutide (VICTOZA) 18 MG/3ML SOPN, metFORMIN (GLUCOPHAGE-XR) 750 MG 24 hr tablet  Vitamin D deficiency - Plan: Vitamin D, Ergocalciferol, (DRISDOL) 50000 units CAPS capsule  At risk for osteoporosis  Class 2 severe obesity with serious comorbidity and body mass index (BMI) of 39.0 to 39.9 in adult, unspecified obesity type ([place])  PLAN:  Diabetes II without complication, without Insulin use Tammy Mooney has been given extensive diabetes education by myself today including ideal fasting and post-prandial blood glucose readings, individual ideal Hgb A1c goals and hypoglycemia prevention. We discussed the importance of good blood sugar control to decrease the likelihood of diabetic complications such as nephropathy, neuropathy, limb loss, blindness, coronary artery disease, and death. We discussed the importance of intensive lifestyle modification including diet, exercise and weight loss as the first line treatment for diabetes. Tammy Mooney agrees to continue Victoza 3 pens (Pt at 1.8 mg) and we will refill for 1 month and continue taking metformin 750 mg BID #60 and we will refill for 1 month. Tammy Mooney agrees to follow up with our clinic in 2 weeks.   Vitamin D Deficiency Tammy Mooney was informed that low vitamin D levels contributes to fatigue and are associated with obesity, breast, and colon cancer. Tammy Mooney agrees to continue taking prescription Vit D @50 ,000 IU every week #4 and we will refill for 1 month. She will follow up for routine testing of vitamin D, at least 2-3 times per year. She was informed of the risk of over-replacement of vitamin D and agrees to not increase her dose unless he discusses this with [name] first. Tammy Mooney agrees to follow up with our clinic in 2 weeks.  At risk for osteopenia and osteoporosis Tammy Mooney is at risk for osteopenia and osteoporsis due to her vitamin D deficiency. She was encouraged to take  her vitamin D and follow her higher calcium diet and increase strengthening exercise to help strengthen her bones and decrease her risk of osteopenia and osteoporosis.  Obesity Tammy Mooney  is currently in the action stage of change. As such, her goal is to continue with weight loss efforts She has agreed to keep a food journal with 1400-1700 calories and 90+ grams of protein daily Tammy Mooney has been instructed to work up to a goal of 150 minutes of combined cardio and strengthening exercise per week for weight loss and overall health benefits. We discussed the following Behavioral Modification Strategies today: increasing lean protein intake and keep a strict food journal   Tammy Mooney has agreed to follow up with our clinic in 2 weeks. She was informed of the importance of frequent follow up visits to maximize her success with intensive lifestyle modifications for her multiple health conditions.  Tammy Mooney, Tammy Mooney, am acting as transcriptionist for Tammy Nip, Tammy Mooney  Tammy Mooney have reviewed the above documentation for accuracy and completeness, and Tammy Mooney agree with the above. -Tammy Nip, Tammy Mooney     Today's visit was # 10 out of 22.  Starting weight: 260 lbs Starting date: 02/09/17 Today's weight : 242 lbs  Today's date: 07/08/2017 Total lbs lost to date: 19 (Patients must lose 7 lbs in the first 6 months to continue with counseling)   ASK: We discussed the diagnosis of obesity with Tammy Mooney today and Tammy Mooney agreed to give [name] permission to discuss obesity behavioral modification therapy today.  ASSESS: Tammy Mooney has the diagnosis of obesity and her BMI today is 39.08 Tammy Mooney is in the action stage of change   ADVISE: Tammy Mooney was educated on the multiple health risks of obesity as well as the benefit of weight loss to improve her health. She was advised of the need for long term treatment and the importance of lifestyle modifications.  AGREE: Multiple dietary modification options and  treatment options were discussed and  Adalynne agreed to keep a food journal with 1400-1700 calories and 90+ grams of protein daily We discussed the following Behavioral Modification Strategies today: increasing lean protein intake and keep a strict food journal

## 2017-07-09 ENCOUNTER — Encounter: Payer: Self-pay | Admitting: Family Medicine

## 2017-07-09 NOTE — Progress Notes (Signed)
Subjective:    Tammy Mooney is a 36 y.o. female and is here for a comprehensive physical exam.  Pertinent Gynecological History: No LMP recorded. Patient is not currently having periods (Reason: IUD). Sexually active with her husband. Not completely sure that she is finished having children. Previously followed at [facility]. Has PCOS.   OB History    Gravida Para Term Preterm AB Living   4 3 2 1   3    SAB TAB Ectopic Multiple Live Births         0 3     Health Maintenance Due  Topic Date Due  . PNEUMOCOCCAL POLYSACCHARIDE VACCINE (1) 10/18/1981  . OPHTHALMOLOGY EXAM  10/18/1989  . TETANUS/TDAP  10/19/1998  . PAP SMEAR  10/18/2000  . URINE MICROALBUMIN  01/02/2017  . FOOT EXAM  07/09/2017   PMHx, SurgHx, SocialHx, Medications, and Allergies were reviewed in the Visit Navigator and updated as appropriate.   Past Medical History:  Diagnosis Date  . DM (diabetes mellitus), type 2 ([place])   . Hypothyroidism   . Infertility, female   . PCOS (polycystic ovarian syndrome)   . Varicella     Past Surgical History:  Procedure Laterality Date  . CESAREAN SECTION    . CESAREAN SECTION  07/01/2012   Procedure: CESAREAN SECTION;  Surgeon: Lovenia Kim, MD;  Location: [facility];  Service: Obstetrics;  Laterality: N/A;  Repeat cesarean section with delivery of baby girl at 72. Apgars 9/9.  Marland Kitchen CESAREAN SECTION N/A 10/25/2014   Procedure: Repeat CESAREAN SECTION;  Surgeon: Brien Few, MD;  Location: [facility];  Service: Obstetrics;  Laterality: N/A;  EDD: 10/31/14  . EYE SURGERY     lasic  . HARDWARE REMOVAL Left 06/05/2016   Procedure: HARDWARE REMOVAL;  Surgeon: Margaretha Sheffield, MD;  Location: [facility];  Service: ENT;  Laterality: Left;  HARDWARE REMOVED FROM LEFT MAXILLA  . IMAGE GUIDED SINUS SURGERY N/A 06/05/2016   Procedure: IMAGE GUIDED SINUS SURGERY;  Surgeon: Margaretha Sheffield, MD;  Location: [facility];  Service: ENT;  Laterality: N/A;  Gave disk to  cece on 11-15 kp  . MANDIBLE SURGERY    . MAXILLARY ANTROSTOMY Right 06/05/2016   Procedure: MAXILLARY ANTROSTOMY;  Surgeon: Margaretha Sheffield, MD;  Location: [facility];  Service: ENT;  Laterality: Right;  . NASAL SINUS SURGERY    . SINUSOTOMY Left 06/05/2016   Procedure: SINUSOTOMY VIA CALDWELL LUC;  Surgeon: Margaretha Sheffield, MD;  Location: [facility];  Service: ENT;  Laterality: Left;  Diabetic -- oral meds  . TONSILLECTOMY    . TURBINATE REDUCTION Bilateral 06/05/2016   Procedure: PARTIAL TURBINATE REDUCTION;  Surgeon: Margaretha Sheffield, MD;  Location: [facility];  Service: ENT;  Laterality: Bilateral;    Family History  Problem Relation Age of Onset  . Diabetes Mother   . Depression Mother   . Sleep apnea Mother   . Obesity Mother   . Heart attack Father   . Hypertension Father   . Hyperlipidemia Father   . Heart disease Father   . Heart disease Maternal Grandfather   . Diabetes Paternal Grandmother    Social History   Tobacco Use  . Smoking status: Never Smoker  . Smokeless tobacco: Never Used  Substance Use Topics  . Alcohol use: No  . Drug use: No    Review of Systems:   Pertinent items are noted in the HPI. Otherwise, ROS is negative.  Objective:   BP 110/66  Pulse 88   Temp 98.2 F (36.8 C) (Oral)   Ht 5\' 6"  (1.676 m)   Wt 245 lb 9.6 oz (111.4 kg)   SpO2 96%   BMI 39.64 kg/m    Wt Readings from Last 3 Encounters:  07/10/17 245 lb 9.6 oz (111.4 kg)  07/08/17 242 lb (109.8 kg)  07/02/17 242 lb (109.8 kg)     Ht Readings from Last 3 Encounters:  07/10/17 5\' 6"  (1.676 m)  07/08/17 5\' 6"  (1.676 m)  07/02/17 5\' 6"  (1.676 m)   General appearance: alert, cooperative and appears stated age. Head: normocephalic, without obvious abnormality, atraumatic. Neck: no adenopathy, supple, symmetrical, trachea midline; thyroid not enlarged, symmetric, no tenderness/mass/nodules. Lungs: clear to auscultation bilaterally. Heart: regular rate and  rhythm Abdomen: soft, non-tender; no masses,  no organomegaly. Extremities: extremities normal, atraumatic, no cyanosis or edema. Skin: skin color, texture, turgor normal, no rashes or lesions. Lymph: cervical, supraclavicular, and axillary nodes normal; no abnormal inguinal nodes palpated. Neurologic: grossly normal.  Pelvic:  External genitalia: no lesions.              Urethra: normal appearing urethra with no masses, tenderness or lesions.              Bartholins and Skenes: normal.               Vagina: normal appearing vagina with normal color and discharge, no lesions.              Cervix: normal appearance. IUD strings visualized.              Pap and high risk HPV testing done: Yes.  .  Diabetic Foot Exam - Simple   Simple Foot Form Diabetic Foot exam was performed with the following findings:  Yes 07/11/2017  1:42 PM  Visual Inspection No deformities, no ulcerations, no other skin breakdown bilaterally:  Yes Sensation Testing Intact to touch and monofilament testing bilaterally:  Yes Pulse Check Posterior Tibialis and Dorsalis pulse intact bilaterally:  Yes Comments     Results for orders placed or performed in visit on 05/12/17  Vitamin B12  Result Value Ref Range   Vitamin B-12 320 232 - 1,245 pg/mL  CBC With Differential  Result Value Ref Range   WBC 9.5 3.4 - 10.8 x10E3/uL   RBC 5.24 3.77 - 5.28 x10E6/uL   Hemoglobin 15.1 11.1 - 15.9 g/dL   Hematocrit 44.1 34.0 - 46.6 %   MCV 84 79 - 97 fL   MCH 28.8 26.6 - 33.0 pg   MCHC 34.2 31.5 - 35.7 g/dL   RDW 14.4 12.3 - 15.4 %   Neutrophils 68 Not Estab. %   Lymphs 23 Not Estab. %   Monocytes 6 Not Estab. %   Eos 2 Not Estab. %   Basos 1 Not Estab. %   Neutrophils Absolute 6.5 1.4 - 7.0 x10E3/uL   Lymphocytes Absolute 2.2 0.7 - 3.1 x10E3/uL   Monocytes Absolute 0.5 0.1 - 0.9 x10E3/uL   EOS (ABSOLUTE) 0.1 0.0 - 0.4 x10E3/uL   Basophils Absolute 0.1 0.0 - 0.2 x10E3/uL   Immature Granulocytes 0 Not Estab. %    Immature Grans (Abs) 0.0 0.0 - 0.1 x10E3/uL  Comprehensive metabolic panel  Result Value Ref Range   Glucose 84 65 - 99 mg/dL   BUN 15 6 - 20 mg/dL   Creatinine, Ser 0.72 0.57 - 1.00 mg/dL   GFR calc non Af Amer 107 >59 mL/min/1.73   GFR  calc Af Amer 124 >59 mL/min/1.73   BUN/Creatinine Ratio 21 9 - 23   Sodium 139 134 - 144 mmol/L   Potassium 4.3 3.5 - 5.2 mmol/L   Chloride 103 96 - 106 mmol/L   CO2 19 (L) 20 - 29 mmol/L   Calcium 9.9 8.7 - 10.2 mg/dL   Total Protein 7.1 6.0 - 8.5 g/dL   Albumin 4.3 3.5 - 5.5 g/dL   Globulin, Total 2.8 1.5 - 4.5 g/dL   Albumin/Globulin Ratio 1.5 1.2 - 2.2   Bilirubin Total 0.4 0.0 - 1.2 mg/dL   Alkaline Phosphatase 86 39 - 117 IU/L   AST 20 0 - 40 IU/L   ALT 40 (H) 0 - 32 IU/L  Folate  Result Value Ref Range   Folate 12.5 >3.0 ng/mL  Hemoglobin A1c  Result Value Ref Range   Hgb A1c MFr Bld 5.5 4.8 - 5.6 %   Est. average glucose Bld gHb Est-mCnc 111 mg/dL  Insulin, random  Result Value Ref Range   INSULIN 39.3 (H) 2.6 - 24.9 uIU/mL  Lipid Panel With LDL/HDL Ratio  Result Value Ref Range   Cholesterol, Total 170 100 - 199 mg/dL   Triglycerides 116 0 - 149 mg/dL   HDL 32 (L) >39 mg/dL   VLDL Cholesterol Cal 23 5 - 40 mg/dL   LDL Calculated 115 (H) 0 - 99 mg/dL   LDl/HDL Ratio 3.6 (H) 0.0 - 3.2 ratio  T3  Result Value Ref Range   T3, Total 125 71 - 180 ng/dL  T4, free  Result Value Ref Range   Free T4 1.46 0.82 - 1.77 ng/dL  TSH  Result Value Ref Range   TSH 4.460 0.450 - 4.500 uIU/mL  VITAMIN D 25 Hydroxy (Vit-D Deficiency, Fractures)  Result Value Ref Range   Vit D, 25-Hydroxy 35.5 30.0 - 100.0 ng/mL   Assessment/Plan:   Nabila was seen today for annual exam.  Diagnoses and all orders for this visit:  Routine physical examination  Screening for cervical cancer -     Cytology - PAP  Dyslipidemia associated with type 2 diabetes mellitus ([place])  Type 2 diabetes mellitus without complication, without long-term current use  of insulin ([name])    Patient Counseling:   [x]     Nutrition: Stressed importance of moderation in sodium/caffeine intake, saturated fat and cholesterol, caloric balance, sufficient intake of fresh fruits, vegetables, fiber, calcium, iron, and 1 mg of folate supplement per day (for females capable of pregnancy).   [x]      Stressed the importance of regular exercise.    [x]     Substance Abuse: Discussed cessation/primary prevention of tobacco, alcohol, or other drug use; driving or other dangerous activities under the influence; availability of treatment for abuse.    [x]      Injury prevention: Discussed safety belts, safety helmets, smoke detector, smoking near bedding or upholstery.    [x]      Sexuality: Discussed sexually transmitted diseases, partner selection, use of condoms, avoidance of unintended pregnancy  and contraceptive alternatives.    [x]     Dental health: Discussed importance of regular tooth brushing, flossing, and dental visits.   [x]      Health maintenance and immunizations reviewed. Please refer to Health maintenance section.   Briscoe Deutscher, DO [place]

## 2017-07-10 ENCOUNTER — Other Ambulatory Visit (HOSPITAL_COMMUNITY)
Admission: RE | Admit: 2017-07-10 | Discharge: 2017-07-10 | Disposition: A | Discharge status: Home / Self Care | Payer: 59 | Source: Ambulatory Visit | Attending: Family Medicine | Admitting: Family Medicine

## 2017-07-10 ENCOUNTER — Encounter: Payer: 59 | Admitting: Family Medicine

## 2017-07-10 ENCOUNTER — Encounter: Payer: Self-pay | Admitting: Family Medicine

## 2017-07-10 ENCOUNTER — Ambulatory Visit (INDEPENDENT_AMBULATORY_CARE_PROVIDER_SITE_OTHER): Payer: 59 | Admitting: Family Medicine

## 2017-07-10 VITALS — BP 110/66 | HR 88 | Temp 98.2°F | Ht 66.0 in | Wt 245.6 lb

## 2017-07-10 DIAGNOSIS — Z124 Encounter for screening for malignant neoplasm of cervix: Secondary | ICD-10-CM | POA: Insufficient documentation

## 2017-07-10 DIAGNOSIS — Z0000 Encounter for general adult medical examination without abnormal findings: Secondary | ICD-10-CM | POA: Diagnosis not present

## 2017-07-11 ENCOUNTER — Encounter: Payer: Self-pay | Admitting: Family Medicine

## 2017-07-14 LAB — CYTOLOGY - PAP
Diagnosis: NEGATIVE
HPV: NOT DETECTED

## 2017-07-20 MED FILL — VIT D2 1.25 MG (50,000 UNIT: 1.25 MG | 28 days supply | Qty: 4 | Fill #0

## 2017-07-23 ENCOUNTER — Ambulatory Visit (INDEPENDENT_AMBULATORY_CARE_PROVIDER_SITE_OTHER): Payer: 59 | Admitting: Family Medicine

## 2017-07-23 VITALS — BP 104/68 | HR 72 | Temp 97.6°F | Ht 66.0 in | Wt 245.0 lb

## 2017-07-23 DIAGNOSIS — E119 Type 2 diabetes mellitus without complications: Secondary | ICD-10-CM

## 2017-07-23 DIAGNOSIS — Z6839 Body mass index (BMI) 39.0-39.9, adult: Secondary | ICD-10-CM | POA: Diagnosis not present

## 2017-07-23 NOTE — Progress Notes (Signed)
Office: [phone]  /  Fax: [phone]   HPI:   Chief Complaint: OBESITY Maitland is here to discuss her progress with her obesity treatment plan. She is on the keep a food journal with 1400 to 1700 calories and 90+ grams of protein daily and is following her eating plan approximately 10 % of the time. She states she is exercising 0 minutes 0 times per week. Makenzee was off track over the holidays and increased simple carbohydrates. She is ready to get back to a structured plan and is ready to start exercise. Her weight is 245 lb (111.1 kg) today and has had a weight gain of 3 pounds over a period of 2 weeks since her last visit. She has lost 15 lbs since starting treatment with [name].  Diabetes II Kirstein has a diagnosis of diabetes type II. She is off track with diet and has skipped some doses of Victoza over the holidays. Analiah is not checking BGs at home and denies any hypoglycemic episodes. She has been working on intensive lifestyle modifications including diet, exercise, and weight loss to help control her blood glucose levels. She is ready to get back on track.  ALLERGIES: Allergies  Allergen Reactions  . Adhesive [Tape] Rash and Other (See Comments)    Plastic tape causes rash, paper tape is okay, tegaderm is ok  . Penicillins Rash    MEDICATIONS: Current Outpatient Medications on File Prior to Visit  Medication Sig Dispense Refill  . busPIRone (BUSPAR) 5 MG tablet Take 1 tablet (5 mg total) by mouth 2 (two) times daily as needed. 60 tablet 1  . cetirizine (ZYRTEC) 10 MG tablet Take 10 mg by mouth daily.    . chlorhexidine (PERIDEX) 0.12 % solution Use as directed 15 mLs in the mouth or throat 2 (two) times daily.    . Insulin Pen Needle 32G X 4 MM MISC 1 Package by Does not apply route every morning. 100 each 0  . levothyroxine (SYNTHROID, LEVOTHROID) 88 MCG tablet Take 1 tablet (88 mcg total) by mouth daily before breakfast. 30 tablet 0  . liraglutide (VICTOZA) 18 MG/3ML  SOPN Inject 0.3 mLs (1.8 mg total) into the skin daily. 3 pen 0  . metFORMIN (GLUCOPHAGE-XR) 750 MG 24 hr tablet TAKE 2 TABLETS BY MOUTH DAILY WITH BREAKFAST. 60 tablet 0  . MIRVASO 0.33 % GEL   2  . Vitamin D, Ergocalciferol, (DRISDOL) 50000 units CAPS capsule Take 1 capsule (50,000 Units total) by mouth every 7 (seven) days. 4 capsule 0   No current facility-administered medications on file prior to visit.     PAST MEDICAL HISTORY: Past Medical History:  Diagnosis Date  . DM (diabetes mellitus), type 2 ([place])   . Hypothyroidism   . Infertility, female   . PCOS (polycystic ovarian syndrome)   . Varicella     PAST SURGICAL HISTORY: Past Surgical History:  Procedure Laterality Date  . CESAREAN SECTION    . CESAREAN SECTION  07/01/2012   Procedure: CESAREAN SECTION;  Surgeon: Lovenia Kim, MD;  Location: [facility];  Service: Obstetrics;  Laterality: N/A;  Repeat cesarean section with delivery of baby girl at 67. Apgars 9/9.  Marland Kitchen CESAREAN SECTION N/A 10/25/2014   Procedure: Repeat CESAREAN SECTION;  Surgeon: Brien Few, MD;  Location: [facility];  Service: Obstetrics;  Laterality: N/A;  EDD: 10/31/14  . EYE SURGERY     lasic  . HARDWARE REMOVAL Left 06/05/2016   Procedure: HARDWARE REMOVAL;  Surgeon: Margaretha Sheffield, MD;  Location: [facility];  Service: ENT;  Laterality: Left;  HARDWARE REMOVED FROM LEFT MAXILLA  . IMAGE GUIDED SINUS SURGERY N/A 06/05/2016   Procedure: IMAGE GUIDED SINUS SURGERY;  Surgeon: Margaretha Sheffield, MD;  Location: [facility];  Service: ENT;  Laterality: N/A;  Gave disk to cece on 11-15 kp  . MANDIBLE SURGERY    . MAXILLARY ANTROSTOMY Right 06/05/2016   Procedure: MAXILLARY ANTROSTOMY;  Surgeon: Margaretha Sheffield, MD;  Location: [facility];  Service: ENT;  Laterality: Right;  . NASAL SINUS SURGERY    . SINUSOTOMY Left 06/05/2016   Procedure: SINUSOTOMY VIA CALDWELL LUC;  Surgeon: Margaretha Sheffield, MD;  Location: [facility];  Service: ENT;   Laterality: Left;  Diabetic -- oral meds  . TONSILLECTOMY    . TURBINATE REDUCTION Bilateral 06/05/2016   Procedure: PARTIAL TURBINATE REDUCTION;  Surgeon: Margaretha Sheffield, MD;  Location: [facility];  Service: ENT;  Laterality: Bilateral;    SOCIAL HISTORY: Social History   Tobacco Use  . Smoking status: Never Smoker  . Smokeless tobacco: Never Used  Substance Use Topics  . Alcohol use: No  . Drug use: No    FAMILY HISTORY: Family History  Problem Relation Age of Onset  . Diabetes Mother   . Depression Mother   . Sleep apnea Mother   . Obesity Mother   . Heart attack Father   . Hypertension Father   . Hyperlipidemia Father   . Heart disease Father   . Heart disease Maternal Grandfather   . Diabetes Paternal Grandmother     ROS: Review of Systems  Constitutional: Negative for weight loss.  Endo/Heme/Allergies:       Negative hypoglycemia    PHYSICAL EXAM: Blood pressure 104/68, pulse 72, temperature 97.6 F (36.4 C), temperature source Oral, height 5\' 6"  (1.676 m), weight 245 lb (111.1 kg), SpO2 98 %, unknown if currently breastfeeding. Body mass index is 39.54 kg/m. Physical Exam  Constitutional: She is oriented to person, place, and time. She appears well-developed and well-nourished.  Cardiovascular: Normal rate.  Pulmonary/Chest: Effort normal.  Musculoskeletal: Normal range of motion.  Neurological: She is oriented to person, place, and time.  Skin: Skin is warm and dry.  Psychiatric: She has a normal mood and affect. Her behavior is normal.  Vitals reviewed.   RECENT LABS AND TESTS: BMET    Component Value Date/Time   NA 139 05/12/2017 0805   K 4.3 05/12/2017 0805   CL 103 05/12/2017 0805   CO2 19 (L) 05/12/2017 0805   GLUCOSE 84 05/12/2017 0805   GLUCOSE 104 (H) 07/09/2016 1213   BUN 15 05/12/2017 0805   CREATININE 0.72 05/12/2017 0805   CALCIUM 9.9 05/12/2017 0805   GFRNONAA 107 05/12/2017 0805   GFRAA 124 05/12/2017 0805   Lab  Results  Component Value Date   HGBA1C 5.5 05/12/2017   HGBA1C 6.6 (H) 02/09/2017   HGBA1C 6.1 07/09/2016   HGBA1C 5.8 01/03/2016   HGBA1C 6.3 (H) 10/26/2014   Lab Results  Component Value Date   INSULIN 39.3 (H) 05/12/2017   INSULIN 42.1 (H) 02/09/2017   CBC    Component Value Date/Time   WBC 9.5 05/12/2017 0805   WBC 10.6 (H) 10/26/2014 0545   RBC 5.24 05/12/2017 0805   RBC 4.47 10/26/2014 0545   HGB 15.1 05/12/2017 0805   HCT 44.1 05/12/2017 0805   PLT 394 (H) 02/09/2017 1053   MCV 84 05/12/2017 0805   MCH 28.8 05/12/2017 0805  MCH 26.8 10/26/2014 0545   MCHC 34.2 05/12/2017 0805   MCHC 33.7 10/26/2014 0545   RDW 14.4 05/12/2017 0805   LYMPHSABS 2.2 05/12/2017 0805   MONOABS 0.5 05/20/2013 0925   EOSABS 0.1 05/12/2017 0805   BASOSABS 0.1 05/12/2017 0805   Iron/TIBC/Ferritin/ %Sat No results found for: IRON, TIBC, FERRITIN, IRONPCTSAT Lipid Panel     Component Value Date/Time   CHOL 170 05/12/2017 0805   TRIG 116 05/12/2017 0805   HDL 32 (L) 05/12/2017 0805   CHOLHDL 5 05/20/2013 0925   VLDL 23.4 05/20/2013 0925   LDLCALC 115 (H) 05/12/2017 0805   Hepatic Function Panel     Component Value Date/Time   PROT 7.1 05/12/2017 0805   ALBUMIN 4.3 05/12/2017 0805   AST 20 05/12/2017 0805   ALT 40 (H) 05/12/2017 0805   ALKPHOS 86 05/12/2017 0805   BILITOT 0.4 05/12/2017 0805      Component Value Date/Time   TSH 4.460 05/12/2017 0805   TSH 5.250 (H) 02/09/2017 1053   TSH 4.08 07/09/2016 1213    ASSESSMENT AND PLAN: Type 2 diabetes mellitus without complication, without long-term current use of insulin ([name])  Class 2 severe obesity with serious comorbidity and body mass index (BMI) of 39.0 to 39.9 in adult, unspecified obesity type ([place])  PLAN:  Diabetes II Shadae has been given extensive diabetes education by myself today including ideal fasting and post-prandial blood glucose readings, individual ideal Hgb A1c goals and hypoglycemia prevention. We  discussed the importance of good blood sugar control to decrease the likelihood of diabetic complications such as nephropathy, neuropathy, limb loss, blindness, coronary artery disease, and death. We discussed the importance of intensive lifestyle modification including diet, exercise and weight loss as the first line treatment for diabetes. Araseli agrees to re-start checking her blood sugar and continue her diabetes medications. Eraina agrees to get back to diabetic diet and will follow up at the agreed upon time.  We spent > than 50% of the 30 minute visit on the counseling as documented in the note.  Obesity Rosetta is currently in the action stage of change. As such, her goal is to continue with weight loss efforts She has agreed to follow the Category 3 plan + breakfast options Melodye has been instructed to work up to a goal of 150 minutes of combined cardio and strengthening exercise per week for weight loss and overall health benefits. We discussed the following Behavioral Modification Strategies today: increasing lean protein intake, keeping healthy foods in the home and work on meal planning and easy cooking plans  Sashay has agreed to follow up with our clinic in 2 weeks. She was informed of the importance of frequent follow up visits to maximize her success with intensive lifestyle modifications for her multiple health conditions.  Corey Skains, am acting as transcriptionist for Dennard Nip, MD   OBESITY BEHAVIORAL INTERVENTION VISIT  Today's visit was # 11 out of 22.  Starting weight: 260 lbs Starting date: 02/09/17 Today's weight : 245 lbs  Today's date: 07/23/2017 Total lbs lost to date: 15 (Patients must lose 7 lbs in the first 6 months to continue with counseling)   ASK: We discussed the diagnosis of obesity with Lorenza Cambridge today and Fawne agreed to give [name] permission to discuss obesity behavioral modification therapy today.  ASSESS: Dyani has the  diagnosis of obesity and her BMI today is 39.56 Tynlee is in the action stage of change   ADVISE: Danna was educated  on the multiple health risks of obesity as well as the benefit of weight loss to improve her health. She was advised of the need for long term treatment and the importance of lifestyle modifications.  AGREE: Multiple dietary modification options and treatment options were discussed and  Tomara agreed to follow the Category 3 plan + breakfast options We discussed the following Behavioral Modification Strategies today: increasing lean protein intake, keeping healthy foods in the home and work on meal planning and easy cooking plans  I have reviewed the above documentation for accuracy and completeness, and I agree with the above. -Dennard Nip, MD

## 2017-08-05 ENCOUNTER — Ambulatory Visit (INDEPENDENT_AMBULATORY_CARE_PROVIDER_SITE_OTHER): Payer: 59 | Admitting: Family Medicine

## 2017-08-05 VITALS — BP 107/74 | HR 68 | Temp 97.9°F | Ht 66.0 in | Wt 247.0 lb

## 2017-08-05 DIAGNOSIS — E038 Other specified hypothyroidism: Secondary | ICD-10-CM | POA: Diagnosis not present

## 2017-08-05 DIAGNOSIS — Z9189 Other specified personal risk factors, not elsewhere classified: Secondary | ICD-10-CM | POA: Diagnosis not present

## 2017-08-05 DIAGNOSIS — E119 Type 2 diabetes mellitus without complications: Secondary | ICD-10-CM | POA: Diagnosis not present

## 2017-08-05 DIAGNOSIS — Z6839 Body mass index (BMI) 39.0-39.9, adult: Secondary | ICD-10-CM | POA: Diagnosis not present

## 2017-08-05 MED ORDER — INSULIN PEN NEEDLE 32G X 4 MM MISC: 1.0000 | Freq: Every morning | 0 refills | Status: DC

## 2017-08-05 MED ORDER — LEVOTHYROXINE SODIUM 88 MCG PO TABS: 88.0000 ug | ORAL_TABLET | Freq: Every day | ORAL | 0 refills | Status: DC

## 2017-08-05 MED ORDER — LIRAGLUTIDE 18 MG/3ML ~~LOC~~ SOPN: 1.8000 mg | PEN_INJECTOR | Freq: Every day | SUBCUTANEOUS | 0 refills | Status: DC

## 2017-08-05 MED FILL — LEVOTHYROXINE 88 MCG TABLET: 88 | 30 days supply | Qty: 30 | Fill #0

## 2017-08-05 MED FILL — VICTOZA 18 MG/3 ML INJECT P: 18 | 30 days supply | Qty: 9 | Fill #0

## 2017-08-05 NOTE — Progress Notes (Signed)
Office: [phone]  /  Fax: [phone]   HPI:   Chief Complaint: OBESITY Tammy Mooney is here to discuss her progress with her obesity treatment plan. She is on the Category 3 plan with breakfast options and is following her eating plan approximately 50 % of the time. She states she is walking and band exercising for 10 minutes 2 times per week. Tammy Mooney hasn't taken Victoza for possibly 10 days. She has had increase cravings since being off Victoza. She had some problems with getting for for Category 3 plan after the holidays.  Her weight is 247 lb (112 kg) today and has gained 2 pounds since her last visit. She has lost 13 lbs since starting treatment with Tammy Mooney.  Diabetes II Tammy Mooney has a diagnosis of diabetes type II. Tammy Mooney is attempting to get set up with Tammy Mooney, so she is off Victoza until Tammy Mooney will pay for it. She states fasting BGs lower than 110 but not 400. She is at risk of deficit knowledge of uncontrolled diabetes. She denies any hypoglycemic episodes. Last A1c was 5.5 on 05/12/17. She has been working on intensive lifestyle modifications including diet, exercise, and weight loss to help control her blood glucose levels.  Hypothyroid Tammy Mooney has a diagnosis of hypothyroidism. She is on levothyroxine. She denies hot or cold intolerance or palpitations. Last TSH of 4.46.  ALLERGIES: Allergies  Allergen Reactions  . Adhesive [Tape] Rash and Other (See Comments)    Plastic tape causes rash, paper tape is okay, tegaderm is ok  . Penicillins Rash    MEDICATIONS: Current Outpatient Medications on File Prior to Visit  Medication Sig Dispense Refill  . busPIRone (BUSPAR) 5 MG tablet Take 1 tablet (5 mg total) by mouth 2 (two) times daily as needed. 60 tablet 1  . cetirizine (ZYRTEC) 10 MG tablet Take 10 mg by mouth daily.    . chlorhexidine (PERIDEX) 0.12 % solution Use as directed 15 mLs in the mouth or throat 2 (two) times daily.    . Insulin Pen Needle 32G X 4 MM MISC 1  Package by Does not apply route every morning. 100 each 0  . levothyroxine (SYNTHROID, LEVOTHROID) 88 MCG tablet Take 1 tablet (88 mcg total) by mouth daily before breakfast. 30 tablet 0  . liraglutide (VICTOZA) 18 MG/3ML SOPN Inject 0.3 mLs (1.8 mg total) into the skin daily. 3 pen 0  . metFORMIN (GLUCOPHAGE-XR) 750 MG 24 hr tablet TAKE 2 TABLETS BY MOUTH DAILY WITH BREAKFAST. 60 tablet 0  . MIRVASO 0.33 % GEL   2  . Vitamin D, Ergocalciferol, (DRISDOL) 50000 units CAPS capsule Take 1 capsule (50,000 Units total) by mouth every 7 (seven) days. 4 capsule 0   No current facility-administered medications on file prior to visit.     PAST MEDICAL HISTORY: Past Medical History:  Diagnosis Date  . DM (diabetes mellitus), type 2 (Tammy Mooney)   . Hypothyroidism   . Infertility, female   . PCOS (polycystic ovarian syndrome)   . Varicella     PAST SURGICAL HISTORY: Past Surgical History:  Procedure Laterality Date  . CESAREAN SECTION    . CESAREAN SECTION  07/01/2012   Procedure: CESAREAN SECTION;  Surgeon: Lovenia Kim, MD;  Location: [facility];  Service: Obstetrics;  Laterality: N/A;  Repeat cesarean section with delivery of baby girl at 22. Apgars 9/9.  Marland Kitchen CESAREAN SECTION N/A 10/25/2014   Procedure: Repeat CESAREAN SECTION;  Surgeon: Brien Few, MD;  Location: [facility];  Service: Obstetrics;  Laterality:  N/A;  EDD: 10/31/14  . EYE SURGERY     lasic  . HARDWARE REMOVAL Left 06/05/2016   Procedure: HARDWARE REMOVAL;  Surgeon: Margaretha Sheffield, MD;  Location: [facility];  Service: ENT;  Laterality: Left;  HARDWARE REMOVED FROM LEFT MAXILLA  . IMAGE GUIDED SINUS SURGERY N/A 06/05/2016   Procedure: IMAGE GUIDED SINUS SURGERY;  Surgeon: Margaretha Sheffield, MD;  Location: [facility];  Service: ENT;  Laterality: N/A;  Gave disk to cece on 11-15 kp  . MANDIBLE SURGERY    . MAXILLARY ANTROSTOMY Right 06/05/2016   Procedure: MAXILLARY ANTROSTOMY;  Surgeon: Margaretha Sheffield, MD;  Location: [facility];  Service: ENT;  Laterality: Right;  . NASAL SINUS SURGERY    . SINUSOTOMY Left 06/05/2016   Procedure: SINUSOTOMY VIA CALDWELL LUC;  Surgeon: Margaretha Sheffield, MD;  Location: [facility];  Service: ENT;  Laterality: Left;  Diabetic -- oral meds  . TONSILLECTOMY    . TURBINATE REDUCTION Bilateral 06/05/2016   Procedure: PARTIAL TURBINATE REDUCTION;  Surgeon: Margaretha Sheffield, MD;  Location: [facility];  Service: ENT;  Laterality: Bilateral;    SOCIAL HISTORY: Social History   Tobacco Use  . Smoking status: Never Smoker  . Smokeless tobacco: Never Used  Substance Use Topics  . Alcohol use: No  . Drug use: No    FAMILY HISTORY: Family History  Problem Relation Age of Onset  . Diabetes Mother   . Depression Mother   . Sleep apnea Mother   . Obesity Mother   . Heart attack Father   . Hypertension Father   . Hyperlipidemia Father   . Heart disease Father   . Heart disease Maternal Grandfather   . Diabetes Paternal Grandmother     ROS: Review of Systems  Constitutional: Negative for weight loss.       Negative hot/cold intolerance  Cardiovascular: Negative for palpitations.  Endo/Heme/Allergies:       Negative hypoglycemia    PHYSICAL EXAM: Blood pressure 107/74, pulse 68, temperature 97.9 F (36.6 C), temperature source Oral, height 5\' 6"  (1.676 m), weight 247 lb (112 kg), SpO2 97 %, unknown if currently breastfeeding. Body mass index is 39.87 kg/m. Physical Exam  Constitutional: She is oriented to person, place, and time. She appears well-developed and well-nourished.  Cardiovascular: Normal rate.  Pulmonary/Chest: Effort normal.  Musculoskeletal: Normal range of motion.  Neurological: She is oriented to person, place, and time.  Skin: Skin is warm and dry.  Psychiatric: She has a normal mood and affect. Her behavior is normal.  Vitals reviewed.   RECENT LABS AND TESTS: BMET    Component Value Date/Time   NA 139 05/12/2017 0805   K  4.3 05/12/2017 0805   CL 103 05/12/2017 0805   CO2 19 (L) 05/12/2017 0805   GLUCOSE 84 05/12/2017 0805   GLUCOSE 104 (H) 07/09/2016 1213   BUN 15 05/12/2017 0805   CREATININE 0.72 05/12/2017 0805   CALCIUM 9.9 05/12/2017 0805   GFRNONAA 107 05/12/2017 0805   GFRAA 124 05/12/2017 0805   Lab Results  Component Value Date   HGBA1C 5.5 05/12/2017   HGBA1C 6.6 (H) 02/09/2017   HGBA1C 6.1 07/09/2016   HGBA1C 5.8 01/03/2016   HGBA1C 6.3 (H) 10/26/2014   Lab Results  Component Value Date   INSULIN 39.3 (H) 05/12/2017   INSULIN 42.1 (H) 02/09/2017   CBC    Component Value Date/Time   WBC 9.5 05/12/2017 0805   WBC 10.6 (H) 10/26/2014 0545  RBC 5.24 05/12/2017 0805   RBC 4.47 10/26/2014 0545   HGB 15.1 05/12/2017 0805   HCT 44.1 05/12/2017 0805   PLT 394 (H) 02/09/2017 1053   MCV 84 05/12/2017 0805   MCH 28.8 05/12/2017 0805   MCH 26.8 10/26/2014 0545   MCHC 34.2 05/12/2017 0805   MCHC 33.7 10/26/2014 0545   RDW 14.4 05/12/2017 0805   LYMPHSABS 2.2 05/12/2017 0805   MONOABS 0.5 05/20/2013 0925   EOSABS 0.1 05/12/2017 0805   BASOSABS 0.1 05/12/2017 0805   Iron/TIBC/Ferritin/ %Sat No results found for: IRON, TIBC, FERRITIN, IRONPCTSAT Lipid Panel     Component Value Date/Time   CHOL 170 05/12/2017 0805   TRIG 116 05/12/2017 0805   HDL 32 (L) 05/12/2017 0805   CHOLHDL 5 05/20/2013 0925   VLDL 23.4 05/20/2013 0925   LDLCALC 115 (H) 05/12/2017 0805   Hepatic Function Panel     Component Value Date/Time   PROT 7.1 05/12/2017 0805   ALBUMIN 4.3 05/12/2017 0805   AST 20 05/12/2017 0805   ALT 40 (H) 05/12/2017 0805   ALKPHOS 86 05/12/2017 0805   BILITOT 0.4 05/12/2017 0805      Component Value Date/Time   TSH 4.460 05/12/2017 0805   TSH 5.250 (H) 02/09/2017 1053   TSH 4.08 07/09/2016 1213    ASSESSMENT AND PLAN: Type 2 diabetes mellitus without complication, without long-term current use of insulin (Tammy Mooney) - Plan: liraglutide (VICTOZA) 18 MG/3ML SOPN, Insulin Pen  Needle 32G X 4 MM MISC  Other specified hypothyroidism - Plan: levothyroxine (SYNTHROID, LEVOTHROID) 88 MCG tablet  Class 2 severe obesity with serious comorbidity and body mass index (BMI) of 39.0 to 39.9 in adult, unspecified obesity type (Tammy Mooney)  PLAN:  Diabetes II Tammy Mooney has been given extensive diabetes education by myself today including ideal fasting and post-prandial blood glucose readings, individual ideal Hgb A1c goals and hypoglycemia prevention. We discussed the importance of good blood sugar control as well as adherence to medications in order to decrease the likelihood of diabetic complications such as nephropathy, neuropathy, limb loss, blindness, coronary artery disease, and death. We discussed the importance of intensive lifestyle modification including diet, exercise and weight loss as the first line treatment for diabetes. Tammy Mooney agrees continue testing BGs at home; Call pharmacy to get back on Victoza. Tammy Mooney agrees to continue Victoza at 1.25 mg qd for 4 days then she can increase to 1.5 mg qd and we will refill for 1 month. She agreed to call and let clinic know if she has any problems refilling her Victoza at the pharmacy. The importance of medication compliance was discussed at this visit. We will refill insulin pen needles for 1 month and Tammy Mooney agrees to follow up with our clinic in 2 weeks.  Hypothyroid Tammy Mooney was informed of the importance of good thyroid control to help with weight loss efforts. She was also informed that supertheraputic thyroid levels are dangerous and will not improve weight loss results. Tammy Mooney agrees to continue taking levothyroxine 88 mcg q AM #30 and we will refill for 1 month. Tammy Mooney agrees to follow up with our clinic in 2 weeks.  Obesity Tammy Mooney is currently in the action stage of change. As such, her goal is to continue with weight loss efforts She has agreed to follow the Category 3 plan Tammy Mooney has been instructed to work up to a goal  of 150 minutes of combined cardio and strengthening exercise per week for weight loss and overall health benefits. We discussed the  following Behavioral Modification Strategies today: increasing lean protein intake, decrease eating out, and increase H20 intake   Tammy Mooney has agreed to follow up with our clinic in 2 weeks. She was informed of the importance of frequent follow up visits to maximize her success with intensive lifestyle modifications for her multiple health conditions.   OBESITY BEHAVIORAL INTERVENTION VISIT  Today's visit was # 12 out of 22.  Starting weight: 260 lbs Starting date: 02/09/17 Today's weight : 247 lbs  Today's date: 08/05/2017 Total lbs lost to date: 13 (Patients must lose 7 lbs in the first 6 months to continue with counseling)   ASK: We discussed the diagnosis of obesity with Lorenza Cambridge today and Leitha agreed to give Tammy Mooney permission to discuss obesity behavioral modification therapy today.  ASSESS: Lorriann has the diagnosis of obesity and her BMI today is 39.89 Kieryn is in the action stage of change   ADVISE: Laiza was educated on the multiple health risks of obesity as well as the benefit of weight loss to improve her health. She was advised of the need for long term treatment and the importance of lifestyle modifications.  AGREE: Multiple dietary modification options and treatment options were discussed and  Lizett agreed to the above obesity treatment plan.  I, Trixie Tammy Mooney, am acting as transcriptionist for Dennard Nip, MD  I have reviewed the above documentation for accuracy and completeness, and I agree with the above. -Dennard Nip, MD

## 2017-08-19 ENCOUNTER — Ambulatory Visit (INDEPENDENT_AMBULATORY_CARE_PROVIDER_SITE_OTHER): Payer: 59 | Admitting: Family Medicine

## 2017-08-19 VITALS — BP 101/70 | HR 80 | Temp 97.8°F | Ht 66.0 in | Wt 243.0 lb

## 2017-08-19 DIAGNOSIS — E119 Type 2 diabetes mellitus without complications: Secondary | ICD-10-CM | POA: Diagnosis not present

## 2017-08-19 DIAGNOSIS — Z9189 Other specified personal risk factors, not elsewhere classified: Secondary | ICD-10-CM | POA: Diagnosis not present

## 2017-08-19 DIAGNOSIS — Z6839 Body mass index (BMI) 39.0-39.9, adult: Secondary | ICD-10-CM

## 2017-08-19 DIAGNOSIS — E559 Vitamin D deficiency, unspecified: Secondary | ICD-10-CM

## 2017-08-19 MED ORDER — VITAMIN D (ERGOCALCIFEROL) 1.25 MG (50000 UNIT) PO CAPS: 50000.0000 [IU] | ORAL_CAPSULE | ORAL | 0 refills | Status: DC

## 2017-08-19 MED ORDER — METFORMIN HCL ER 750 MG PO TB24: ORAL_TABLET | ORAL | 0 refills | Status: DC

## 2017-08-19 NOTE — Progress Notes (Signed)
Office: [phone]  /  Fax: [phone]   HPI:   Chief Complaint: OBESITY Tammy Mooney is here to discuss her progress with her obesity treatment plan. She is on the Category 3 plan and is following her eating plan approximately 70 % of the time. She states she is exercising 30 minutes 2 times per week. Tammy Mooney has a positive upper respiratory infection today. She has rhinorrhea and is currently on sudafed, which is an appetite suppressant for the patient, as she hasn't felt well to snack. Her weight is 243 lb (110.2 kg) today and has had a weight loss of 4 pounds over a period of 2 weeks since her last visit. She has lost 17 lbs since starting treatment with [name].  Vitamin D deficiency Tammy Mooney has a diagnosis of vitamin D deficiency. She is currently taking vit D and admits fatigue but denies nausea, vomiting or muscle weakness.   Ref. Range 05/12/2017 08:05  Vitamin D, 25-Hydroxy Latest Ref Range: 30.0 - 100.0 ng/mL 35.5   At risk for osteopenia and osteoporosis Tammy Mooney is at higher risk of osteopenia and osteoporosis due to vitamin D deficiency.   Diabetes II Tammy Mooney has a diagnosis of diabetes type II and she restarted victoza. Tammy Mooney states fasting BGs range in the 90's (checking every other day) and she denies any hypoglycemic episodes. She has been working on intensive lifestyle modifications including diet, exercise, and weight loss to help control her blood glucose levels.  ALLERGIES: Allergies  Allergen Reactions  . Adhesive [Tape] Rash and Other (See Comments)    Plastic tape causes rash, paper tape is okay, tegaderm is ok  . Penicillins Rash    MEDICATIONS: Current Outpatient Medications on File Prior to Visit  Medication Sig Dispense Refill  . busPIRone (BUSPAR) 5 MG tablet Take 1 tablet (5 mg total) by mouth 2 (two) times daily as needed. 60 tablet 1  . cetirizine (ZYRTEC) 10 MG tablet Take 10 mg by mouth daily.    . chlorhexidine (PERIDEX) 0.12 % solution Use as  directed 15 mLs in the mouth or throat 2 (two) times daily.    . Insulin Pen Needle 32G X 4 MM MISC 1 Package by Does not apply route every morning. 100 each 0  . levothyroxine (SYNTHROID, LEVOTHROID) 88 MCG tablet Take 1 tablet (88 mcg total) by mouth daily before breakfast. 30 tablet 0  . liraglutide (VICTOZA) 18 MG/3ML SOPN Inject 0.3 mLs (1.8 mg total) into the skin daily. 3 pen 0  . metFORMIN (GLUCOPHAGE-XR) 750 MG 24 hr tablet TAKE 2 TABLETS BY MOUTH DAILY WITH BREAKFAST. 60 tablet 0  . MIRVASO 0.33 % GEL   2  . Vitamin D, Ergocalciferol, (DRISDOL) 50000 units CAPS capsule Take 1 capsule (50,000 Units total) by mouth every 7 (seven) days. 4 capsule 0   No current facility-administered medications on file prior to visit.     PAST MEDICAL HISTORY: Past Medical History:  Diagnosis Date  . DM (diabetes mellitus), type 2 ([place])   . Hypothyroidism   . Infertility, female   . PCOS (polycystic ovarian syndrome)   . Varicella     PAST SURGICAL HISTORY: Past Surgical History:  Procedure Laterality Date  . CESAREAN SECTION    . CESAREAN SECTION  07/01/2012   Procedure: CESAREAN SECTION;  Surgeon: Lovenia Kim, MD;  Location: [facility];  Service: Obstetrics;  Laterality: N/A;  Repeat cesarean section with delivery of baby girl at 44. Apgars 9/9.  Marland Kitchen CESAREAN SECTION N/A 10/25/2014  Procedure: Repeat CESAREAN SECTION;  Surgeon: Brien Few, MD;  Location: [facility];  Service: Obstetrics;  Laterality: N/A;  EDD: 10/31/14  . EYE SURGERY     lasic  . HARDWARE REMOVAL Left 06/05/2016   Procedure: HARDWARE REMOVAL;  Surgeon: Margaretha Sheffield, MD;  Location: [facility];  Service: ENT;  Laterality: Left;  HARDWARE REMOVED FROM LEFT MAXILLA  . IMAGE GUIDED SINUS SURGERY N/A 06/05/2016   Procedure: IMAGE GUIDED SINUS SURGERY;  Surgeon: Margaretha Sheffield, MD;  Location: [facility];  Service: ENT;  Laterality: N/A;  Gave disk to cece on 11-15 kp  . MANDIBLE SURGERY    . MAXILLARY ANTROSTOMY  Right 06/05/2016   Procedure: MAXILLARY ANTROSTOMY;  Surgeon: Margaretha Sheffield, MD;  Location: [facility];  Service: ENT;  Laterality: Right;  . NASAL SINUS SURGERY    . SINUSOTOMY Left 06/05/2016   Procedure: SINUSOTOMY VIA CALDWELL LUC;  Surgeon: Margaretha Sheffield, MD;  Location: [facility];  Service: ENT;  Laterality: Left;  Diabetic -- oral meds  . TONSILLECTOMY    . TURBINATE REDUCTION Bilateral 06/05/2016   Procedure: PARTIAL TURBINATE REDUCTION;  Surgeon: Margaretha Sheffield, MD;  Location: [facility];  Service: ENT;  Laterality: Bilateral;    SOCIAL HISTORY: Social History   Tobacco Use  . Smoking status: Never Smoker  . Smokeless tobacco: Never Used  Substance Use Topics  . Alcohol use: No  . Drug use: No    FAMILY HISTORY: Family History  Problem Relation Age of Onset  . Diabetes Mother   . Depression Mother   . Sleep apnea Mother   . Obesity Mother   . Heart attack Father   . Hypertension Father   . Hyperlipidemia Father   . Heart disease Father   . Heart disease Maternal Grandfather   . Diabetes Paternal Grandmother     ROS: Review of Systems  Constitutional: Positive for malaise/fatigue and weight loss.  Gastrointestinal: Negative for nausea and vomiting.  Musculoskeletal:       Negative muscle weakness  Endo/Heme/Allergies:       Negative hypoglycemia    PHYSICAL EXAM: Blood pressure 101/70, pulse 80, temperature 97.8 F (36.6 C), temperature source Oral, height 5\' 6"  (1.676 m), weight 243 lb (110.2 kg), SpO2 98 %, unknown if currently breastfeeding. Body mass index is 39.22 kg/m. Physical Exam  Constitutional: She is oriented to person, place, and time. She appears well-developed and well-nourished.  Cardiovascular: Normal rate.  Pulmonary/Chest: Effort normal.  Musculoskeletal: Normal range of motion.  Neurological: She is oriented to person, place, and time.  Skin: Skin is warm and dry.  Psychiatric: She has a normal mood and  affect. Her behavior is normal.  Vitals reviewed.   RECENT LABS AND TESTS: BMET    Component Value Date/Time   NA 139 05/12/2017 0805   K 4.3 05/12/2017 0805   CL 103 05/12/2017 0805   CO2 19 (L) 05/12/2017 0805   GLUCOSE 84 05/12/2017 0805   GLUCOSE 104 (H) 07/09/2016 1213   BUN 15 05/12/2017 0805   CREATININE 0.72 05/12/2017 0805   CALCIUM 9.9 05/12/2017 0805   GFRNONAA 107 05/12/2017 0805   GFRAA 124 05/12/2017 0805   Lab Results  Component Value Date   HGBA1C 5.5 05/12/2017   HGBA1C 6.6 (H) 02/09/2017   HGBA1C 6.1 07/09/2016   HGBA1C 5.8 01/03/2016   HGBA1C 6.3 (H) 10/26/2014   Lab Results  Component Value Date   INSULIN 39.3 (H) 05/12/2017   INSULIN 42.1 (H)  02/09/2017   CBC    Component Value Date/Time   WBC 9.5 05/12/2017 0805   WBC 10.6 (H) 10/26/2014 0545   RBC 5.24 05/12/2017 0805   RBC 4.47 10/26/2014 0545   HGB 15.1 05/12/2017 0805   HCT 44.1 05/12/2017 0805   PLT 394 (H) 02/09/2017 1053   MCV 84 05/12/2017 0805   MCH 28.8 05/12/2017 0805   MCH 26.8 10/26/2014 0545   MCHC 34.2 05/12/2017 0805   MCHC 33.7 10/26/2014 0545   RDW 14.4 05/12/2017 0805   LYMPHSABS 2.2 05/12/2017 0805   MONOABS 0.5 05/20/2013 0925   EOSABS 0.1 05/12/2017 0805   BASOSABS 0.1 05/12/2017 0805   Iron/TIBC/Ferritin/ %Sat No results found for: IRON, TIBC, FERRITIN, IRONPCTSAT Lipid Panel     Component Value Date/Time   CHOL 170 05/12/2017 0805   TRIG 116 05/12/2017 0805   HDL 32 (L) 05/12/2017 0805   CHOLHDL 5 05/20/2013 0925   VLDL 23.4 05/20/2013 0925   LDLCALC 115 (H) 05/12/2017 0805   Hepatic Function Panel     Component Value Date/Time   PROT 7.1 05/12/2017 0805   ALBUMIN 4.3 05/12/2017 0805   AST 20 05/12/2017 0805   ALT 40 (H) 05/12/2017 0805   ALKPHOS 86 05/12/2017 0805   BILITOT 0.4 05/12/2017 0805      Component Value Date/Time   TSH 4.460 05/12/2017 0805   TSH 5.250 (H) 02/09/2017 1053   TSH 4.08 07/09/2016 1213     Ref. Range 05/12/2017  08:05  Vitamin D, 25-Hydroxy Latest Ref Range: 30.0 - 100.0 ng/mL 35.5   ASSESSMENT AND PLAN: Vitamin D deficiency - Plan: Vitamin D, Ergocalciferol, (DRISDOL) 50000 units CAPS capsule  Type 2 diabetes mellitus without complication, without long-term current use of insulin ([name]) - Plan: metFORMIN (GLUCOPHAGE-XR) 750 MG 24 hr tablet  At risk for osteoporosis  Class 2 severe obesity with serious comorbidity and body mass index (BMI) of 39.0 to 39.9 in adult, unspecified obesity type ([place])  PLAN:  Vitamin D Deficiency Tammy Mooney was informed that low vitamin D levels contributes to fatigue and are associated with obesity, breast, and colon cancer. She agrees to continue to take prescription Vit D @50 ,000 IU every week #4 with no refills and will follow up for routine testing of vitamin D, at least 2-3 times per year. She was informed of the risk of over-replacement of vitamin D and agrees to not increase her dose unless she discusses this with [name] first. Tammy Mooney agrees to follow up with our clinic in 2 weeks.  At risk for osteopenia and osteoporosis Tammy Mooney is at risk for osteopenia and osteoporosis due to her vitamin D deficiency. She was encouraged to take her vitamin D and follow her higher calcium diet and increase strengthening exercise to help strengthen her bones and decrease her risk of osteopenia and osteoporosis.  Diabetes II Tammy Mooney has been given extensive diabetes education by myself today including ideal fasting and post-prandial blood glucose readings, individual ideal Hgb A1c goals  and hypoglycemia prevention. We discussed the importance of good blood sugar control to decrease the likelihood of diabetic complications such as nephropathy, neuropathy, limb loss, blindness, coronary artery disease, and death. We discussed the importance of intensive lifestyle modification including diet, exercise and weight loss as the first line treatment for diabetes. Tammy Mooney agrees to continue  metformin 750 mg bid #60 with no refills and follow up at the agreed upon time.  Obesity Tammy Mooney is currently in the action stage of change. As such, her  goal is to continue with weight loss efforts She has agreed to keep a food journal with 1500 to 1700 calories and 90+ grams of protein daily Manjit has been instructed to work up to a goal of 150 minutes of combined cardio and strengthening exercise per week for weight loss and overall health benefits. We discussed the following Behavioral Modification Strategies today: keep a strict food journal, increasing lean protein intake and work on meal planning and easy cooking plans  Tammy Mooney has agreed to follow up with our clinic in 2 weeks. She was informed of the importance of frequent follow up visits to maximize her success with intensive lifestyle modifications for her multiple health conditions.   OBESITY BEHAVIORAL INTERVENTION VISIT  Today's visit was # 13 out of 22.  Starting weight: 260 lbs Starting date: 02/09/17 Today's weight : 243 lbs  Today's date: 08/19/2017 Total lbs lost to date: 17 (Patients must lose 7 lbs in the first 6 months to continue with counseling)   ASK: We discussed the diagnosis of obesity with Tammy Mooney today and Tammy Mooney agreed to give [name] permission to discuss obesity behavioral modification therapy today.  ASSESS: Tammy Mooney has the diagnosis of obesity and her BMI today is 39.24 Tammy Mooney is in the action stage of change   ADVISE: Tammy Mooney was educated on the multiple health risks of obesity as well as the benefit of weight loss to improve her health. She was advised of the need for long term treatment and the importance of lifestyle modifications.  AGREE: Multiple dietary modification options and treatment options were discussed and  Tammy Mooney agreed to the above obesity treatment plan.  I, Doreene Nest, am acting as transcriptionist for Dennard Nip, MD  I have reviewed the above documentation  for accuracy and completeness, and I agree with the above. -Dennard Nip, MD

## 2017-09-03 ENCOUNTER — Ambulatory Visit (INDEPENDENT_AMBULATORY_CARE_PROVIDER_SITE_OTHER): Payer: 59 | Admitting: Family Medicine

## 2017-09-03 VITALS — BP 111/75 | HR 87 | Temp 97.8°F | Ht 66.0 in | Wt 244.0 lb

## 2017-09-03 DIAGNOSIS — Z9189 Other specified personal risk factors, not elsewhere classified: Secondary | ICD-10-CM

## 2017-09-03 DIAGNOSIS — E119 Type 2 diabetes mellitus without complications: Secondary | ICD-10-CM | POA: Diagnosis not present

## 2017-09-03 DIAGNOSIS — E559 Vitamin D deficiency, unspecified: Secondary | ICD-10-CM | POA: Diagnosis not present

## 2017-09-03 DIAGNOSIS — Z6839 Body mass index (BMI) 39.0-39.9, adult: Secondary | ICD-10-CM | POA: Diagnosis not present

## 2017-09-03 MED ORDER — LIRAGLUTIDE 18 MG/3ML ~~LOC~~ SOPN: 1.8000 mg | PEN_INJECTOR | Freq: Every day | SUBCUTANEOUS | 0 refills | Status: DC

## 2017-09-03 NOTE — Progress Notes (Signed)
Office: [phone]  /  Fax: [phone]   HPI:   Chief Complaint: OBESITY Tammy Mooney is here to discuss her progress with her obesity treatment plan. She is on the keep a food journal with 1500 to 1700 calories and 90+ grams of protein daily and is following her eating plan approximately 70 % of the time. She states she is walking 20 minutes 3 times per week. Tammy Mooney is slightly frustrated because she felt a majority of her 2 weeks, she was "spot on" in getting her protein intake and journaling. Tammy Mooney feels her clothes are fitting looser. Tammy Mooney is occassionally skipping meals on the weekend. Her weight is 244 lb (110.7 kg) today and has had a weight gain of 1 pound over a period of 2 weeks since her last visit. She has lost 16 lbs since starting treatment with [name].  Diabetes II Tammy Mooney has a diagnosis of diabetes type II. Tammy Mooney denies any hypoglycemic episodes, hyperphagia, polyuria or polydipsia. She has been working on intensive lifestyle modifications including diet, exercise, and weight loss to help control her blood glucose levels. Tammy Mooney hasn't picked up her metformin from the last visit yet.  Vitamin D deficiency Tammy Mooney has a diagnosis of vitamin D deficiency. She hasn't picked up her vitamin D yet, and hasn't taken vitamin D for over a week. Tammy Mooney denies nausea, vomiting or muscle weakness.   Ref. Range 05/12/2017 08:05  Vitamin D, 25-Hydroxy Latest Ref Range: 30.0 - 100.0 ng/mL 35.5   At risk for osteopenia and osteoporosis Tammy Mooney is at higher risk of osteopenia and osteoporosis due to vitamin D deficiency.   ALLERGIES: Allergies  Allergen Reactions  . Adhesive [Tape] Rash and Other (See Comments)    Plastic tape causes rash, paper tape is okay, tegaderm is ok  . Penicillins Rash    MEDICATIONS: Current Outpatient Medications on File Prior to Visit  Medication Sig Dispense Refill  . busPIRone (BUSPAR) 5 MG tablet Take 1 tablet (5 mg total) by mouth 2 (two)  times daily as needed. 60 tablet 1  . cetirizine (ZYRTEC) 10 MG tablet Take 10 mg by mouth daily.    . chlorhexidine (PERIDEX) 0.12 % solution Use as directed 15 mLs in the mouth or throat 2 (two) times daily.    . Insulin Pen Needle 32G X 4 MM MISC 1 Package by Does not apply route every morning. 100 each 0  . levothyroxine (SYNTHROID, LEVOTHROID) 88 MCG tablet Take 1 tablet (88 mcg total) by mouth daily before breakfast. 30 tablet 0  . liraglutide (VICTOZA) 18 MG/3ML SOPN Inject 0.3 mLs (1.8 mg total) into the skin daily. 3 pen 0  . metFORMIN (GLUCOPHAGE-XR) 750 MG 24 hr tablet TAKE 2 TABLETS BY MOUTH DAILY WITH BREAKFAST. 60 tablet 0  . MIRVASO 0.33 % GEL   2  . Vitamin D, Ergocalciferol, (DRISDOL) 50000 units CAPS capsule Take 1 capsule (50,000 Units total) by mouth every 7 (seven) days. 4 capsule 0   No current facility-administered medications on file prior to visit.     PAST MEDICAL HISTORY: Past Medical History:  Diagnosis Date  . DM (diabetes mellitus), type 2 ([place])   . Hypothyroidism   . Infertility, female   . PCOS (polycystic ovarian syndrome)   . Varicella     PAST SURGICAL HISTORY: Past Surgical History:  Procedure Laterality Date  . CESAREAN SECTION    . CESAREAN SECTION  07/01/2012   Procedure: CESAREAN SECTION;  Surgeon: Lovenia Kim, MD;  Location: [facility];  Service: Obstetrics;  Laterality: N/A;  Repeat cesarean section with delivery of baby girl at 40. Apgars 9/9.  Marland Kitchen CESAREAN SECTION N/A 10/25/2014   Procedure: Repeat CESAREAN SECTION;  Surgeon: Brien Few, MD;  Location: [facility];  Service: Obstetrics;  Laterality: N/A;  EDD: 10/31/14  . EYE SURGERY     lasic  . HARDWARE REMOVAL Left 06/05/2016   Procedure: HARDWARE REMOVAL;  Surgeon: Margaretha Sheffield, MD;  Location: [facility];  Service: ENT;  Laterality: Left;  HARDWARE REMOVED FROM LEFT MAXILLA  . IMAGE GUIDED SINUS SURGERY N/A 06/05/2016   Procedure: IMAGE GUIDED SINUS SURGERY;  Surgeon: Margaretha Sheffield, MD;  Location: [facility];  Service: ENT;  Laterality: N/A;  Gave disk to cece on 11-15 kp  . MANDIBLE SURGERY    . MAXILLARY ANTROSTOMY Right 06/05/2016   Procedure: MAXILLARY ANTROSTOMY;  Surgeon: Margaretha Sheffield, MD;  Location: [facility];  Service: ENT;  Laterality: Right;  . NASAL SINUS SURGERY    . SINUSOTOMY Left 06/05/2016   Procedure: SINUSOTOMY VIA CALDWELL LUC;  Surgeon: Margaretha Sheffield, MD;  Location: [facility];  Service: ENT;  Laterality: Left;  Diabetic -- oral meds  . TONSILLECTOMY    . TURBINATE REDUCTION Bilateral 06/05/2016   Procedure: PARTIAL TURBINATE REDUCTION;  Surgeon: Margaretha Sheffield, MD;  Location: [facility];  Service: ENT;  Laterality: Bilateral;    SOCIAL HISTORY: Social History   Tobacco Use  . Smoking status: Never Smoker  . Smokeless tobacco: Never Used  Substance Use Topics  . Alcohol use: No  . Drug use: No    FAMILY HISTORY: Family History  Problem Relation Age of Onset  . Diabetes Mother   . Depression Mother   . Sleep apnea Mother   . Obesity Mother   . Heart attack Father   . Hypertension Father   . Hyperlipidemia Father   . Heart disease Father   . Heart disease Maternal Grandfather   . Diabetes Paternal Grandmother     ROS: Review of Systems  Constitutional: Negative for weight loss.  Gastrointestinal: Negative for nausea and vomiting.  Genitourinary: Negative for frequency.  Musculoskeletal:       Negative for muscle weakness  Endo/Heme/Allergies: Negative for polydipsia.       Negative for hyperphagia Negative for hypoglycemia     PHYSICAL EXAM: Blood pressure 111/75, pulse 87, temperature 97.8 F (36.6 C), temperature source Oral, height 5\' 6"  (1.676 m), weight 244 lb (110.7 kg), SpO2 97 %, unknown if currently breastfeeding. Body mass index is 39.38 kg/m. Physical Exam  Constitutional: She is oriented to person, place, and time. She appears well-developed and well-nourished.    Cardiovascular: Normal rate.  Pulmonary/Chest: Effort normal.  Musculoskeletal: Normal range of motion.  Neurological: She is oriented to person, place, and time.  Skin: Skin is warm and dry.  Psychiatric: She has a normal mood and affect. Her behavior is normal.  Vitals reviewed.   RECENT LABS AND TESTS: BMET    Component Value Date/Time   NA 139 05/12/2017 0805   K 4.3 05/12/2017 0805   CL 103 05/12/2017 0805   CO2 19 (L) 05/12/2017 0805   GLUCOSE 84 05/12/2017 0805   GLUCOSE 104 (H) 07/09/2016 1213   BUN 15 05/12/2017 0805   CREATININE 0.72 05/12/2017 0805   CALCIUM 9.9 05/12/2017 0805   GFRNONAA 107 05/12/2017 0805   GFRAA 124 05/12/2017 0805   Lab Results  Component Value Date   HGBA1C 5.5 05/12/2017  HGBA1C 6.6 (H) 02/09/2017   HGBA1C 6.1 07/09/2016   HGBA1C 5.8 01/03/2016   HGBA1C 6.3 (H) 10/26/2014   Lab Results  Component Value Date   INSULIN 39.3 (H) 05/12/2017   INSULIN 42.1 (H) 02/09/2017   CBC    Component Value Date/Time   WBC 9.5 05/12/2017 0805   WBC 10.6 (H) 10/26/2014 0545   RBC 5.24 05/12/2017 0805   RBC 4.47 10/26/2014 0545   HGB 15.1 05/12/2017 0805   HCT 44.1 05/12/2017 0805   PLT 394 (H) 02/09/2017 1053   MCV 84 05/12/2017 0805   MCH 28.8 05/12/2017 0805   MCH 26.8 10/26/2014 0545   MCHC 34.2 05/12/2017 0805   MCHC 33.7 10/26/2014 0545   RDW 14.4 05/12/2017 0805   LYMPHSABS 2.2 05/12/2017 0805   MONOABS 0.5 05/20/2013 0925   EOSABS 0.1 05/12/2017 0805   BASOSABS 0.1 05/12/2017 0805   Iron/TIBC/Ferritin/ %Sat No results found for: IRON, TIBC, FERRITIN, IRONPCTSAT Lipid Panel     Component Value Date/Time   CHOL 170 05/12/2017 0805   TRIG 116 05/12/2017 0805   HDL 32 (L) 05/12/2017 0805   CHOLHDL 5 05/20/2013 0925   VLDL 23.4 05/20/2013 0925   LDLCALC 115 (H) 05/12/2017 0805   Hepatic Function Panel     Component Value Date/Time   PROT 7.1 05/12/2017 0805   ALBUMIN 4.3 05/12/2017 0805   AST 20 05/12/2017 0805   ALT  40 (H) 05/12/2017 0805   ALKPHOS 86 05/12/2017 0805   BILITOT 0.4 05/12/2017 0805      Component Value Date/Time   TSH 4.460 05/12/2017 0805   TSH 5.250 (H) 02/09/2017 1053   TSH 4.08 07/09/2016 1213    ASSESSMENT AND PLAN: Type 2 diabetes mellitus without complication, without long-term current use of insulin ([name]) - Plan: CBC With Differential, Comprehensive metabolic panel, Hemoglobin A1c, Insulin, random, Lipid Panel With LDL/HDL Ratio, T3, T4, free, TSH, liraglutide (VICTOZA) 18 MG/3ML SOPN  Vitamin D deficiency - Plan: VITAMIN D 25 Hydroxy (Vit-D Deficiency, Fractures)  At risk for osteoporosis  Class 2 severe obesity with serious comorbidity and body mass index (BMI) of 39.0 to 39.9 in adult, unspecified obesity type ([place])  PLAN:  Diabetes II Rhealyn has been given extensive diabetes education by myself today including ideal fasting and post-prandial blood glucose readings, individual ideal Hgb A1c goals  and hypoglycemia prevention. We discussed the importance of good blood sugar control to decrease the likelihood of diabetic complications such as nephropathy, neuropathy, limb loss, blindness, coronary artery disease, and death. We discussed the importance of intensive lifestyle modification including diet, exercise and weight loss as the first line treatment for diabetes. We will check labs. Tammy Mooney agrees to continue Victoza 1.8 mg qd #3 pens with no refills and follow up at the agreed upon time.  Vitamin D Deficiency Tammy Mooney was informed that low vitamin D levels contributes to fatigue and are associated with obesity, breast, and colon cancer. She agrees to continue to take prescription Vit D @50 ,000 IU every week. We will check labs and will follow up for routine testing of vitamin D, at least 2-3 times per year. She was informed of the risk of over-replacement of vitamin D and agrees to not increase her dose unless she discusses this with [name] first.  At risk for osteopenia and  osteoporosis Tammy Mooney is at risk for osteopenia and osteoporosis due to her vitamin D deficiency. She was encouraged to take her vitamin D and follow her higher calcium diet and  increase strengthening exercise to help strengthen her bones and decrease her risk of osteopenia and osteoporosis.  Obesity Tammy Mooney is currently in the action stage of change. As such, her goal is to continue with weight loss efforts She has agreed to keep a food journal with 1500 to 1700 calories and 90 grams of protein daily Tammy Mooney has been instructed to work up to a goal of 150 minutes of combined cardio and strengthening exercise per week for weight loss and overall health benefits. We discussed the following Behavioral Modification Strategies today: no skipping meals, keep a strict food journal, increasing lean protein intake and work on meal planning and easy cooking plans  Tammy Mooney has agreed to follow up with our clinic in 2 weeks. She was informed of the importance of frequent follow up visits to maximize her success with intensive lifestyle modifications for her multiple health conditions.   OBESITY BEHAVIORAL INTERVENTION VISIT  Today's visit was # 14 out of 22.  Starting weight: 260 lbs Starting date: 02/09/17 Today's weight : 244 lbs Today's date: 09/03/2017 Total lbs lost to date: 16 (Patients must lose 7 lbs in the first 6 months to continue with counseling)   ASK: We discussed the diagnosis of obesity with Tammy Mooney today and Tammy Mooney agreed to give [name] permission to discuss obesity behavioral modification therapy today.  ASSESS: Tammy Mooney has the diagnosis of obesity and her BMI today is 39.4 Tammy Mooney is in the action stage of change   ADVISE: Tammy Mooney was educated on the multiple health risks of obesity as well as the benefit of weight loss to improve her health. She was advised of the need for long term treatment and the importance of lifestyle modifications.  AGREE: Multiple dietary  modification options and treatment options were discussed and  Gunhild agreed to the above obesity treatment plan.  I, Doreene Nest, am acting as transcriptionist for Eber Jones, MD  I have reviewed the above documentation for accuracy and completeness, and I agree with the above. - Ilene Qua, MD

## 2017-09-04 LAB — T3: T3, Total: 136 ng/dL (ref 71–180)

## 2017-09-04 LAB — COMPREHENSIVE METABOLIC PANEL
ALK PHOS: 78 IU/L (ref 39–117)
ALT: 55 IU/L — AB (ref 0–32)
AST: 21 IU/L (ref 0–40)
Albumin/Globulin Ratio: 1.5 (ref 1.2–2.2)
Albumin: 4.4 g/dL (ref 3.5–5.5)
BILIRUBIN TOTAL: 0.4 mg/dL (ref 0.0–1.2)
BUN/Creatinine Ratio: 20 (ref 9–23)
BUN: 14 mg/dL (ref 6–20)
CHLORIDE: 101 mmol/L (ref 96–106)
CO2: 19 mmol/L — ABNORMAL LOW (ref 20–29)
Calcium: 9.9 mg/dL (ref 8.7–10.2)
Creatinine, Ser: 0.7 mg/dL (ref 0.57–1.00)
GFR calc non Af Amer: 111 mL/min/{1.73_m2} (ref >59)
GFR, EST AFRICAN AMERICAN: 128 mL/min/{1.73_m2} (ref >59)
GLUCOSE: 79 mg/dL (ref 65–99)
Globulin, Total: 2.9 g/dL (ref 1.5–4.5)
POTASSIUM: 4.2 mmol/L (ref 3.5–5.2)
Sodium: 139 mmol/L (ref 134–144)
TOTAL PROTEIN: 7.3 g/dL (ref 6.0–8.5)

## 2017-09-04 LAB — CBC WITH DIFFERENTIAL
BASOS ABS: 0 10*3/uL (ref 0.0–0.2)
Basos: 0 %
EOS (ABSOLUTE): 0.1 10*3/uL (ref 0.0–0.4)
Eos: 1 %
Hematocrit: 43.1 % (ref 34.0–46.6)
Hemoglobin: 14.8 g/dL (ref 11.1–15.9)
IMMATURE GRANS (ABS): 0 10*3/uL (ref 0.0–0.1)
Immature Granulocytes: 0 %
LYMPHS: 22 %
Lymphocytes Absolute: 2.1 10*3/uL (ref 0.7–3.1)
MCH: 28.9 pg (ref 26.6–33.0)
MCHC: 34.3 g/dL (ref 31.5–35.7)
MCV: 84 fL (ref 79–97)
MONOS ABS: 0.5 10*3/uL (ref 0.1–0.9)
Monocytes: 5 %
NEUTROS ABS: 7.1 10*3/uL — AB (ref 1.4–7.0)
NEUTROS PCT: 72 %
RBC: 5.12 x10E6/uL (ref 3.77–5.28)
RDW: 14.1 % (ref 12.3–15.4)
WBC: 9.9 10*3/uL (ref 3.4–10.8)

## 2017-09-04 LAB — LIPID PANEL WITH LDL/HDL RATIO
CHOLESTEROL TOTAL: 169 mg/dL (ref 100–199)
HDL: 37 mg/dL — ABNORMAL LOW (ref >39)
LDL Calculated: 110 mg/dL — ABNORMAL HIGH (ref 0–99)
LDl/HDL Ratio: 3 ratio (ref 0.0–3.2)
Triglycerides: 112 mg/dL (ref 0–149)
VLDL CHOLESTEROL CAL: 22 mg/dL (ref 5–40)

## 2017-09-04 LAB — VITAMIN D 25 HYDROXY (VIT D DEFICIENCY, FRACTURES): VIT D 25 HYDROXY: 32.6 ng/mL (ref 30.0–100.0)

## 2017-09-04 LAB — TSH: TSH: 3.91 u[IU]/mL (ref 0.450–4.500)

## 2017-09-04 LAB — HEMOGLOBIN A1C
ESTIMATED AVERAGE GLUCOSE: 114 mg/dL
HEMOGLOBIN A1C: 5.6 % (ref 4.8–5.6)

## 2017-09-04 LAB — INSULIN, RANDOM: INSULIN: 40.1 u[IU]/mL — AB (ref 2.6–24.9)

## 2017-09-04 LAB — T4, FREE: Free T4: 1.68 ng/dL (ref 0.82–1.77)

## 2017-09-14 ENCOUNTER — Other Ambulatory Visit (INDEPENDENT_AMBULATORY_CARE_PROVIDER_SITE_OTHER): Payer: Self-pay | Admitting: Family Medicine

## 2017-09-14 DIAGNOSIS — E038 Other specified hypothyroidism: Secondary | ICD-10-CM

## 2017-09-14 MED FILL — METFORMIN HCL ER 750 MG TAB: 750 | 30 days supply | Qty: 60 | Fill #0

## 2017-09-14 MED FILL — VIT D2 1.25 MG (50,000 UNIT: 1.25 MG | 28 days supply | Qty: 4 | Fill #0

## 2017-09-14 MED FILL — VICTOZA 18 MG/3 ML INJECT P: 18 | 30 days supply | Qty: 9 | Fill #0

## 2017-09-17 ENCOUNTER — Ambulatory Visit (INDEPENDENT_AMBULATORY_CARE_PROVIDER_SITE_OTHER): Payer: 59 | Admitting: Family Medicine

## 2017-09-17 VITALS — BP 117/76 | HR 88 | Temp 97.5°F | Ht 66.0 in | Wt 243.0 lb

## 2017-09-17 DIAGNOSIS — Z9189 Other specified personal risk factors, not elsewhere classified: Secondary | ICD-10-CM | POA: Diagnosis not present

## 2017-09-17 DIAGNOSIS — G4709 Other insomnia: Secondary | ICD-10-CM | POA: Diagnosis not present

## 2017-09-17 DIAGNOSIS — E038 Other specified hypothyroidism: Secondary | ICD-10-CM | POA: Diagnosis not present

## 2017-09-17 DIAGNOSIS — F5101 Primary insomnia: Secondary | ICD-10-CM | POA: Insufficient documentation

## 2017-09-17 DIAGNOSIS — Z6839 Body mass index (BMI) 39.0-39.9, adult: Secondary | ICD-10-CM | POA: Diagnosis not present

## 2017-09-17 DIAGNOSIS — E119 Type 2 diabetes mellitus without complications: Secondary | ICD-10-CM | POA: Diagnosis not present

## 2017-09-17 MED ORDER — LEVOTHYROXINE SODIUM 88 MCG PO TABS
88.0000 ug | ORAL_TABLET | Freq: Every day | ORAL | 0 refills | Status: DC
Start: 2017-09-17 — End: 2017-11-04

## 2017-09-17 MED FILL — LEVOTHYROXINE 88 MCG TABLET: 88 | 30 days supply | Qty: 30 | Fill #0

## 2017-09-18 NOTE — Progress Notes (Signed)
Office: [phone]  /  Fax: [phone]   HPI:   Chief Complaint: OBESITY Tammy Mooney is here to discuss her progress with her obesity treatment plan. She is on the Category 3 plan and is following her eating plan approximately 65 % of the time. She states she is walking for 40 minutes 5 times per week. Tammy Mooney continues to do well with weight loss but struggles with motivation. She has done well losing weight slowly but steadily over the last year.  Her weight is 243 lb (110.2 kg) today and has had a weight loss of 1 pound over a period of 2 weeks since her last visit. She has lost 17 lbs since starting treatment with [name].  Insomnia Tammy Mooney has difficulty falling asleep, helps when she takes Tylenol PM. She has some mild daytime somnolence and increase stress at work.   Diabetes II Tammy Mooney has a diagnosis of diabetes type II. Tammy Mooney states fasting BGs range between 95 and 105, had 1 possible episode of hypoglycemia. Last A1c was 5.6 on 09/03/17. She has been working on intensive lifestyle modifications including diet, exercise, and weight loss to help control her blood glucose levels.  Hypothyroid Tammy Mooney has a diagnosis of hypothyroidism. Labs are stable and fatigue is stable. She is on Synthroid. She denies hot or cold intolerance or palpitations.  At risk for cardiovascular disease Tammy Mooney is at a higher than average risk for cardiovascular disease due to obesity. She currently denies any chest pain.  ALLERGIES: Allergies  Allergen Reactions  . Adhesive [Tape] Rash and Other (See Comments)    Plastic tape causes rash, paper tape is okay, tegaderm is ok  . Penicillins Rash    MEDICATIONS: Current Outpatient Medications on File Prior to Visit  Medication Sig Dispense Refill  . busPIRone (BUSPAR) 5 MG tablet Take 1 tablet (5 mg total) by mouth 2 (two) times daily as needed. 60 tablet 1  . cetirizine (ZYRTEC) 10 MG tablet Take 10 mg by mouth daily.    . chlorhexidine (PERIDEX)  0.12 % solution Use as directed 15 mLs in the mouth or throat 2 (two) times daily.    . Insulin Pen Needle 32G X 4 MM MISC 1 Package by Does not apply route every morning. 100 each 0  . liraglutide (VICTOZA) 18 MG/3ML SOPN Inject 0.3 mLs (1.8 mg total) into the skin daily. 3 pen 0  . metFORMIN (GLUCOPHAGE-XR) 750 MG 24 hr tablet TAKE 2 TABLETS BY MOUTH DAILY WITH BREAKFAST. 60 tablet 0  . MIRVASO 0.33 % GEL   2  . Vitamin D, Ergocalciferol, (DRISDOL) 50000 units CAPS capsule Take 1 capsule (50,000 Units total) by mouth every 7 (seven) days. 4 capsule 0   No current facility-administered medications on file prior to visit.     PAST MEDICAL HISTORY: Past Medical History:  Diagnosis Date  . DM (diabetes mellitus), type 2 ([place])   . Hypothyroidism   . Infertility, female   . PCOS (polycystic ovarian syndrome)   . Varicella     PAST SURGICAL HISTORY: Past Surgical History:  Procedure Laterality Date  . CESAREAN SECTION    . CESAREAN SECTION  07/01/2012   Procedure: CESAREAN SECTION;  Surgeon: Lovenia Kim, MD;  Location: [facility];  Service: Obstetrics;  Laterality: N/A;  Repeat cesarean section with delivery of baby girl at 21. Apgars 9/9.  Marland Kitchen CESAREAN SECTION N/A 10/25/2014   Procedure: Repeat CESAREAN SECTION;  Surgeon: Brien Few, MD;  Location: [facility];  Service: Obstetrics;  Laterality:  N/A;  EDD: 10/31/14  . EYE SURGERY     lasic  . HARDWARE REMOVAL Left 06/05/2016   Procedure: HARDWARE REMOVAL;  Surgeon: Margaretha Sheffield, MD;  Location: [facility];  Service: ENT;  Laterality: Left;  HARDWARE REMOVED FROM LEFT MAXILLA  . IMAGE GUIDED SINUS SURGERY N/A 06/05/2016   Procedure: IMAGE GUIDED SINUS SURGERY;  Surgeon: Margaretha Sheffield, MD;  Location: [facility];  Service: ENT;  Laterality: N/A;  Gave disk to cece on 11-15 kp  . MANDIBLE SURGERY    . MAXILLARY ANTROSTOMY Right 06/05/2016   Procedure: MAXILLARY ANTROSTOMY;  Surgeon: Margaretha Sheffield, MD;  Location: [facility];  Service: ENT;  Laterality: Right;  . NASAL SINUS SURGERY    . SINUSOTOMY Left 06/05/2016   Procedure: SINUSOTOMY VIA CALDWELL LUC;  Surgeon: Margaretha Sheffield, MD;  Location: [facility];  Service: ENT;  Laterality: Left;  Diabetic -- oral meds  . TONSILLECTOMY    . TURBINATE REDUCTION Bilateral 06/05/2016   Procedure: PARTIAL TURBINATE REDUCTION;  Surgeon: Margaretha Sheffield, MD;  Location: [facility];  Service: ENT;  Laterality: Bilateral;    SOCIAL HISTORY: Social History   Tobacco Use  . Smoking status: Never Smoker  . Smokeless tobacco: Never Used  Substance Use Topics  . Alcohol use: No  . Drug use: No    FAMILY HISTORY: Family History  Problem Relation Age of Onset  . Diabetes Mother   . Depression Mother   . Sleep apnea Mother   . Obesity Mother   . Heart attack Father   . Hypertension Father   . Hyperlipidemia Father   . Heart disease Father   . Heart disease Maternal Grandfather   . Diabetes Paternal Grandmother     ROS: Review of Systems  Constitutional: Positive for weight loss. Negative for malaise/fatigue.       Negative hot/cold intolerance  Cardiovascular: Negative for chest pain and palpitations.  Endo/Heme/Allergies:       Positive hypoglycemia  Psychiatric/Behavioral: The patient has insomnia.     PHYSICAL EXAM: Blood pressure 117/76, pulse 88, temperature (!) 97.5 F (36.4 C), temperature source Oral, height 5\' 6"  (1.676 m), weight 243 lb (110.2 kg), SpO2 98 %, unknown if currently breastfeeding. Body mass index is 39.22 kg/m. Physical Exam  Constitutional: She is oriented to person, place, and time. She appears well-developed and well-nourished.  Cardiovascular: Normal rate.  Pulmonary/Chest: Effort normal.  Musculoskeletal: Normal range of motion.  Neurological: She is oriented to person, place, and time.  Skin: Skin is warm and dry.  Psychiatric: She has a normal mood and affect. Her behavior is normal.  Vitals  reviewed.   RECENT LABS AND TESTS: BMET    Component Value Date/Time   NA 139 09/03/2017 0848   K 4.2 09/03/2017 0848   CL 101 09/03/2017 0848   CO2 19 (L) 09/03/2017 0848   GLUCOSE 79 09/03/2017 0848   GLUCOSE 104 (H) 07/09/2016 1213   BUN 14 09/03/2017 0848   CREATININE 0.70 09/03/2017 0848   CALCIUM 9.9 09/03/2017 0848   GFRNONAA 111 09/03/2017 0848   GFRAA 128 09/03/2017 0848   Lab Results  Component Value Date   HGBA1C 5.6 09/03/2017   HGBA1C 5.5 05/12/2017   HGBA1C 6.6 (H) 02/09/2017   HGBA1C 6.1 07/09/2016   HGBA1C 5.8 01/03/2016   Lab Results  Component Value Date   INSULIN 40.1 (H) 09/03/2017   INSULIN 39.3 (H) 05/12/2017   INSULIN 42.1 (H) 02/09/2017   CBC  Component Value Date/Time   WBC 9.9 09/03/2017 0848   WBC 10.6 (H) 10/26/2014 0545   RBC 5.12 09/03/2017 0848   RBC 4.47 10/26/2014 0545   HGB 14.8 09/03/2017 0848   HCT 43.1 09/03/2017 0848   PLT 394 (H) 02/09/2017 1053   MCV 84 09/03/2017 0848   MCH 28.9 09/03/2017 0848   MCH 26.8 10/26/2014 0545   MCHC 34.3 09/03/2017 0848   MCHC 33.7 10/26/2014 0545   RDW 14.1 09/03/2017 0848   LYMPHSABS 2.1 09/03/2017 0848   MONOABS 0.5 05/20/2013 0925   EOSABS 0.1 09/03/2017 0848   BASOSABS 0.0 09/03/2017 0848   Iron/TIBC/Ferritin/ %Sat No results found for: IRON, TIBC, FERRITIN, IRONPCTSAT Lipid Panel     Component Value Date/Time   CHOL 169 09/03/2017 0848   TRIG 112 09/03/2017 0848   HDL 37 (L) 09/03/2017 0848   CHOLHDL 5 05/20/2013 0925   VLDL 23.4 05/20/2013 0925   LDLCALC 110 (H) 09/03/2017 0848   Hepatic Function Panel     Component Value Date/Time   PROT 7.3 09/03/2017 0848   ALBUMIN 4.4 09/03/2017 0848   AST 21 09/03/2017 0848   ALT 55 (H) 09/03/2017 0848   ALKPHOS 78 09/03/2017 0848   BILITOT 0.4 09/03/2017 0848      Component Value Date/Time   TSH 3.910 09/03/2017 0848   TSH 4.460 05/12/2017 0805   TSH 5.250 (H) 02/09/2017 1053    ASSESSMENT AND PLAN: Other specified  hypothyroidism - Plan: levothyroxine (SYNTHROID, LEVOTHROID) 88 MCG tablet  Other insomnia  Type 2 diabetes mellitus without complication, without long-term current use of insulin ([name])  At risk for heart disease  Class 2 severe obesity with serious comorbidity and body mass index (BMI) of 39.0 to 39.9 in adult, unspecified obesity type ([place])  PLAN:  Insomnia Tammy Mooney was advised it is ok to take OTC Tylenol PM as needed. Tammy Mooney agrees to follow up with our clinic in 2 to 3 weeks.  Diabetes II Tammy Mooney has been given extensive diabetes education by myself today including ideal fasting and post-prandial blood glucose readings, individual ideal Hgb A1c goals and hypoglycemia prevention. We discussed the importance of good blood sugar control to decrease the likelihood of diabetic complications such as nephropathy, neuropathy, limb loss, blindness, coronary artery disease, and death. We discussed the importance of intensive lifestyle modification including diet, exercise and weight loss as the first line treatment for diabetes. Kanyah agrees to continue taking metformin and liraglutide and she agrees to follow up with our clinic in 2 to 3 weeks.  Hypothyroid Tammy Mooney was informed of the importance of good thyroid control to help with weight loss efforts. She was also informed that supertheraputic thyroid levels are dangerous and will not improve weight loss results. Tammy Mooney agrees to continue taking Synthroid 88 mcg q AM #30 and we will refill for 1 month. Tammy Mooney agrees to follow up with our clinic in 2 to 3 weeks.  Cardiovascular risk counselling Tammy Mooney was given extended (15 minutes) coronary artery disease prevention counseling today. She is 37 y.o. female and has risk factors for heart disease including obesity. We discussed intensive lifestyle modifications today with an emphasis on specific weight loss instructions and strategies. Pt was also informed of the importance of increasing  exercise and decreasing saturated fats to help prevent heart disease.  Obesity Tammy Mooney is currently in the action stage of change. As such, her goal is to continue with weight loss efforts She has agreed to keep a food journal with 1500-1700  calories and 90+ grams of protein or follow the Category 3 plan Tammy Mooney has been instructed to work up to a goal of 150 minutes of combined cardio and strengthening exercise per week for weight loss and overall health benefits. We discussed the following Behavioral Modification Strategies today: increasing lean protein intake, emotional eating strategies, and keep a strict food journal We discussed ways to increase motivation to make changes.  Tammy Mooney has agreed to follow up with our clinic in 2 to 3 weeks. She was informed of the importance of frequent follow up visits to maximize her success with intensive lifestyle modifications for her multiple health conditions.   OBESITY BEHAVIORAL INTERVENTION VISIT  Today's visit was # 15 out of 22.  Starting weight: 260 lbs Starting date: 02/09/17 Today's weight : 243 lbs Today's date: 09/17/2017 Total lbs lost to date: 17 (Patients must lose 7 lbs in the first 6 months to continue with counseling)   ASK: We discussed the diagnosis of obesity with Tammy Mooney today and Tammy Mooney agreed to give [name] permission to discuss obesity behavioral modification therapy today.  ASSESS: Tammy Mooney has the diagnosis of obesity and her BMI today is 39.24 Tammy Mooney is in the action stage of change   ADVISE: Tammy Mooney was educated on the multiple health risks of obesity as well as the benefit of weight loss to improve her health. She was advised of the need for long term treatment and the importance of lifestyle modifications.  AGREE: Multiple dietary modification options and treatment options were discussed and  Tammy Mooney agreed to the above obesity treatment plan.  I, Tammy Mooney, am acting as transcriptionist for  Dennard Nip, MD  I have reviewed the above documentation for accuracy and completeness, and I agree with the above. -Dennard Nip, MD

## 2017-09-29 ENCOUNTER — Ambulatory Visit (INDEPENDENT_AMBULATORY_CARE_PROVIDER_SITE_OTHER): Payer: 59 | Admitting: Family Medicine

## 2017-09-29 VITALS — BP 105/68 | HR 87 | Temp 97.6°F | Ht 66.0 in | Wt 245.0 lb

## 2017-09-29 DIAGNOSIS — Z9189 Other specified personal risk factors, not elsewhere classified: Secondary | ICD-10-CM

## 2017-09-29 DIAGNOSIS — E559 Vitamin D deficiency, unspecified: Secondary | ICD-10-CM

## 2017-09-29 DIAGNOSIS — Z6839 Body mass index (BMI) 39.0-39.9, adult: Secondary | ICD-10-CM

## 2017-09-29 MED ORDER — VITAMIN D (ERGOCALCIFEROL) 1.25 MG (50000 UNIT) PO CAPS: 50000.0000 [IU] | ORAL_CAPSULE | ORAL | 0 refills | Status: DC

## 2017-09-29 NOTE — Progress Notes (Signed)
Office: [phone]  /  Fax: [phone]   HPI:   Chief Complaint: OBESITY Tammy Mooney is here to discuss her progress with her obesity treatment plan. She is on the keep a food journal with 1500-1700 calories and 90+ grams of protein or follow the Category 3 plan and is following her eating plan approximately 30 % of the time. She states she is walking for 40 minutes 2-3 times per week. Tammy Mooney has gotten off track, she has been mindful but not planning ahead as much. She is getting ready to go on vacation on a cruise and worried about holiday weight gain.  Her weight is 245 lb (111.1 kg) today and has gained 2 pounds since her last visit. She has lost 15 lbs since starting treatment with [name].  Vitamin D Deficiency Tammy Mooney has a diagnosis of vitamin D deficiency. She is stable on Vit D prescription, not yet at goal. She denies nausea, vomiting or muscle weakness.  At risk for osteopenia and osteoporosis Tammy Mooney is at higher risk of osteopenia and osteoporosis due to vitamin D deficiency.   ALLERGIES: Allergies  Allergen Reactions  . Adhesive [Tape] Rash and Other (See Comments)    Plastic tape causes rash, paper tape is okay, tegaderm is ok  . Penicillins Rash    MEDICATIONS: Current Outpatient Medications on File Prior to Visit  Medication Sig Dispense Refill  . busPIRone (BUSPAR) 5 MG tablet Take 1 tablet (5 mg total) by mouth 2 (two) times daily as needed. 60 tablet 1  . cetirizine (ZYRTEC) 10 MG tablet Take 10 mg by mouth daily.    . chlorhexidine (PERIDEX) 0.12 % solution Use as directed 15 mLs in the mouth or throat 2 (two) times daily.    . Insulin Pen Needle 32G X 4 MM MISC 1 Package by Does not apply route every morning. 100 each 0  . levothyroxine (SYNTHROID, LEVOTHROID) 88 MCG tablet Take 1 tablet (88 mcg total) by mouth daily before breakfast. 30 tablet 0  . liraglutide (VICTOZA) 18 MG/3ML SOPN Inject 0.3 mLs (1.8 mg total) into the skin daily. 3 pen 0  . metFORMIN  (GLUCOPHAGE-XR) 750 MG 24 hr tablet TAKE 2 TABLETS BY MOUTH DAILY WITH BREAKFAST. 60 tablet 0  . MIRVASO 0.33 % GEL   2   No current facility-administered medications on file prior to visit.     PAST MEDICAL HISTORY: Past Medical History:  Diagnosis Date  . DM (diabetes mellitus), type 2 ([place])   . Hypothyroidism   . Infertility, female   . PCOS (polycystic ovarian syndrome)   . Varicella     PAST SURGICAL HISTORY: Past Surgical History:  Procedure Laterality Date  . CESAREAN SECTION    . CESAREAN SECTION  07/01/2012   Procedure: CESAREAN SECTION;  Surgeon: Lovenia Kim, MD;  Location: [facility];  Service: Obstetrics;  Laterality: N/A;  Repeat cesarean section with delivery of baby girl at 71. Apgars 9/9.  Marland Kitchen CESAREAN SECTION N/A 10/25/2014   Procedure: Repeat CESAREAN SECTION;  Surgeon: Brien Few, MD;  Location: [facility];  Service: Obstetrics;  Laterality: N/A;  EDD: 10/31/14  . EYE SURGERY     lasic  . HARDWARE REMOVAL Left 06/05/2016   Procedure: HARDWARE REMOVAL;  Surgeon: Margaretha Sheffield, MD;  Location: [facility];  Service: ENT;  Laterality: Left;  HARDWARE REMOVED FROM LEFT MAXILLA  . IMAGE GUIDED SINUS SURGERY N/A 06/05/2016   Procedure: IMAGE GUIDED SINUS SURGERY;  Surgeon: Margaretha Sheffield, MD;  Location: [facility];  Service: ENT;  Laterality: N/A;  Gave disk to cece on 11-15 kp  . MANDIBLE SURGERY    . MAXILLARY ANTROSTOMY Right 06/05/2016   Procedure: MAXILLARY ANTROSTOMY;  Surgeon: Margaretha Sheffield, MD;  Location: [facility];  Service: ENT;  Laterality: Right;  . NASAL SINUS SURGERY    . SINUSOTOMY Left 06/05/2016   Procedure: SINUSOTOMY VIA CALDWELL LUC;  Surgeon: Margaretha Sheffield, MD;  Location: [facility];  Service: ENT;  Laterality: Left;  Diabetic -- oral meds  . TONSILLECTOMY    . TURBINATE REDUCTION Bilateral 06/05/2016   Procedure: PARTIAL TURBINATE REDUCTION;  Surgeon: Margaretha Sheffield, MD;  Location: [facility];  Service: ENT;   Laterality: Bilateral;    SOCIAL HISTORY: Social History   Tobacco Use  . Smoking status: Never Smoker  . Smokeless tobacco: Never Used  Substance Use Topics  . Alcohol use: No  . Drug use: No    FAMILY HISTORY: Family History  Problem Relation Age of Onset  . Diabetes Mother   . Depression Mother   . Sleep apnea Mother   . Obesity Mother   . Heart attack Father   . Hypertension Father   . Hyperlipidemia Father   . Heart disease Father   . Heart disease Maternal Grandfather   . Diabetes Paternal Grandmother     ROS: Review of Systems  Constitutional: Negative for weight loss.  Gastrointestinal: Negative for nausea and vomiting.  Musculoskeletal:       Negative muscle weakness    PHYSICAL EXAM: Blood pressure 105/68, pulse 87, temperature 97.6 F (36.4 C), temperature source Oral, height 5\' 6"  (1.676 m), weight 245 lb (111.1 kg), SpO2 98 %, unknown if currently breastfeeding. Body mass index is 39.54 kg/m. Physical Exam  Constitutional: She is oriented to person, place, and time. She appears well-developed and well-nourished.  Cardiovascular: Normal rate.  Pulmonary/Chest: Effort normal.  Musculoskeletal: Normal range of motion.  Neurological: She is oriented to person, place, and time.  Skin: Skin is warm and dry.  Psychiatric: She has a normal mood and affect. Her behavior is normal.  Vitals reviewed.   RECENT LABS AND TESTS: BMET    Component Value Date/Time   NA 139 09/03/2017 0848   K 4.2 09/03/2017 0848   CL 101 09/03/2017 0848   CO2 19 (L) 09/03/2017 0848   GLUCOSE 79 09/03/2017 0848   GLUCOSE 104 (H) 07/09/2016 1213   BUN 14 09/03/2017 0848   CREATININE 0.70 09/03/2017 0848   CALCIUM 9.9 09/03/2017 0848   GFRNONAA 111 09/03/2017 0848   GFRAA 128 09/03/2017 0848   Lab Results  Component Value Date   HGBA1C 5.6 09/03/2017   HGBA1C 5.5 05/12/2017   HGBA1C 6.6 (H) 02/09/2017   HGBA1C 6.1 07/09/2016   HGBA1C 5.8 01/03/2016   Lab Results    Component Value Date   INSULIN 40.1 (H) 09/03/2017   INSULIN 39.3 (H) 05/12/2017   INSULIN 42.1 (H) 02/09/2017   CBC    Component Value Date/Time   WBC 9.9 09/03/2017 0848   WBC 10.6 (H) 10/26/2014 0545   RBC 5.12 09/03/2017 0848   RBC 4.47 10/26/2014 0545   HGB 14.8 09/03/2017 0848   HCT 43.1 09/03/2017 0848   PLT 394 (H) 02/09/2017 1053   MCV 84 09/03/2017 0848   MCH 28.9 09/03/2017 0848   MCH 26.8 10/26/2014 0545   MCHC 34.3 09/03/2017 0848   MCHC 33.7 10/26/2014 0545   RDW 14.1 09/03/2017 0848   LYMPHSABS 2.1  09/03/2017 0848   MONOABS 0.5 05/20/2013 0925   EOSABS 0.1 09/03/2017 0848   BASOSABS 0.0 09/03/2017 0848   Iron/TIBC/Ferritin/ %Sat No results found for: IRON, TIBC, FERRITIN, IRONPCTSAT Lipid Panel     Component Value Date/Time   CHOL 169 09/03/2017 0848   TRIG 112 09/03/2017 0848   HDL 37 (L) 09/03/2017 0848   CHOLHDL 5 05/20/2013 0925   VLDL 23.4 05/20/2013 0925   LDLCALC 110 (H) 09/03/2017 0848   Hepatic Function Panel     Component Value Date/Time   PROT 7.3 09/03/2017 0848   ALBUMIN 4.4 09/03/2017 0848   AST 21 09/03/2017 0848   ALT 55 (H) 09/03/2017 0848   ALKPHOS 78 09/03/2017 0848   BILITOT 0.4 09/03/2017 0848      Component Value Date/Time   TSH 3.910 09/03/2017 0848   TSH 4.460 05/12/2017 0805   TSH 5.250 (H) 02/09/2017 1053  Results for NAOMI, CASTROGIOVANNI (MRN [id number]) as of 09/29/2017 16:27  Ref. Range 09/03/2017 08:48  Vitamin D, 25-Hydroxy Latest Ref Range: 30.0 - 100.0 ng/mL 32.6    ASSESSMENT AND PLAN: Vitamin D deficiency - Plan: Vitamin D, Ergocalciferol, (DRISDOL) 50000 units CAPS capsule  At risk for osteoporosis  Class 2 severe obesity with serious comorbidity and body mass index (BMI) of 39.0 to 39.9 in adult, unspecified obesity type ([place])  PLAN:  Vitamin D Deficiency Tammy Mooney was informed that low vitamin D levels contributes to fatigue and are associated with obesity, breast, and colon cancer. Tammy Mooney agrees to  continue taking prescription Vit D @50 ,000 IU every week #4 and we will refill for 1 month. She will follow up for routine testing of vitamin D, at least 2-3 times per year. She was informed of the risk of over-replacement of vitamin D and agrees to not increase her dose unless she discusses this with [name] first. We will recheck labs in 2 months and Tammy Mooney agrees to follow up with our clinic in 3 weeks.  At risk for osteopenia and osteoporosis Tammy Mooney is at risk for osteopenia and osteoporsis due to her vitamin D deficiency. She was encouraged to take her vitamin D and follow her higher calcium diet and increase strengthening exercise to help strengthen her bones and decrease her risk of osteopenia and osteoporosis.  Obesity Tammy Mooney is currently in the action stage of change. As such, her goal is to continue with weight loss efforts She has agreed to follow the Category 3 plan Tammy Mooney has been instructed to work up to a goal of 150 minutes of combined cardio and strengthening exercise per week for weight loss and overall health benefits. We discussed the following Behavioral Modification Strategies today: increasing lean protein intake, decreasing simple carbohydrates, holiday eating strategies, travel eating strategies, and celebration eating strategies    Tammy Mooney has agreed to follow up with our clinic in 3 weeks. She was informed of the importance of frequent follow up visits to maximize her success with intensive lifestyle modifications for her multiple health conditions.   OBESITY BEHAVIORAL INTERVENTION VISIT  Today's visit was # 16 out of 22.  Starting weight: 260 lbs Starting date: 02/09/17 Today's weight : 245 lbs Today's date: 09/29/2017 Total lbs lost to date: 15 (Patients must lose 7 lbs in the first 6 months to continue with counseling)   ASK: We discussed the diagnosis of obesity with Tammy Mooney today and Tammy Mooney agreed to give [name] permission to discuss obesity  behavioral modification therapy today.  ASSESS: Tammy Mooney has the diagnosis of  obesity and her BMI today is 39.56 Tammy Mooney is in the action stage of change   ADVISE: Tammy Mooney was educated on the multiple health risks of obesity as well as the benefit of weight loss to improve her health. She was advised of the need for long term treatment and the importance of lifestyle modifications.  AGREE: Multiple dietary modification options and treatment options were discussed and  Tammy Mooney agreed to the above obesity treatment plan.  I, Trixie Dredge, am acting as transcriptionist for Dennard Nip, MD  I have reviewed the above documentation for accuracy and completeness, and I agree with the above. -Dennard Nip, MD

## 2017-10-21 ENCOUNTER — Ambulatory Visit (INDEPENDENT_AMBULATORY_CARE_PROVIDER_SITE_OTHER): Payer: 59 | Admitting: Family Medicine

## 2017-10-21 VITALS — BP 119/80 | HR 102 | Temp 97.6°F | Ht 66.0 in | Wt 246.0 lb

## 2017-10-21 DIAGNOSIS — E119 Type 2 diabetes mellitus without complications: Secondary | ICD-10-CM

## 2017-10-21 DIAGNOSIS — E559 Vitamin D deficiency, unspecified: Secondary | ICD-10-CM | POA: Diagnosis not present

## 2017-10-21 DIAGNOSIS — Z9189 Other specified personal risk factors, not elsewhere classified: Secondary | ICD-10-CM | POA: Diagnosis not present

## 2017-10-21 DIAGNOSIS — Z6839 Body mass index (BMI) 39.0-39.9, adult: Secondary | ICD-10-CM | POA: Diagnosis not present

## 2017-10-21 MED ORDER — METFORMIN HCL ER 750 MG PO TB24: ORAL_TABLET | ORAL | 0 refills | Status: DC

## 2017-10-21 MED ORDER — VITAMIN D (ERGOCALCIFEROL) 1.25 MG (50000 UNIT) PO CAPS: 50000.0000 [IU] | ORAL_CAPSULE | ORAL | 0 refills | Status: DC

## 2017-10-21 MED ORDER — LIRAGLUTIDE 18 MG/3ML ~~LOC~~ SOPN: 1.8000 mg | PEN_INJECTOR | Freq: Every day | SUBCUTANEOUS | 0 refills | Status: DC

## 2017-10-21 NOTE — Progress Notes (Signed)
Office: [phone]  /  Fax: [phone]   HPI:   Chief Complaint: OBESITY Tammy Mooney is here to discuss her progress with her obesity treatment plan. She is on the  keep a food journal with 1500 to 1700 calories and 90+ grmas of protein and is following her eating plan approximately 75 % of the time. She states she is walking for 30 minutes 2 to 3 times per week. Tammy Mooney  Has tried to control her portions and make smarter food choices while on vacation and for her birthday. She has done well maintaining her weight for the last 2 to 3 months and is ready to get back on track with weight loss.Tammy Mooney  Her weight is 246 lb (111.6 kg) today and has had a weight gain of 1 pound over a period of 3 weeks since her last visit. She has lost 14 lbs since starting treatment with [name].  Vitamin D deficiency Tammy Mooney has a diagnosis of vitamin D deficiency. She is stable on vit D and denies nausea, vomiting or muscle weakness.  Diabetes II Tammy Mooney has a diagnosis of diabetes type II. Tammy Mooney did not bring in her blood sugar log today and denies any hypoglycemic episodes. She is stable on victoza and metformin. She stopped victoza while she was on vacation and she noted that polyphagia increased. This has since improved since re-starting victoza.She has been working on intensive lifestyle modifications including diet, exercise, and weight loss to help control her blood glucose levels.  At risk for cardiovascular disease Tammy Mooney is at a higher than average risk for cardiovascular disease due to obesity and diabetes. She currently denies any chest pain.   ALLERGIES: Allergies  Allergen Reactions  . Adhesive [Tape] Rash and Other (See Comments)    Plastic tape causes rash, paper tape is okay, tegaderm is ok  . Penicillins Rash    MEDICATIONS: Current Outpatient Medications on File Prior to Visit  Medication Sig Dispense Refill  . busPIRone (BUSPAR) 5 MG tablet Take 1 tablet (5 mg total) by mouth 2 (two)  times daily as needed. 60 tablet 1  . cetirizine (ZYRTEC) 10 MG tablet Take 10 mg by mouth daily.    . chlorhexidine (PERIDEX) 0.12 % solution Use as directed 15 mLs in the mouth or throat 2 (two) times daily.    . Insulin Pen Needle 32G X 4 MM MISC 1 Package by Does not apply route every morning. 100 each 0  . levothyroxine (SYNTHROID, LEVOTHROID) 88 MCG tablet Take 1 tablet (88 mcg total) by mouth daily before breakfast. 30 tablet 0  . MIRVASO 0.33 % GEL   2   No current facility-administered medications on file prior to visit.     PAST MEDICAL HISTORY: Past Medical History:  Diagnosis Date  . DM (diabetes mellitus), type 2 ([place])   . Hypothyroidism   . Infertility, female   . PCOS (polycystic ovarian syndrome)   . Varicella     PAST SURGICAL HISTORY: Past Surgical History:  Procedure Laterality Date  . CESAREAN SECTION    . CESAREAN SECTION  07/01/2012   Procedure: CESAREAN SECTION;  Surgeon: Lovenia Kim, MD;  Location: [facility];  Service: Obstetrics;  Laterality: N/A;  Repeat cesarean section with delivery of baby girl at 50. Apgars 9/9.  Tammy Mooney CESAREAN SECTION N/A 10/25/2014   Procedure: Repeat CESAREAN SECTION;  Surgeon: Brien Few, MD;  Location: [facility];  Service: Obstetrics;  Laterality: N/A;  EDD: 10/31/14  . EYE SURGERY  lasic  . HARDWARE REMOVAL Left 06/05/2016   Procedure: HARDWARE REMOVAL;  Surgeon: Margaretha Sheffield, MD;  Location: [facility];  Service: ENT;  Laterality: Left;  HARDWARE REMOVED FROM LEFT MAXILLA  . IMAGE GUIDED SINUS SURGERY N/A 06/05/2016   Procedure: IMAGE GUIDED SINUS SURGERY;  Surgeon: Margaretha Sheffield, MD;  Location: [facility];  Service: ENT;  Laterality: N/A;  Gave disk to cece on 11-15 kp  . MANDIBLE SURGERY    . MAXILLARY ANTROSTOMY Right 06/05/2016   Procedure: MAXILLARY ANTROSTOMY;  Surgeon: Margaretha Sheffield, MD;  Location: [facility];  Service: ENT;  Laterality: Right;  . NASAL SINUS SURGERY    . SINUSOTOMY Left  06/05/2016   Procedure: SINUSOTOMY VIA CALDWELL LUC;  Surgeon: Margaretha Sheffield, MD;  Location: [facility];  Service: ENT;  Laterality: Left;  Diabetic -- oral meds  . TONSILLECTOMY    . TURBINATE REDUCTION Bilateral 06/05/2016   Procedure: PARTIAL TURBINATE REDUCTION;  Surgeon: Margaretha Sheffield, MD;  Location: [facility];  Service: ENT;  Laterality: Bilateral;    SOCIAL HISTORY: Social History   Tobacco Use  . Smoking status: Never Smoker  . Smokeless tobacco: Never Used  Substance Use Topics  . Alcohol use: No  . Drug use: No    FAMILY HISTORY: Family History  Problem Relation Age of Onset  . Diabetes Mother   . Depression Mother   . Sleep apnea Mother   . Obesity Mother   . Heart attack Father   . Hypertension Father   . Hyperlipidemia Father   . Heart disease Father   . Heart disease Maternal Grandfather   . Diabetes Paternal Grandmother     ROS: Review of Systems  Constitutional: Negative for weight loss.  Cardiovascular: Negative for chest pain.  Gastrointestinal: Negative for nausea and vomiting.  Musculoskeletal:       Negative for muscle weakness  Endo/Heme/Allergies:       Positive for polyphagia Negative for hypoglycemia    PHYSICAL EXAM: Blood pressure 119/80, pulse (!) 102, temperature 97.6 F (36.4 C), temperature source Oral, height 5\' 6"  (1.676 m), weight 246 lb (111.6 kg), SpO2 97 %, unknown if currently breastfeeding. Body mass index is 39.71 kg/m. Physical Exam  Constitutional: She is oriented to person, place, and time. She appears well-developed and well-nourished.  Cardiovascular: Normal rate.  Pulmonary/Chest: Effort normal.  Musculoskeletal: Normal range of motion.  Neurological: She is oriented to person, place, and time.  Skin: Skin is warm and dry.  Psychiatric: She has a normal mood and affect. Her behavior is normal.  Vitals reviewed.   RECENT LABS AND TESTS: BMET    Component Value Date/Time   NA 139 09/03/2017  0848   K 4.2 09/03/2017 0848   CL 101 09/03/2017 0848   CO2 19 (L) 09/03/2017 0848   GLUCOSE 79 09/03/2017 0848   GLUCOSE 104 (H) 07/09/2016 1213   BUN 14 09/03/2017 0848   CREATININE 0.70 09/03/2017 0848   CALCIUM 9.9 09/03/2017 0848   GFRNONAA 111 09/03/2017 0848   GFRAA 128 09/03/2017 0848   Lab Results  Component Value Date   HGBA1C 5.6 09/03/2017   HGBA1C 5.5 05/12/2017   HGBA1C 6.6 (H) 02/09/2017   HGBA1C 6.1 07/09/2016   HGBA1C 5.8 01/03/2016   Lab Results  Component Value Date   INSULIN 40.1 (H) 09/03/2017   INSULIN 39.3 (H) 05/12/2017   INSULIN 42.1 (H) 02/09/2017   CBC    Component Value Date/Time   WBC 9.9 09/03/2017  0848   WBC 10.6 (H) 10/26/2014 0545   RBC 5.12 09/03/2017 0848   RBC 4.47 10/26/2014 0545   HGB 14.8 09/03/2017 0848   HCT 43.1 09/03/2017 0848   PLT 394 (H) 02/09/2017 1053   MCV 84 09/03/2017 0848   MCH 28.9 09/03/2017 0848   MCH 26.8 10/26/2014 0545   MCHC 34.3 09/03/2017 0848   MCHC 33.7 10/26/2014 0545   RDW 14.1 09/03/2017 0848   LYMPHSABS 2.1 09/03/2017 0848   MONOABS 0.5 05/20/2013 0925   EOSABS 0.1 09/03/2017 0848   BASOSABS 0.0 09/03/2017 0848   Iron/TIBC/Ferritin/ %Sat No results found for: IRON, TIBC, FERRITIN, IRONPCTSAT Lipid Panel     Component Value Date/Time   CHOL 169 09/03/2017 0848   TRIG 112 09/03/2017 0848   HDL 37 (L) 09/03/2017 0848   CHOLHDL 5 05/20/2013 0925   VLDL 23.4 05/20/2013 0925   LDLCALC 110 (H) 09/03/2017 0848   Hepatic Function Panel     Component Value Date/Time   PROT 7.3 09/03/2017 0848   ALBUMIN 4.4 09/03/2017 0848   AST 21 09/03/2017 0848   ALT 55 (H) 09/03/2017 0848   ALKPHOS 78 09/03/2017 0848   BILITOT 0.4 09/03/2017 0848      Component Value Date/Time   TSH 3.910 09/03/2017 0848   TSH 4.460 05/12/2017 0805   TSH 5.250 (H) 02/09/2017 1053   Results for CARLA, RASHAD (MRN [id number]) as of 10/21/2017 15:50  Ref. Range 09/03/2017 08:48  Vitamin D, 25-Hydroxy Latest Ref  Range: 30.0 - 100.0 ng/mL 32.6   ASSESSMENT AND PLAN: Type 2 diabetes mellitus without complication, without long-term current use of insulin ([name]) - Plan: liraglutide (VICTOZA) 18 MG/3ML SOPN, metFORMIN (GLUCOPHAGE-XR) 750 MG 24 hr tablet  Vitamin D deficiency - Plan: Vitamin D, Ergocalciferol, (DRISDOL) 50000 units CAPS capsule  At risk for heart disease  Class 2 severe obesity with serious comorbidity and body mass index (BMI) of 39.0 to 39.9 in adult, unspecified obesity type ([place])  PLAN:  Vitamin D Deficiency Tammy Mooney was informed that low vitamin D levels contributes to fatigue and are associated with obesity, breast, and colon cancer. She agrees to continue to take prescription Vit D @50 ,000 IU every week #4 with no refills and will follow up for routine testing of vitamin D, at least 2-3 times per year. She was informed of the risk of over-replacement of vitamin D and agrees to not increase her dose unless she discusses this with [name] first. Tammy Mooney agrees to follow up with our clinic in 2 weeks.  Diabetes II Tammy Mooney has been given extensive diabetes education by myself today including ideal fasting and post-prandial blood glucose readings, individual ideal Hgb A1c goals and hypoglycemia prevention. We discussed the importance of good blood sugar control to decrease the likelihood of diabetic complications such as nephropathy, neuropathy, limb loss, blindness, coronary artery disease, and death. We discussed the importance of intensive lifestyle modification including diet, exercise and weight loss as the first line treatment for diabetes. Tammy Mooney agrees to continue victoza 1.8 mg qd #3 pen with no refills and metformin 750 mg 2 tablets by mouth daily #60 with no refills and follow up at the agreed upon time.  Cardiovascular risk counseling Tammy Mooney was given extended (15 minutes) coronary artery disease prevention counseling today. She is 37 y.o. female and has risk factors for heart disease  including obesity and diabetes. We discussed intensive lifestyle modifications today with an emphasis on specific weight loss instructions and strategies. Pt was also informed  of the importance of increasing exercise and decreasing saturated fats to help prevent heart disease.  Obesity Tammy Mooney is currently in the action stage of change. As such, her goal is to continue with weight loss efforts She has agreed to keep a food journal with 1500 to 1700 calories and 90+ grmas of protein daily Tammy Mooney has been instructed to work up to a goal of 150 minutes of combined cardio and strengthening exercise per week for weight loss and overall health benefits. We discussed the following Behavioral Modification Strategies today: keep a strict food journal, increasing lean protein intake and decrease eating out  Tammy Mooney has agreed to follow up with our clinic in 2 weeks. She was informed of the importance of frequent follow up visits to maximize her success with intensive lifestyle modifications for her multiple health conditions.   OBESITY BEHAVIORAL INTERVENTION VISIT  Today's visit was # 17 out of 22.  Starting weight: 260 lbs Starting date: 02/09/17 Today's weight : 246 lbs  Today's date: 10/21/2017 Total lbs lost to date: 14 (Patients must lose 7 lbs in the first 6 months to continue with counseling)   ASK: We discussed the diagnosis of obesity with Tammy Mooney today and Tammy Mooney agreed to give [name] permission to discuss obesity behavioral modification therapy today.  ASSESS: Tammy Mooney has the diagnosis of obesity and her BMI today is 39.72 Tammy Mooney is in the action stage of change   ADVISE: Tammy Mooney was educated on the multiple health risks of obesity as well as the benefit of weight loss to improve her health. She was advised of the need for long term treatment and the importance of lifestyle modifications.  AGREE: Multiple dietary modification options and treatment options were discussed  and  Tammy Mooney agreed to the above obesity treatment plan.  I, Doreene Nest, am acting as transcriptionist for Dennard Nip, MD  I have reviewed the above documentation for accuracy and completeness, and I agree with the above. -Dennard Nip, MD

## 2017-10-30 ENCOUNTER — Other Ambulatory Visit (INDEPENDENT_AMBULATORY_CARE_PROVIDER_SITE_OTHER): Payer: Self-pay | Admitting: Family Medicine

## 2017-10-30 DIAGNOSIS — E038 Other specified hypothyroidism: Secondary | ICD-10-CM

## 2017-10-30 MED FILL — METFORMIN HCL ER 750 MG TAB: 750 | 30 days supply | Qty: 60 | Fill #0

## 2017-10-30 MED FILL — VICTOZA 18 MG/3 ML INJECT P: 18 | 30 days supply | Qty: 9 | Fill #0

## 2017-10-30 MED FILL — VIT D2 1.25 MG (50,000 UNIT: 1.25 MG | 28 days supply | Qty: 4 | Fill #0

## 2017-11-04 ENCOUNTER — Ambulatory Visit (INDEPENDENT_AMBULATORY_CARE_PROVIDER_SITE_OTHER): Payer: 59 | Admitting: Family Medicine

## 2017-11-04 VITALS — BP 104/71 | HR 86 | Temp 97.7°F | Ht 67.0 in | Wt 243.0 lb

## 2017-11-04 DIAGNOSIS — E119 Type 2 diabetes mellitus without complications: Secondary | ICD-10-CM | POA: Diagnosis not present

## 2017-11-04 DIAGNOSIS — Z9189 Other specified personal risk factors, not elsewhere classified: Secondary | ICD-10-CM

## 2017-11-04 DIAGNOSIS — Z6839 Body mass index (BMI) 39.0-39.9, adult: Secondary | ICD-10-CM

## 2017-11-04 DIAGNOSIS — E038 Other specified hypothyroidism: Secondary | ICD-10-CM | POA: Diagnosis not present

## 2017-11-04 MED ORDER — LEVOTHYROXINE SODIUM 88 MCG PO TABS: 88.0000 ug | ORAL_TABLET | Freq: Every day | ORAL | 0 refills | Status: DC

## 2017-11-04 MED FILL — LEVOTHYROXINE 88 MCG TABLET: 88 | 30 days supply | Qty: 30 | Fill #0

## 2017-11-04 NOTE — Progress Notes (Signed)
Office: [phone]  /  Fax: [phone]   HPI:   Chief Complaint: OBESITY Tammy Mooney is here to discuss her progress with her obesity treatment plan. She is on the Category 3 plan and is following her eating plan approximately 40 % of the time. She states she is walking for 30 minutes 2 times per week. Tammy Mooney continues to do well with weight loss on the category 3 plan. She often journals when she doesn't follow the category 3 plan, but she is working on increasing vegetables and lean protein. Her weight is 243 lb (110.2 kg) today and has had a weight loss of 3 pounds over a period of 2 weeks since her last visit. She has lost 17 lbs since starting treatment with [name].  Hypothyroid Tammy Mooney has a diagnosis of hypothyroidism. She is stable on levothyroxine. She denies hot or cold intolerance or palpitations..  Diabetes II Tammy Mooney has a diagnosis of diabetes type II. She is currently on metformin and victoza. Tammy Mooney states fasting BGs range between 105 and 110 and denies any hypoglycemic episodes, nausea or vomiting. Last A1c was at 5.6 She has been working on intensive lifestyle modifications including diet, exercise, and weight loss to help control her blood glucose levels.  At risk for cardiovascular disease Tammy Mooney is at a higher than average risk for cardiovascular disease due to obesity and diabetes. She currently denies any chest pain.  ALLERGIES: Allergies  Allergen Reactions  . Adhesive [Tape] Rash and Other (See Comments)    Plastic tape causes rash, paper tape is okay, tegaderm is ok  . Penicillins Rash    MEDICATIONS: Current Outpatient Medications on File Prior to Visit  Medication Sig Dispense Refill  . busPIRone (BUSPAR) 5 MG tablet Take 1 tablet (5 mg total) by mouth 2 (two) times daily as needed. 60 tablet 1  . cetirizine (ZYRTEC) 10 MG tablet Take 10 mg by mouth daily.    . chlorhexidine (PERIDEX) 0.12 % solution Use as directed 15 mLs in the mouth or throat 2 (two)  times daily.    . Insulin Pen Needle 32G X 4 MM MISC 1 Package by Does not apply route every morning. 100 each 0  . liraglutide (VICTOZA) 18 MG/3ML SOPN Inject 0.3 mLs (1.8 mg total) into the skin daily. 3 pen 0  . metFORMIN (GLUCOPHAGE-XR) 750 MG 24 hr tablet TAKE 2 TABLETS BY MOUTH DAILY WITH BREAKFAST. 60 tablet 0  . MIRVASO 0.33 % GEL   2  . Vitamin D, Ergocalciferol, (DRISDOL) 50000 units CAPS capsule Take 1 capsule (50,000 Units total) by mouth every 7 (seven) days. 4 capsule 0   No current facility-administered medications on file prior to visit.     PAST MEDICAL HISTORY: Past Medical History:  Diagnosis Date  . DM (diabetes mellitus), type 2 ([place])   . Hypothyroidism   . Infertility, female   . PCOS (polycystic ovarian syndrome)   . Varicella     PAST SURGICAL HISTORY: Past Surgical History:  Procedure Laterality Date  . CESAREAN SECTION    . CESAREAN SECTION  07/01/2012   Procedure: CESAREAN SECTION;  Surgeon: Lovenia Kim, MD;  Location: [facility];  Service: Obstetrics;  Laterality: N/A;  Repeat cesarean section with delivery of baby girl at 43. Apgars 9/9.  Marland Kitchen CESAREAN SECTION N/A 10/25/2014   Procedure: Repeat CESAREAN SECTION;  Surgeon: Brien Few, MD;  Location: [facility];  Service: Obstetrics;  Laterality: N/A;  EDD: 10/31/14  . EYE SURGERY     lasic  .  HARDWARE REMOVAL Left 06/05/2016   Procedure: HARDWARE REMOVAL;  Surgeon: Margaretha Sheffield, MD;  Location: [facility];  Service: ENT;  Laterality: Left;  HARDWARE REMOVED FROM LEFT MAXILLA  . IMAGE GUIDED SINUS SURGERY N/A 06/05/2016   Procedure: IMAGE GUIDED SINUS SURGERY;  Surgeon: Margaretha Sheffield, MD;  Location: [name];  Service: ENT;  Laterality: N/A;  Gave disk to cece on 11-15 kp  . MANDIBLE SURGERY    . MAXILLARY ANTROSTOMY Right 06/05/2016   Procedure: MAXILLARY ANTROSTOMY;  Surgeon: Margaretha Sheffield, MD;  Location: [facility];  Service: ENT;  Laterality: Right;  . NASAL SINUS SURGERY      . SINUSOTOMY Left 06/05/2016   Procedure: SINUSOTOMY VIA CALDWELL LUC;  Surgeon: Margaretha Sheffield, MD;  Location: [place];  Service: ENT;  Laterality: Left;  Diabetic -- oral meds  . TONSILLECTOMY    . TURBINATE REDUCTION Bilateral 06/05/2016   Procedure: PARTIAL TURBINATE REDUCTION;  Surgeon: Margaretha Sheffield, MD;  Location: [place];  Service: ENT;  Laterality: Bilateral;    SOCIAL HISTORY: Social History   Tobacco Use  . Smoking status: Never Smoker  . Smokeless tobacco: Never Used  Substance Use Topics  . Alcohol use: No  . Drug use: No    FAMILY HISTORY: Family History  Problem Relation Age of Onset  . Diabetes Mother   . Depression Mother   . Sleep apnea Mother   . Obesity Mother   . Heart attack Father   . Hypertension Father   . Hyperlipidemia Father   . Heart disease Father   . Heart disease Maternal Grandfather   . Diabetes Paternal Grandmother     ROS: Review of Systems  Constitutional: Positive for weight loss.  Cardiovascular: Negative for chest pain and palpitations.  Gastrointestinal: Negative for nausea and vomiting.  Endo/Heme/Allergies:       Negative for hot or cold intolerance Negative for hypoglycemia    PHYSICAL EXAM: Blood pressure 104/71, pulse 86, temperature 97.7 F (36.5 C), temperature source Oral, height 5\' 7"  (1.702 m), weight 243 lb (110.2 kg), SpO2 98 %, unknown if currently breastfeeding. Body mass index is 38.06 kg/m. Physical Exam  Constitutional: She is oriented to person, place, and time. She appears well-developed and well-nourished.  Cardiovascular: Normal rate.  Pulmonary/Chest: Effort normal.  Musculoskeletal: Normal range of motion.  Neurological: She is oriented to person, place, and time.  Skin: Skin is warm and dry.  Psychiatric: She has a normal mood and affect.  Vitals reviewed.   RECENT LABS AND TESTS: BMET    Component Value Date/Time   NA 139 09/03/2017 0848   K 4.2 09/03/2017 0848   CL  101 09/03/2017 0848   CO2 19 (L) 09/03/2017 0848   GLUCOSE 79 09/03/2017 0848   GLUCOSE 104 (H) 07/09/2016 1213   BUN 14 09/03/2017 0848   CREATININE 0.70 09/03/2017 0848   CALCIUM 9.9 09/03/2017 0848   GFRNONAA 111 09/03/2017 0848   GFRAA 128 09/03/2017 0848   Lab Results  Component Value Date   HGBA1C 5.6 09/03/2017   HGBA1C 5.5 05/12/2017   HGBA1C 6.6 (H) 02/09/2017   HGBA1C 6.1 07/09/2016   HGBA1C 5.8 01/03/2016   Lab Results  Component Value Date   INSULIN 40.1 (H) 09/03/2017   INSULIN 39.3 (H) 05/12/2017   INSULIN 42.1 (H) 02/09/2017   CBC    Component Value Date/Time   WBC 9.9 09/03/2017 0848   WBC 10.6 (H) 10/26/2014 0545   RBC 5.12 09/03/2017 0848  RBC 4.47 10/26/2014 0545   HGB 14.8 09/03/2017 0848   HCT 43.1 09/03/2017 0848   PLT 394 (H) 02/09/2017 1053   MCV 84 09/03/2017 0848   MCH 28.9 09/03/2017 0848   MCH 26.8 10/26/2014 0545   MCHC 34.3 09/03/2017 0848   MCHC 33.7 10/26/2014 0545   RDW 14.1 09/03/2017 0848   LYMPHSABS 2.1 09/03/2017 0848   MONOABS 0.5 05/20/2013 0925   EOSABS 0.1 09/03/2017 0848   BASOSABS 0.0 09/03/2017 0848   Iron/TIBC/Ferritin/ %Sat No results found for: IRON, TIBC, FERRITIN, IRONPCTSAT Lipid Panel     Component Value Date/Time   CHOL 169 09/03/2017 0848   TRIG 112 09/03/2017 0848   HDL 37 (L) 09/03/2017 0848   CHOLHDL 5 05/20/2013 0925   VLDL 23.4 05/20/2013 0925   LDLCALC 110 (H) 09/03/2017 0848   Hepatic Function Panel     Component Value Date/Time   PROT 7.3 09/03/2017 0848   ALBUMIN 4.4 09/03/2017 0848   AST 21 09/03/2017 0848   ALT 55 (H) 09/03/2017 0848   ALKPHOS 78 09/03/2017 0848   BILITOT 0.4 09/03/2017 0848      Component Value Date/Time   TSH 3.910 09/03/2017 0848   TSH 4.460 05/12/2017 0805   TSH 5.250 (H) 02/09/2017 1053   Results for KARMINA, ZUFALL (MRN [id number]) as of 11/04/2017 17:40  Ref. Range 09/03/2017 08:48  Vitamin D, 25-Hydroxy Latest Ref Range: 30.0 - 100.0 ng/mL 32.6    ASSESSMENT AND PLAN: Other specified hypothyroidism - Plan: levothyroxine (SYNTHROID, LEVOTHROID) 88 MCG tablet  Type 2 diabetes mellitus without complication, without long-term current use of insulin ([name])  At risk for heart disease  Class 2 severe obesity with serious comorbidity and body mass index (BMI) of 39.0 to 39.9 in adult, unspecified obesity type [facility])  PLAN:  Hypothyroid Keani was informed of the importance of good thyroid control to help with weight loss efforts. She was also informed that supertheraputic thyroid levels are dangerous and will not improve weight loss results. We will recheck labs in 1 month. Tammy Mooney agrees to continue levothyroxine 88 mcg by mouth daily before breakfast #30 with no refills and follow up at the agreed upon time.  Diabetes II Tammy Mooney has been given extensive diabetes education by myself today including ideal fasting and post-prandial blood glucose readings, individual ideal Hgb A1c goals and hypoglycemia prevention. We discussed the importance of good blood sugar control to decrease the likelihood of diabetic complications such as nephropathy, neuropathy, limb loss, blindness, coronary artery disease, and death. We discussed the importance of intensive lifestyle modification including diet, exercise and weight loss as the first line treatment for diabetes. We will recheck labs in 1 month. Tammy Mooney agrees to continue her diet prescription and diabetes medications as prescribed and follow up at the agreed upon time.  Cardiovascular risk counseling Tammy Mooney was given extended (15 minutes) coronary artery disease prevention counseling today. She is 37 y.o. female and has risk factors for heart disease including obesity and diabetes. We discussed intensive lifestyle modifications today with an emphasis on specific weight loss instructions and strategies. Pt was also informed of the importance of increasing exercise and decreasing saturated fats to help  prevent heart disease.  Obesity Tammy Mooney is currently in the action stage of change. As such, her goal is to continue with weight loss efforts She has agreed to follow the Category 3 plan Tammy Mooney has been instructed to work up to a goal of 150 minutes of combined cardio and strengthening exercise per  week for weight loss and overall health benefits. We discussed the following Behavioral Modification Strategies today: increasing lean protein intake, decreasing simple carbohydrates  and travel eating strategies   Tammy Mooney has agreed to follow up with our clinic in 2 to 3 weeks. She was informed of the importance of frequent follow up visits to maximize her success with intensive lifestyle modifications for her multiple health conditions.   OBESITY BEHAVIORAL INTERVENTION VISIT  Today's visit was # 18 out of 22.  Starting weight: 260 lbs Starting date: 02/09/17 Today's weight : 243 lbs  Today's date: 11/04/2017 Total lbs lost to date: 17 (Patients must lose 7 lbs in the first 6 months to continue with counseling)   ASK: We discussed the diagnosis of obesity with Tammy Mooney today and Tammy Mooney agreed to give [name] permission to discuss obesity behavioral modification therapy today.  ASSESS: Tammy Mooney has the diagnosis of obesity and her BMI today is 38.05 Tammy Mooney is in the action stage of change   ADVISE: Tammy Mooney was educated on the multiple health risks of obesity as well as the benefit of weight loss to improve her health. She was advised of the need for long term treatment and the importance of lifestyle modifications.  AGREE: Multiple dietary modification options and treatment options were discussed and  Tammy Mooney agreed to the above obesity treatment plan.  I, Doreene Nest, am acting as transcriptionist for Dennard Nip, MD  I have reviewed the above documentation for accuracy and completeness, and I agree with the above. -Dennard Nip, MD

## 2017-11-25 ENCOUNTER — Ambulatory Visit (INDEPENDENT_AMBULATORY_CARE_PROVIDER_SITE_OTHER): Payer: 59 | Admitting: Family Medicine

## 2017-12-01 ENCOUNTER — Ambulatory Visit (INDEPENDENT_AMBULATORY_CARE_PROVIDER_SITE_OTHER): Payer: 59 | Admitting: Family Medicine

## 2017-12-01 VITALS — BP 106/73 | HR 87 | Temp 97.8°F | Ht 67.0 in | Wt 248.0 lb

## 2017-12-01 DIAGNOSIS — Z9189 Other specified personal risk factors, not elsewhere classified: Secondary | ICD-10-CM

## 2017-12-01 DIAGNOSIS — Z6838 Body mass index (BMI) 38.0-38.9, adult: Secondary | ICD-10-CM | POA: Diagnosis not present

## 2017-12-01 DIAGNOSIS — E038 Other specified hypothyroidism: Secondary | ICD-10-CM

## 2017-12-01 DIAGNOSIS — E559 Vitamin D deficiency, unspecified: Secondary | ICD-10-CM | POA: Diagnosis not present

## 2017-12-01 DIAGNOSIS — E119 Type 2 diabetes mellitus without complications: Secondary | ICD-10-CM | POA: Diagnosis not present

## 2017-12-01 MED ORDER — LIRAGLUTIDE 18 MG/3ML ~~LOC~~ SOPN
1.8000 mg | PEN_INJECTOR | Freq: Every day | SUBCUTANEOUS | 0 refills | Status: DC
Start: 2017-12-01 — End: 2018-02-08

## 2017-12-01 MED ORDER — VITAMIN D (ERGOCALCIFEROL) 1.25 MG (50000 UNIT) PO CAPS: 50000.0000 [IU] | ORAL_CAPSULE | ORAL | 0 refills | Status: DC

## 2017-12-01 MED ORDER — LEVOTHYROXINE SODIUM 88 MCG PO TABS: 88.0000 ug | ORAL_TABLET | Freq: Every day | ORAL | 0 refills | Status: DC

## 2017-12-01 MED ORDER — METFORMIN HCL ER 750 MG PO TB24: ORAL_TABLET | ORAL | 0 refills | Status: DC

## 2017-12-01 MED FILL — VICTOZA 18 MG/3 ML INJECT P: 18 | 30 days supply | Qty: 9 | Fill #0

## 2017-12-01 MED FILL — VIT D2 1.25 MG (50,000 UNIT: 1.25 MG | 28 days supply | Qty: 4 | Fill #0

## 2017-12-01 MED FILL — LEVOTHYROXINE 88 MCG TABLET: 88 | 30 days supply | Qty: 30 | Fill #0

## 2017-12-01 MED FILL — METFORMIN HCL ER 750 MG TAB: 750 | 30 days supply | Qty: 60 | Fill #0

## 2017-12-02 NOTE — Progress Notes (Signed)
Office: [phone]  /  Fax: [phone]   HPI:   Chief Complaint: OBESITY Tammy Mooney is here to discuss her progress with her obesity treatment plan. She is on the Category 3 plan and is following her eating plan approximately 10 % of the time. She states she is walking for 20 minutes 2 times per week. Tammy Mooney has gotten off track with weight loss in the last month due to hectic work and home life, feels ready to get back on track.  Her weight is 248 lb (112.5 kg) today and has gained 5 pounds since her last visit. She has lost 12 lbs since starting treatment with [name].  Hypothyroidism Tammy Mooney has a diagnosis of hypothyroidism. She is stable on levothyroxine. She denies hot or cold intolerance or palpitations.  Diabetes II Tammy Mooney has a diagnosis of diabetes type II. Tammy Mooney is doing well on diet prescription, she denies hypoglycemia. Fasting BGs range between 95 and 115. Last A1c was 5.6. She has been working on intensive lifestyle modifications including diet, exercise, and weight loss to help control her blood glucose levels.  At risk for cardiovascular disease Tammy Mooney is at a higher than average risk for cardiovascular disease due to obesity diabetes II. She currently denies any chest pain.  Vitamin D Deficiency Tammy Mooney has a diagnosis of vitamin D deficiency. She is stable on prescription Vit D, not yet at goal. She denies nausea, vomiting or muscle weakness.  ALLERGIES: Allergies  Allergen Reactions  . Adhesive [Tape] Rash and Other (See Comments)    Plastic tape causes rash, paper tape is okay, tegaderm is ok  . Penicillins Rash    MEDICATIONS: Current Outpatient Medications on File Prior to Visit  Medication Sig Dispense Refill  . busPIRone (BUSPAR) 5 MG tablet Take 1 tablet (5 mg total) by mouth 2 (two) times daily as needed. 60 tablet 1  . cetirizine (ZYRTEC) 10 MG tablet Take 10 mg by mouth daily.    . chlorhexidine (PERIDEX) 0.12 % solution Use as directed 15 mLs in  the mouth or throat 2 (two) times daily.    . Insulin Pen Needle 32G X 4 MM MISC 1 Package by Does not apply route every morning. 100 each 0  . MIRVASO 0.33 % GEL   2   No current facility-administered medications on file prior to visit.     PAST MEDICAL HISTORY: Past Medical History:  Diagnosis Date  . DM (diabetes mellitus), type 2 (Tammy Mooney)   . Hypothyroidism   . Infertility, female   . PCOS (polycystic ovarian syndrome)   . Varicella     PAST SURGICAL HISTORY: Past Surgical History:  Procedure Laterality Date  . CESAREAN SECTION    . CESAREAN SECTION  07/01/2012   Procedure: CESAREAN SECTION;  Surgeon: Lovenia Kim, MD;  Location: [facility];  Service: Obstetrics;  Laterality: N/A;  Repeat cesarean section with delivery of baby girl at 51. Apgars 9/9.  Marland Kitchen CESAREAN SECTION N/A 10/25/2014   Procedure: Repeat CESAREAN SECTION;  Surgeon: Brien Few, MD;  Location: [facility];  Service: Obstetrics;  Laterality: N/A;  EDD: 10/31/14  . EYE SURGERY     lasic  . HARDWARE REMOVAL Left 06/05/2016   Procedure: HARDWARE REMOVAL;  Surgeon: Margaretha Sheffield, MD;  Location: [place];  Service: ENT;  Laterality: Left;  HARDWARE REMOVED FROM LEFT MAXILLA  . IMAGE GUIDED SINUS SURGERY N/A 06/05/2016   Procedure: IMAGE GUIDED SINUS SURGERY;  Surgeon: Margaretha Sheffield, MD;  Location: [place];  Service:  ENT;  Laterality: N/A;  Gave disk to cece on 11-15 kp  . MANDIBLE SURGERY    . MAXILLARY ANTROSTOMY Right 06/05/2016   Procedure: MAXILLARY ANTROSTOMY;  Surgeon: Margaretha Sheffield, MD;  Location: [place];  Service: ENT;  Laterality: Right;  . NASAL SINUS SURGERY    . SINUSOTOMY Left 06/05/2016   Procedure: SINUSOTOMY VIA CALDWELL LUC;  Surgeon: Margaretha Sheffield, MD;  Location: [place];  Service: ENT;  Laterality: Left;  Diabetic -- oral meds  . TONSILLECTOMY    . TURBINATE REDUCTION Bilateral 06/05/2016   Procedure: PARTIAL TURBINATE REDUCTION;  Surgeon: Margaretha Sheffield, MD;   Location: [place];  Service: ENT;  Laterality: Bilateral;    SOCIAL HISTORY: Social History   Tobacco Use  . Smoking status: Never Smoker  . Smokeless tobacco: Never Used  Substance Use Topics  . Alcohol use: No  . Drug use: No    FAMILY HISTORY: Family History  Problem Relation Age of Onset  . Diabetes Mother   . Depression Mother   . Sleep apnea Mother   . Obesity Mother   . Heart attack Father   . Hypertension Father   . Hyperlipidemia Father   . Heart disease Father   . Heart disease Maternal Grandfather   . Diabetes Paternal Grandmother     ROS: Review of Systems  Constitutional: Negative for weight loss.  Cardiovascular: Negative for chest pain and palpitations.  Gastrointestinal: Negative for nausea and vomiting.  Musculoskeletal:       Negative muscle weakness  Endo/Heme/Allergies:       Negative hot/cold intolerance Negative hypoglycemia    PHYSICAL EXAM: Blood pressure 106/73, pulse 87, temperature 97.8 F (36.6 C), temperature source Oral, height 5\' 7"  (1.702 m), weight 248 lb (112.5 kg), SpO2 97 %, unknown if currently breastfeeding. Body mass index is 38.84 kg/m. Physical Exam  Constitutional: She is oriented to person, place, and time. She appears well-developed and well-nourished.  Cardiovascular: Normal rate.  Pulmonary/Chest: Effort normal.  Musculoskeletal: Normal range of motion.  Neurological: She is oriented to person, place, and time.  Skin: Skin is warm and dry.  Psychiatric: She has a normal mood and affect. Her behavior is normal.  Vitals reviewed.   RECENT LABS AND TESTS: BMET    Component Value Date/Time   NA 139 09/03/2017 0848   K 4.2 09/03/2017 0848   CL 101 09/03/2017 0848   CO2 19 (L) 09/03/2017 0848   GLUCOSE 79 09/03/2017 0848   GLUCOSE 104 (H) 07/09/2016 1213   BUN 14 09/03/2017 0848   CREATININE 0.70 09/03/2017 0848   CALCIUM 9.9 09/03/2017 0848   GFRNONAA 111 09/03/2017 0848   GFRAA 128 09/03/2017  0848   Lab Results  Component Value Date   HGBA1C 5.6 09/03/2017   HGBA1C 5.5 05/12/2017   HGBA1C 6.6 (H) 02/09/2017   HGBA1C 6.1 07/09/2016   HGBA1C 5.8 01/03/2016   Lab Results  Component Value Date   INSULIN 40.1 (H) 09/03/2017   INSULIN 39.3 (H) 05/12/2017   INSULIN 42.1 (H) 02/09/2017   CBC    Component Value Date/Time   WBC 9.9 09/03/2017 0848   WBC 10.6 (H) 10/26/2014 0545   RBC 5.12 09/03/2017 0848   RBC 4.47 10/26/2014 0545   HGB 14.8 09/03/2017 0848   HCT 43.1 09/03/2017 0848   PLT 394 (H) 02/09/2017 1053   MCV 84 09/03/2017 0848   MCH 28.9 09/03/2017 0848   MCH 26.8 10/26/2014 0545   MCHC 34.3 09/03/2017  0848   MCHC 33.7 10/26/2014 0545   RDW 14.1 09/03/2017 0848   LYMPHSABS 2.1 09/03/2017 0848   MONOABS 0.5 05/20/2013 0925   EOSABS 0.1 09/03/2017 0848   BASOSABS 0.0 09/03/2017 0848   Iron/TIBC/Ferritin/ %Sat No results found for: IRON, TIBC, FERRITIN, IRONPCTSAT Lipid Panel     Component Value Date/Time   CHOL 169 09/03/2017 0848   TRIG 112 09/03/2017 0848   HDL 37 (L) 09/03/2017 0848   CHOLHDL 5 05/20/2013 0925   VLDL 23.4 05/20/2013 0925   LDLCALC 110 (H) 09/03/2017 0848   Hepatic Function Panel     Component Value Date/Time   PROT 7.3 09/03/2017 0848   ALBUMIN 4.4 09/03/2017 0848   AST 21 09/03/2017 0848   ALT 55 (H) 09/03/2017 0848   ALKPHOS 78 09/03/2017 0848   BILITOT 0.4 09/03/2017 0848      Component Value Date/Time   TSH 3.910 09/03/2017 0848   TSH 4.460 05/12/2017 0805   TSH 5.250 (H) 02/09/2017 1053  Results for KHANIYA, TENAGLIA (MRN [id number]) as of 12/02/2017 15:22  Ref. Range 09/03/2017 08:48  Vitamin D, 25-Hydroxy Latest Ref Range: 30.0 - 100.0 ng/mL 32.6    ASSESSMENT AND PLAN: Other specified hypothyroidism - Plan: levothyroxine (SYNTHROID, LEVOTHROID) 88 MCG tablet  Type 2 diabetes mellitus without complication, without long-term current use of insulin ([name]) - Plan: liraglutide (VICTOZA) 18 MG/3ML SOPN, metFORMIN  (GLUCOPHAGE-XR) 750 MG 24 hr tablet  Vitamin D deficiency - Plan: Vitamin D, Ergocalciferol, (DRISDOL) 50000 units CAPS capsule  At risk for heart disease  Class 2 severe obesity with serious comorbidity and body mass index (BMI) of 38.0 to 38.9 in adult, unspecified obesity type ([place])  PLAN:  Hypothyroidism Tammy Mooney was informed of the importance of good thyroid control to help with weight loss efforts. She was also informed that supertheraputic thyroid levels are dangerous and will not improve weight loss results. Tammy Mooney agrees to continue taking levothyroxine 88 mcg q AM #30 and we will refill for 1 month and she agrees to follow up with our clinic in 3 weeks.  Diabetes II Tammy Mooney has been given extensive diabetes education by myself today including ideal fasting and post-prandial blood glucose readings, individual ideal Hgb A1c goals and hypoglycemia prevention. We discussed the importance of good blood sugar control to decrease the likelihood of diabetic complications such as nephropathy, neuropathy, limb loss, blindness, coronary artery disease, and death. We discussed the importance of intensive lifestyle modification including diet, exercise and weight loss as the first line treatment for diabetes. Tammy Mooney agrees to continue Victoza 1.8 mg #3 pens and we will refill for 1 month, and she agrees to continue taking metformin 750 mg 2 tablets q AM #60 and we will refill for 1 month. Tammy Mooney agrees to follow up with our clinic in 3 weeks.  Cardiovascular risk counselling Tammy Mooney was given extended (15 minutes) coronary artery disease prevention counseling today. She is 37 y.o. female and has risk factors for heart disease including obesity and diabetes II. We discussed intensive lifestyle modifications today with an emphasis on specific weight loss instructions and strategies. Pt was also informed of the importance of increasing exercise and decreasing saturated fats to help prevent heart  disease.  Vitamin D Deficiency Tammy Mooney was informed that low vitamin D levels contributes to fatigue and are associated with obesity, breast, and colon cancer. Tammy Mooney agrees to continue taking prescription Vit D @50 ,000 IU every week #4 and we will refill for 1 month. She will follow up  for routine testing of vitamin D, at least 2-3 times per year. She was informed of the risk of over-replacement of vitamin D and agrees to not increase her dose unless she discusses this with [name] first. Tammy Mooney agrees to follow up with our clinic in 3 weeks.  Obesity Tammy Mooney is currently in the action stage of change. As such, her goal is to continue with weight loss efforts She has agreed to change to follow a lower carbohydrate, vegetable and lean protein rich diet plan Tammy Mooney has been instructed to work up to a goal of 150 minutes of combined cardio and strengthening exercise per week for weight loss and overall health benefits. We discussed the following Behavioral Modification Strategies today: increasing lean protein intake and decreasing simple carbohydrates    Tammy Mooney has agreed to follow up with our clinic in 3 weeks. She was informed of the importance of frequent follow up visits to maximize her success with intensive lifestyle modifications for her multiple health conditions.   OBESITY BEHAVIORAL INTERVENTION VISIT  Today's visit was # 19 out of 22.  Starting weight: 260 lbs Starting date: 02/09/17 Today's weight : 248 lbs  Today's date: 12/01/2017 Total lbs lost to date: 12 (Patients must lose 7 lbs in the first 6 months to continue with counseling)   ASK: We discussed the diagnosis of obesity with Tammy Mooney today and Tammy Mooney agreed to give [name] permission to discuss obesity behavioral modification therapy today.  ASSESS: Tammy Mooney has the diagnosis of obesity and her BMI today is 38.83 Tammy Mooney is in the action stage of change   ADVISE: Tammy Mooney was educated on the multiple health  risks of obesity as well as the benefit of weight loss to improve her health. She was advised of the need for long term treatment and the importance of lifestyle modifications.  AGREE: Multiple dietary modification options and treatment options were discussed and  Verl agreed to the above obesity treatment plan.  I, Trixie Dredge, am acting as transcriptionist for Dennard Nip, MD  I have reviewed the above documentation for accuracy and completeness, and I agree with the above. -Dennard Nip, MD

## 2017-12-03 ENCOUNTER — Encounter (INDEPENDENT_AMBULATORY_CARE_PROVIDER_SITE_OTHER): Payer: Self-pay | Admitting: Family Medicine

## 2017-12-24 ENCOUNTER — Ambulatory Visit (INDEPENDENT_AMBULATORY_CARE_PROVIDER_SITE_OTHER): Payer: 59 | Admitting: Family Medicine

## 2017-12-24 VITALS — BP 98/67 | HR 81 | Ht 67.0 in | Wt 249.0 lb

## 2017-12-24 DIAGNOSIS — E119 Type 2 diabetes mellitus without complications: Secondary | ICD-10-CM | POA: Diagnosis not present

## 2017-12-24 DIAGNOSIS — Z9189 Other specified personal risk factors, not elsewhere classified: Secondary | ICD-10-CM | POA: Diagnosis not present

## 2017-12-24 DIAGNOSIS — Z6839 Body mass index (BMI) 39.0-39.9, adult: Secondary | ICD-10-CM | POA: Diagnosis not present

## 2017-12-24 DIAGNOSIS — E559 Vitamin D deficiency, unspecified: Secondary | ICD-10-CM

## 2017-12-24 DIAGNOSIS — E038 Other specified hypothyroidism: Secondary | ICD-10-CM | POA: Diagnosis not present

## 2017-12-24 MED ORDER — VITAMIN D (ERGOCALCIFEROL) 1.25 MG (50000 UNIT) PO CAPS: 50000.0000 [IU] | ORAL_CAPSULE | ORAL | 0 refills | Status: DC

## 2017-12-24 NOTE — Progress Notes (Signed)
Office: [phone]  /  Fax: [phone]   HPI:   Chief Complaint: OBESITY Tammy Mooney is here to discuss her progress with her obesity treatment plan. She is on the lower carbohydrate, vegetable and lean protein rich diet plan and is following her eating plan approximately 50 % of the time. She states she is exercising 0 minutes 0 times per week. Tammy Mooney is mostly maintaining weight, has not done well with meal planning and prepping lately and mostly portion control and smarter food choices.  Her weight is 249 lb (112.9 kg) today and has gained 1 pound since her last visit. She has lost 11 lbs since starting treatment with [name].  Vitamin D Deficiency Tammy Mooney has a diagnosis of vitamin D deficiency. She is stable on prescription Vit D, due for labs. She denies nausea, vomiting or muscle weakness.  Diabetes II Tammy Mooney has a diagnosis of diabetes type II. Tammy Mooney states she is not checking BGs, on metformin and Victoza. She is working on decreasing simple carbohydrates. She denies hypoglycemia. Last A1c was 5.6 on 09/03/17. She has been working on intensive lifestyle modifications including diet, exercise, and weight loss to help control her blood glucose levels.  Hypothyroidism Tammy Mooney has a diagnosis of hypothyroidism. She is on levothyroxine. She denies hot or cold intolerance or palpitations, due for labs.  At risk for cardiovascular disease Tammy Mooney is at a higher than average risk for cardiovascular disease due to obesity, diabetes II, and hypothyroidism. She currently denies any chest pain.  ALLERGIES: Allergies  Allergen Reactions  . Adhesive [Tape] Rash and Other (See Comments)    Plastic tape causes rash, paper tape is okay, tegaderm is ok  . Penicillins Rash    MEDICATIONS: Current Outpatient Medications on File Prior to Visit  Medication Sig Dispense Refill  . busPIRone (BUSPAR) 5 MG tablet Take 1 tablet (5 mg total) by mouth 2 (two) times daily as needed. 60 tablet 1  .  cetirizine (ZYRTEC) 10 MG tablet Take 10 mg by mouth daily.    . chlorhexidine (PERIDEX) 0.12 % solution Use as directed 15 mLs in the mouth or throat 2 (two) times daily.    . Insulin Pen Needle 32G X 4 MM MISC 1 Package by Does not apply route every morning. 100 each 0  . levothyroxine (SYNTHROID, LEVOTHROID) 88 MCG tablet Take 1 tablet (88 mcg total) by mouth daily before breakfast. 30 tablet 0  . liraglutide (VICTOZA) 18 MG/3ML SOPN Inject 0.3 mLs (1.8 mg total) into the skin daily. 3 pen 0  . metFORMIN (GLUCOPHAGE-XR) 750 MG 24 hr tablet TAKE 2 TABLETS BY MOUTH DAILY WITH BREAKFAST. 60 tablet 0  . MIRVASO 0.33 % GEL   2  . Vitamin D, Ergocalciferol, (DRISDOL) 50000 units CAPS capsule Take 1 capsule (50,000 Units total) by mouth every 7 (seven) days. 4 capsule 0   No current facility-administered medications on file prior to visit.     PAST MEDICAL HISTORY: Past Medical History:  Diagnosis Date  . DM (diabetes mellitus), type 2 ([place])   . Hypothyroidism   . Infertility, female   . PCOS (polycystic ovarian syndrome)   . Varicella     PAST SURGICAL HISTORY: Past Surgical History:  Procedure Laterality Date  . CESAREAN SECTION    . CESAREAN SECTION  07/01/2012   Procedure: CESAREAN SECTION;  Surgeon: Lovenia Kim, MD;  Location: [facility];  Service: Obstetrics;  Laterality: N/A;  Repeat cesarean section with delivery of baby girl at 63. Apgars 9/9.  Marland Kitchen  CESAREAN SECTION N/A 10/25/2014   Procedure: Repeat CESAREAN SECTION;  Surgeon: Brien Few, MD;  Location: [facility];  Service: Obstetrics;  Laterality: N/A;  EDD: 10/31/14  . EYE SURGERY     lasic  . HARDWARE REMOVAL Left 06/05/2016   Procedure: HARDWARE REMOVAL;  Surgeon: Margaretha Sheffield, MD;  Location: [place];  Service: ENT;  Laterality: Left;  HARDWARE REMOVED FROM LEFT MAXILLA  . IMAGE GUIDED SINUS SURGERY N/A 06/05/2016   Procedure: IMAGE GUIDED SINUS SURGERY;  Surgeon: Margaretha Sheffield, MD;  Location: [place];  Service: ENT;  Laterality: N/A;  Gave disk to cece on 11-15 kp  . MANDIBLE SURGERY    . MAXILLARY ANTROSTOMY Right 06/05/2016   Procedure: MAXILLARY ANTROSTOMY;  Surgeon: Margaretha Sheffield, MD;  Location: [place];  Service: ENT;  Laterality: Right;  . NASAL SINUS SURGERY    . SINUSOTOMY Left 06/05/2016   Procedure: SINUSOTOMY VIA CALDWELL LUC;  Surgeon: Margaretha Sheffield, MD;  Location: [place];  Service: ENT;  Laterality: Left;  Diabetic -- oral meds  . TONSILLECTOMY    . TURBINATE REDUCTION Bilateral 06/05/2016   Procedure: PARTIAL TURBINATE REDUCTION;  Surgeon: Margaretha Sheffield, MD;  Location: [place];  Service: ENT;  Laterality: Bilateral;    SOCIAL HISTORY: Social History   Tobacco Use  . Smoking status: Never Smoker  . Smokeless tobacco: Never Used  Substance Use Topics  . Alcohol use: No  . Drug use: No    FAMILY HISTORY: Family History  Problem Relation Age of Onset  . Diabetes Mother   . Depression Mother   . Sleep apnea Mother   . Obesity Mother   . Heart attack Father   . Hypertension Father   . Hyperlipidemia Father   . Heart disease Father   . Heart disease Maternal Grandfather   . Diabetes Paternal Grandmother     ROS: Review of Systems  Constitutional: Negative for weight loss.  Cardiovascular: Negative for chest pain and palpitations.  Gastrointestinal: Negative for nausea and vomiting.  Musculoskeletal:       Negative muscle weakness  Endo/Heme/Allergies:       Negative hypoglycemia Negative hot/cold intolerance    PHYSICAL EXAM: Blood pressure 98/67, pulse 81, height 5\' 7"  (1.702 m), weight 249 lb (112.9 kg), SpO2 98 %, unknown if currently breastfeeding. Body mass index is 39 kg/m. Physical Exam  Constitutional: She is oriented to person, place, and time. She appears well-developed and well-nourished.  Cardiovascular: Normal rate.  Pulmonary/Chest: Effort normal.  Musculoskeletal: Normal range of motion.    Neurological: She is oriented to person, place, and time.  Skin: Skin is warm and dry.  Psychiatric: She has a normal mood and affect. Her behavior is normal.  Vitals reviewed.   RECENT LABS AND TESTS: BMET    Component Value Date/Time   NA 139 09/03/2017 0848   K 4.2 09/03/2017 0848   CL 101 09/03/2017 0848   CO2 19 (L) 09/03/2017 0848   GLUCOSE 79 09/03/2017 0848   GLUCOSE 104 (H) 07/09/2016 1213   BUN 14 09/03/2017 0848   CREATININE 0.70 09/03/2017 0848   CALCIUM 9.9 09/03/2017 0848   GFRNONAA 111 09/03/2017 0848   GFRAA 128 09/03/2017 0848   Lab Results  Component Value Date   HGBA1C 5.6 09/03/2017   HGBA1C 5.5 05/12/2017   HGBA1C 6.6 (H) 02/09/2017   HGBA1C 6.1 07/09/2016   HGBA1C 5.8 01/03/2016   Lab Results  Component Value Date   INSULIN 40.1 (  H) 09/03/2017   INSULIN 39.3 (H) 05/12/2017   INSULIN 42.1 (H) 02/09/2017   CBC    Component Value Date/Time   WBC 9.9 09/03/2017 0848   WBC 10.6 (H) 10/26/2014 0545   RBC 5.12 09/03/2017 0848   RBC 4.47 10/26/2014 0545   HGB 14.8 09/03/2017 0848   HCT 43.1 09/03/2017 0848   PLT 394 (H) 02/09/2017 1053   MCV 84 09/03/2017 0848   MCH 28.9 09/03/2017 0848   MCH 26.8 10/26/2014 0545   MCHC 34.3 09/03/2017 0848   MCHC 33.7 10/26/2014 0545   RDW 14.1 09/03/2017 0848   LYMPHSABS 2.1 09/03/2017 0848   MONOABS 0.5 05/20/2013 0925   EOSABS 0.1 09/03/2017 0848   BASOSABS 0.0 09/03/2017 0848   Iron/TIBC/Ferritin/ %Sat No results found for: IRON, TIBC, FERRITIN, IRONPCTSAT Lipid Panel     Component Value Date/Time   CHOL 169 09/03/2017 0848   TRIG 112 09/03/2017 0848   HDL 37 (L) 09/03/2017 0848   CHOLHDL 5 05/20/2013 0925   VLDL 23.4 05/20/2013 0925   LDLCALC 110 (H) 09/03/2017 0848   Hepatic Function Panel     Component Value Date/Time   PROT 7.3 09/03/2017 0848   ALBUMIN 4.4 09/03/2017 0848   AST 21 09/03/2017 0848   ALT 55 (H) 09/03/2017 0848   ALKPHOS 78 09/03/2017 0848   BILITOT 0.4 09/03/2017  0848      Component Value Date/Time   TSH 3.910 09/03/2017 0848   TSH 4.460 05/12/2017 0805   TSH 5.250 (H) 02/09/2017 1053  Results for GARYN, [place] (MRN [id number]) as of 12/24/2017 08:08  Ref. Range 09/03/2017 08:48  Vitamin D, 25-Hydroxy Latest Ref Range: 30.0 - 100.0 ng/mL 32.6    ASSESSMENT AND PLAN: Vitamin D deficiency - Plan: VITAMIN D 25 Hydroxy (Vit-D Deficiency, Fractures), Vitamin D, Ergocalciferol, (DRISDOL) 50000 units CAPS capsule  Type 2 diabetes mellitus without complication, without long-term current use of insulin ([name]) - Plan: Vitamin B12, CBC With Differential, Comprehensive metabolic panel, Folate, Hemoglobin A1c, Insulin, random, Lipid Panel With LDL/HDL Ratio  Other specified hypothyroidism - Plan: T3, T4, free, TSH  At risk for heart disease  Class 2 severe obesity with serious comorbidity and body mass index (BMI) of 39.0 to 39.9 in adult, unspecified obesity type ([place])  PLAN:  Vitamin D Deficiency Kelyse was informed that low vitamin D levels contributes to fatigue and are associated with obesity, breast, and colon cancer. Verlean agrees to continue taking prescription Vit D @50 ,000 IU every week #4 and we will refill for 1 month. She will follow up for routine testing of vitamin D, at least 2-3 times per year. She was informed of the risk of over-replacement of vitamin D and agrees to not increase her dose unless she discusses this with [name] first. We will check labs and Tammy Mooney agrees to follow up with our clinic in 3 weeks.  Diabetes II Tammy Mooney has been given extensive diabetes education by myself today including ideal fasting and post-prandial blood glucose readings, individual ideal Hgb A1c goals and hypoglycemia prevention. We discussed the importance of good blood sugar control to decrease the likelihood of diabetic complications such as nephropathy, neuropathy, limb loss, blindness, coronary artery disease, and death. We discussed the importance of  intensive lifestyle modification including diet, exercise and weight loss as the first line treatment for diabetes. Tammy Mooney agrees to continue her diabetes medications, we will check labs and Tammy Mooney agrees to follow up with our clinic in 3 weeks.  Hypothyroidism Tammy Mooney was  informed of the importance of good thyroid control to help with weight loss efforts. She was also informed that supertheraputic thyroid levels are dangerous and will not improve weight loss results. We will check labs and Tammy Mooney agrees to follow up with our clinic in 3 weeks.  Cardiovascular risk counselling Tammy Mooney was given extended (15 minutes) coronary artery disease prevention counseling today. She is 37 y.o. female and has risk factors for heart disease including obesity, diabetes II, and hypothyroidism. We discussed intensive lifestyle modifications today with an emphasis on specific weight loss instructions and strategies. Pt was also informed of the importance of increasing exercise and decreasing saturated fats to help prevent heart disease.  Obesity Tammy Mooney is currently in the action stage of change. As such, her goal is to continue with weight loss efforts She has agreed to portion control better and make smarter food choices, such as increase vegetables and decrease simple carbohydrates  Tammy Mooney has been instructed to work up to a goal of 150 minutes of combined cardio and strengthening exercise per week for weight loss and overall health benefits. We discussed the following Behavioral Modification Strategies today: increasing lean protein intake, decreasing simple carbohydrates  and work on meal planning and easy cooking plans   Tammy Mooney has agreed to follow up with our clinic in 3 weeks. She was informed of the importance of frequent follow up visits to maximize her success with intensive lifestyle modifications for her multiple health conditions.   OBESITY BEHAVIORAL INTERVENTION VISIT  Today's visit was # 20  out of 22.  Starting weight: 260 lbs Starting date: 02/09/17 Today's weight : 249 lbs  Today's date: 12/24/2017 Total lbs lost to date: 11 (Patients must lose 7 lbs in the first 6 months to continue with counseling)   ASK: We discussed the diagnosis of obesity with Lorenza Cambridge today and Carrell agreed to give [name] permission to discuss obesity behavioral modification therapy today.  ASSESS: Cherril has the diagnosis of obesity and her BMI today is 38.99 Baila is in the action stage of change   ADVISE: Welda was educated on the multiple health risks of obesity as well as the benefit of weight loss to improve her health. She was advised of the need for long term treatment and the importance of lifestyle modifications.  AGREE: Multiple dietary modification options and treatment options were discussed and  Neeta agreed to the above obesity treatment plan.  I, Trixie Dredge, am acting as transcriptionist for Dennard Nip, MD  I have reviewed the above documentation for accuracy and completeness, and I agree with the above. -Dennard Nip, MD

## 2017-12-25 LAB — VITAMIN D 25 HYDROXY (VIT D DEFICIENCY, FRACTURES): VIT D 25 HYDROXY: 37.5 ng/mL (ref 30.0–100.0)

## 2017-12-25 LAB — INSULIN, RANDOM: INSULIN: 79.9 u[IU]/mL — ABNORMAL HIGH (ref 2.6–24.9)

## 2017-12-25 LAB — COMPREHENSIVE METABOLIC PANEL
ALK PHOS: 73 IU/L (ref 39–117)
ALT: 28 IU/L (ref 0–32)
AST: 15 IU/L (ref 0–40)
Albumin/Globulin Ratio: 1.6 (ref 1.2–2.2)
Albumin: 4.2 g/dL (ref 3.5–5.5)
BILIRUBIN TOTAL: 0.3 mg/dL (ref 0.0–1.2)
BUN/Creatinine Ratio: 20 (ref 9–23)
BUN: 14 mg/dL (ref 6–20)
CHLORIDE: 107 mmol/L — AB (ref 96–106)
CO2: 18 mmol/L — ABNORMAL LOW (ref 20–29)
Calcium: 9.7 mg/dL (ref 8.7–10.2)
Creatinine, Ser: 0.71 mg/dL (ref 0.57–1.00)
GFR calc non Af Amer: 108 mL/min/{1.73_m2} (ref >59)
GFR, EST AFRICAN AMERICAN: 125 mL/min/{1.73_m2} (ref >59)
GLUCOSE: 100 mg/dL — AB (ref 65–99)
Globulin, Total: 2.6 g/dL (ref 1.5–4.5)
Potassium: 4.4 mmol/L (ref 3.5–5.2)
Sodium: 139 mmol/L (ref 134–144)
TOTAL PROTEIN: 6.8 g/dL (ref 6.0–8.5)

## 2017-12-25 LAB — CBC WITH DIFFERENTIAL

## 2017-12-25 LAB — LIPID PANEL WITH LDL/HDL RATIO
CHOLESTEROL TOTAL: 165 mg/dL (ref 100–199)
HDL: 34 mg/dL — AB (ref >39)
LDL Calculated: 108 mg/dL — ABNORMAL HIGH (ref 0–99)
LDL/HDL RATIO: 3.2 ratio (ref 0.0–3.2)
TRIGLYCERIDES: 114 mg/dL (ref 0–149)
VLDL Cholesterol Cal: 23 mg/dL (ref 5–40)

## 2017-12-25 LAB — HEMOGLOBIN A1C

## 2017-12-25 LAB — TSH: TSH: 4.45 u[IU]/mL (ref 0.450–4.500)

## 2017-12-25 LAB — T3: T3, Total: 119 ng/dL (ref 71–180)

## 2017-12-25 LAB — FOLATE: FOLATE: 9.2 ng/mL (ref >3.0)

## 2017-12-25 LAB — T4, FREE: Free T4: 1.39 ng/dL (ref 0.82–1.77)

## 2017-12-25 LAB — VITAMIN B12: VITAMIN B 12: 296 pg/mL (ref 232–1245)

## 2018-01-18 ENCOUNTER — Ambulatory Visit (INDEPENDENT_AMBULATORY_CARE_PROVIDER_SITE_OTHER): Payer: 59 | Admitting: Family Medicine

## 2018-01-25 ENCOUNTER — Other Ambulatory Visit (INDEPENDENT_AMBULATORY_CARE_PROVIDER_SITE_OTHER): Payer: Self-pay | Admitting: Family Medicine

## 2018-01-25 DIAGNOSIS — E038 Other specified hypothyroidism: Secondary | ICD-10-CM

## 2018-01-25 MED FILL — VIT D2 1.25 MG (50,000 UNIT: 1.25 MG | 28 days supply | Qty: 4 | Fill #0

## 2018-01-28 ENCOUNTER — Other Ambulatory Visit (INDEPENDENT_AMBULATORY_CARE_PROVIDER_SITE_OTHER): Payer: Self-pay | Admitting: Family Medicine

## 2018-01-28 DIAGNOSIS — E038 Other specified hypothyroidism: Secondary | ICD-10-CM

## 2018-01-28 MED FILL — LEVOTHYROXINE 88 MCG TABLET: 88 | 30 days supply | Qty: 30 | Fill #0

## 2018-01-28 NOTE — Addendum Note (Signed)
Addended by: Renee Ramus on: 01/28/2018 02:35 PM   Modules accepted: Orders

## 2018-01-29 LAB — HM DIABETES EYE EXAM

## 2018-02-08 ENCOUNTER — Ambulatory Visit (INDEPENDENT_AMBULATORY_CARE_PROVIDER_SITE_OTHER): Payer: 59 | Admitting: Family Medicine

## 2018-02-08 VITALS — BP 95/64 | HR 78 | Temp 97.7°F | Ht 67.0 in | Wt 249.0 lb

## 2018-02-08 DIAGNOSIS — E038 Other specified hypothyroidism: Secondary | ICD-10-CM | POA: Diagnosis not present

## 2018-02-08 DIAGNOSIS — Z9189 Other specified personal risk factors, not elsewhere classified: Secondary | ICD-10-CM

## 2018-02-08 DIAGNOSIS — E119 Type 2 diabetes mellitus without complications: Secondary | ICD-10-CM | POA: Diagnosis not present

## 2018-02-08 DIAGNOSIS — Z6839 Body mass index (BMI) 39.0-39.9, adult: Secondary | ICD-10-CM | POA: Diagnosis not present

## 2018-02-08 MED ORDER — GLUCOSE BLOOD VI STRP: 1.0000 | ORAL_STRIP | Freq: Every day | 0 refills | Status: DC

## 2018-02-08 MED ORDER — METFORMIN HCL ER 750 MG PO TB24: 750.0000 mg | ORAL_TABLET | Freq: Every morning | ORAL | 0 refills | Status: DC

## 2018-02-08 MED ORDER — LIRAGLUTIDE 18 MG/3ML ~~LOC~~ SOPN: 1.8000 mg | PEN_INJECTOR | Freq: Every day | SUBCUTANEOUS | 0 refills | Status: DC

## 2018-02-08 MED ORDER — LEVOTHYROXINE SODIUM 88 MCG PO TABS: 88.0000 ug | ORAL_TABLET | Freq: Every day | ORAL | 0 refills | Status: DC

## 2018-02-08 MED FILL — VICTOZA 18 MG/3 ML INJECT P: 18 | 30 days supply | Qty: 9 | Fill #0

## 2018-02-08 MED FILL — METFORMIN HCL ER 750 MG TAB: 750 | 30 days supply | Qty: 30 | Fill #0

## 2018-02-08 MED FILL — ACCU-CHEK GUIDE TEST STRIP: 90 days supply | Qty: 100 | Fill #0

## 2018-02-09 NOTE — Progress Notes (Signed)
Office: [phone]  /  Fax: [phone]   HPI:   Chief Complaint: OBESITY Tammy Mooney is here to discuss her progress with her obesity treatment plan. She is on the  portion control better and make smarter food choices plan and is following her eating plan approximately 25 to 40 % of the time. She states she is walking 20 minutes 2 times per week. Tammy Mooney was 6 weeks ago. She did well maintaining, but she is ready to get back to losing weight.  Her weight is 249 lb (112.9 kg) today and has maintained weight over a period of 6 weeks since her last Mooney. She has lost 11 lbs since starting treatment with Tammy Mooney.  Diabetes II Tammy Mooney has a diagnosis of diabetes type II. She is doing well with diet and Tammy Mooney states fasting BGs range between 90 and 110. Tammy Mooney denies any hypoglycemic episodes. Last A1c was cancelled due to lab error. She has been working on intensive lifestyle modifications including diet, exercise, and weight loss to help control her blood glucose levels.  At risk for cardiovascular disease Tammy Mooney is at a higher than average risk for cardiovascular disease due to obesity and diabetes. She currently denies any chest pain.  Hypothyroid Tammy Mooney has a diagnosis of hypothyroidism. She had been off of her levothyroxine for 1 month and TSH increased. She has restarted levothyroxine last week.. She denies hot or cold intolerance or palpitations, but does admit to ongoing fatigue.  ALLERGIES: Allergies  Allergen Reactions  . Adhesive [Tape] Rash and Other (See Comments)    Plastic tape causes rash, paper tape is okay, tegaderm is ok  . Penicillins Rash    MEDICATIONS: Current Outpatient Medications on File Prior to Mooney  Medication Sig Dispense Refill  . busPIRone (BUSPAR) 5 MG tablet Take 1 tablet (5 mg total) by mouth 2 (two) times daily as needed. 60 tablet 1  . cetirizine (ZYRTEC) 10 MG tablet Take 10 mg by mouth daily.    . chlorhexidine (PERIDEX) 0.12 %  solution Use as directed 15 mLs in the mouth or throat 2 (two) times daily.    . Insulin Pen Needle 32G X 4 MM MISC 1 Package by Does not apply route every morning. 100 each 0  . Vitamin D, Ergocalciferol, (DRISDOL) 50000 units CAPS capsule Take 1 capsule (50,000 Units total) by mouth every 7 (seven) days. 4 capsule 0   No current facility-administered medications on file prior to Mooney.     PAST MEDICAL HISTORY: Past Medical History:  Diagnosis Date  . DM (diabetes mellitus), type 2 (Tammy Mooney)   . Hypothyroidism   . Infertility, female   . PCOS (polycystic ovarian syndrome)   . Varicella     PAST SURGICAL HISTORY: Past Surgical History:  Procedure Laterality Date  . CESAREAN SECTION    . CESAREAN SECTION  07/01/2012   Procedure: CESAREAN SECTION;  Surgeon: Lovenia Kim, MD;  Location: [facility];  Service: Obstetrics;  Laterality: N/A;  Repeat cesarean section with delivery of baby girl at 59. Apgars 9/9.  Marland Kitchen CESAREAN SECTION N/A 10/25/2014   Procedure: Repeat CESAREAN SECTION;  Surgeon: Brien Few, MD;  Location: [facility];  Service: Obstetrics;  Laterality: N/A;  EDD: 10/31/14  . EYE SURGERY     lasic  . HARDWARE REMOVAL Left 06/05/2016   Procedure: HARDWARE REMOVAL;  Surgeon: Margaretha Sheffield, MD;  Location: [facility];  Service: ENT;  Laterality: Left;  HARDWARE REMOVED FROM LEFT MAXILLA  . IMAGE GUIDED SINUS  SURGERY N/A 06/05/2016   Procedure: IMAGE GUIDED SINUS SURGERY;  Surgeon: Margaretha Sheffield, MD;  Location: [facility];  Service: ENT;  Laterality: N/A;  Gave disk to cece on 11-15 kp  . MANDIBLE SURGERY    . MAXILLARY ANTROSTOMY Right 06/05/2016   Procedure: MAXILLARY ANTROSTOMY;  Surgeon: Margaretha Sheffield, MD;  Location: [facility];  Service: ENT;  Laterality: Right;  . NASAL SINUS SURGERY    . SINUSOTOMY Left 06/05/2016   Procedure: SINUSOTOMY VIA CALDWELL LUC;  Surgeon: Margaretha Sheffield, MD;  Location: [facility];  Service: ENT;  Laterality: Left;   Diabetic -- oral meds  . TONSILLECTOMY    . TURBINATE REDUCTION Bilateral 06/05/2016   Procedure: PARTIAL TURBINATE REDUCTION;  Surgeon: Margaretha Sheffield, MD;  Location: [facility];  Service: ENT;  Laterality: Bilateral;    SOCIAL HISTORY: Social History   Tobacco Use  . Smoking status: Never Smoker  . Smokeless tobacco: Never Used  Substance Use Topics  . Alcohol use: No  . Drug use: No    FAMILY HISTORY: Family History  Problem Relation Age of Onset  . Diabetes Mother   . Depression Mother   . Sleep apnea Mother   . Obesity Mother   . Heart attack Father   . Hypertension Father   . Hyperlipidemia Father   . Heart disease Father   . Heart disease Maternal Grandfather   . Diabetes Paternal Grandmother     ROS: Review of Systems  Constitutional: Positive for malaise/fatigue. Negative for weight loss.  Cardiovascular: Negative for chest pain and palpitations.  Endo/Heme/Allergies:       Negative for heat or cold intolerance    PHYSICAL EXAM: Blood pressure 95/64, pulse 78, temperature 97.7 F (36.5 C), temperature source Oral, height 5\' 7"  (1.702 m), weight 249 lb (112.9 kg), SpO2 97 %, unknown if currently breastfeeding. Body mass index is 39 kg/m. Physical Exam  Constitutional: She is oriented to person, place, and time. She appears well-developed and well-nourished.  Cardiovascular: Normal rate.  Pulmonary/Chest: Effort normal.  Musculoskeletal: Normal range of motion.  Neurological: She is oriented to person, place, and time.  Skin: Skin is warm and dry.  Psychiatric: She has a normal mood and affect. Her behavior is normal.  Vitals reviewed.   RECENT LABS AND TESTS: BMET    Component Value Date/Time   NA 139 12/24/2017 0804   K 4.4 12/24/2017 0804   CL 107 (H) 12/24/2017 0804   CO2 18 (L) 12/24/2017 0804   GLUCOSE 100 (H) 12/24/2017 0804   GLUCOSE 104 (H) 07/09/2016 1213   BUN 14 12/24/2017 0804   CREATININE 0.71 12/24/2017 0804   CALCIUM  9.7 12/24/2017 0804   GFRNONAA 108 12/24/2017 0804   GFRAA 125 12/24/2017 0804   Lab Results  Component Value Date   HGBA1C CANCELED 12/24/2017   HGBA1C 5.6 09/03/2017   HGBA1C 5.5 05/12/2017   HGBA1C 6.6 (H) 02/09/2017   HGBA1C 6.1 07/09/2016   Lab Results  Component Value Date   INSULIN 79.9 (H) 12/24/2017   INSULIN 40.1 (H) 09/03/2017   INSULIN 39.3 (H) 05/12/2017   INSULIN 42.1 (H) 02/09/2017   CBC    Component Value Date/Time   WBC CANCELED 12/24/2017 0804   WBC 10.6 (H) 10/26/2014 0545   RBC CANCELED 12/24/2017 0804   RBC 4.47 10/26/2014 0545   HGB CANCELED 12/24/2017 0804   HCT CANCELED 12/24/2017 0804   PLT 394 (H) 02/09/2017 1053   MCV 84 09/03/2017 0848  MCH 28.9 09/03/2017 0848   MCH 26.8 10/26/2014 0545   MCHC 34.3 09/03/2017 0848   MCHC 33.7 10/26/2014 0545   RDW 14.1 09/03/2017 0848   LYMPHSABS CANCELED 12/24/2017 0804   MONOABS 0.5 05/20/2013 0925   EOSABS CANCELED 12/24/2017 0804   BASOSABS CANCELED 12/24/2017 0804   Iron/TIBC/Ferritin/ %Sat No results found for: IRON, TIBC, FERRITIN, IRONPCTSAT Lipid Panel     Component Value Date/Time   CHOL 165 12/24/2017 0804   TRIG 114 12/24/2017 0804   HDL 34 (L) 12/24/2017 0804   CHOLHDL 5 05/20/2013 0925   VLDL 23.4 05/20/2013 0925   LDLCALC 108 (H) 12/24/2017 0804   Hepatic Function Panel     Component Value Date/Time   PROT 6.8 12/24/2017 0804   ALBUMIN 4.2 12/24/2017 0804   AST 15 12/24/2017 0804   ALT 28 12/24/2017 0804   ALKPHOS 73 12/24/2017 0804   BILITOT 0.3 12/24/2017 0804      Component Value Date/Time   TSH 4.450 12/24/2017 0804   TSH 3.910 09/03/2017 0848   TSH 4.460 05/12/2017 0805   Results for LASHAUN, POCH (MRN [id number]) as of 02/09/2018 07:30  Ref. Range 12/24/2017 08:04  Vitamin D, 25-Hydroxy Latest Ref Range: 30.0 - 100.0 ng/mL 37.5   ASSESSMENT AND PLAN: Type 2 diabetes mellitus without complication, without long-term current use of insulin ([name]) - Plan:  liraglutide (VICTOZA) 18 MG/3ML SOPN, metFORMIN (GLUCOPHAGE-XR) 750 MG 24 hr tablet, glucose blood (ACCU-CHEK GUIDE) test strip, levothyroxine (SYNTHROID, LEVOTHROID) 88 MCG tablet  Other specified hypothyroidism  At risk for heart disease  Class 2 severe obesity with serious comorbidity and body mass index (BMI) of 39.0 to 39.9 in adult, unspecified obesity type ([place])  PLAN:  Diabetes II Tajuanna has been given extensive diabetes education by myself today including ideal fasting and post-prandial blood glucose readings, individual ideal Hgb A1c goals  and hypoglycemia prevention. We discussed the importance of good blood sugar control to decrease the likelihood of diabetic complications such as nephropathy, neuropathy, limb loss, blindness, coronary artery disease, and death. We discussed the importance of intensive lifestyle modification including diet, exercise and weight loss as the first line treatment for diabetes. Katanya agrees to continue Victoza 1.8 mg daily #3 pens with no refills and metformin 750 mg daily #30 with no refills and follow up at the agreed upon time. We will refill test strips for 1 month.  Cardiovascular risk counseling Graviela was given extended (15 minutes) coronary artery disease prevention counseling today. She is 37 y.o. female and has risk factors for heart disease including obesity and diabetes. We discussed intensive lifestyle modifications today with an emphasis on specific weight loss instructions and strategies. Pt was also informed of the importance of increasing exercise and decreasing saturated fats to help prevent heart disease.  Hypothyroid Alexx was informed of the importance of good thyroid control to help with weight loss efforts. She was also informed that supertheraputic thyroid levels are dangerous and will not improve weight loss results. Ilina agrees to continue levothyroxine 88 mcg qd #30 with no refills and follow up as  directed.  Obesity Aubrianna is currently in the action stage of change. As such, her goal is to continue with weight loss efforts She has agreed to change to keeping a food journal with 1500 to 1700 calories and 90 grams of protein daily Ara has been instructed to work up to a goal of 150 minutes of combined cardio and strengthening exercise per week for weight loss and  overall health benefits. We discussed the following Behavioral Modification Strategies today: increasing lean protein intake, decreasing simple carbohydrates  and work on meal planning and easy cooking plans  Lashawnta has agreed to follow up with our clinic in 2 weeks with Linus Orn our PA and we will recheck labs next month. She was informed of the importance of frequent follow up visits to maximize her success with intensive lifestyle modifications for her multiple health conditions.   OBESITY BEHAVIORAL INTERVENTION Mooney  Today's Mooney was # 21 out of 22.  Starting weight: 260 lbs Starting date: 02/09/17 Today's weight : 249 lbs Today's date: 02/08/2018 Total lbs lost to date: 62    ASK: We discussed the diagnosis of obesity with Lorenza Cambridge today and Krithi agreed to give Tammy Mooney permission to discuss obesity behavioral modification therapy today.  ASSESS: Kooper has the diagnosis of obesity and her BMI today is 38.99 Lupita is in the action stage of change   ADVISE: Desmond was educated on the multiple health risks of obesity as well as the benefit of weight loss to improve her health. She was advised of the need for long term treatment and the importance of lifestyle modifications.  AGREE: Multiple dietary modification options and treatment options were discussed and  Onyx agreed to the above obesity treatment plan.  I, Doreene Nest, am acting as transcriptionist for Dennard Nip, MD  I have reviewed the above documentation for accuracy and completeness, and I agree with the above. -Dennard Nip,  MD

## 2018-02-11 NOTE — Progress Notes (Signed)
Tammy Mooney is a 37 y.o. female is here for follow up.  History of Present Illness:   HPI:   1. Primary hypothyroidism. Restarted medication after last visit with [name]. Due for lab recheck.   2. Type 2 diabetes mellitus without complication, without long-term current use of insulin ([place]). Generally, A1c is controlled. Insulin level high. On Metformin and Victoza. Compliant: yes. Side effects: no.    3. Anxiety. Not sleeping well. Hard to fall asleep. Husband in job transition. Three great kids but busy. Work is stressful but nothing new. Frustrated that weight loss has stalled but felt energized after last visit with [name]. Interested in daily treatment.   4. B12 deficiency. Not on supplementation. On metformin, which will decrease B12.   5. Dyslipidemia associated with type 2 diabetes mellitus ([place]). Mildly elevated LDL but low HDL. Not on statin.    Health Maintenance Due  Topic Date Due  . URINE MICROALBUMIN  01/02/2017  . OPHTHALMOLOGY EXAM  12/18/2017   Depression screen PHQ 2/9 02/12/2018 02/09/2017  Decreased Interest 1 2  Down, Depressed, Hopeless 1 2  PHQ - 2 Score 2 4  Altered sleeping 2 1  Tired, decreased energy 1 3  Change in appetite 0 2  Feeling bad or failure about yourself  1 3  Trouble concentrating 1 0  Moving slowly or fidgety/restless 0 0  Suicidal thoughts 0 0  PHQ-9 Score 7 13  Difficult doing work/chores Somewhat difficult -   PMHx, SurgHx, SocialHx, FamHx, Medications, and Allergies were reviewed in the Visit Navigator and updated as appropriate.   Patient Active Problem List   Diagnosis Date Noted  . Other insomnia 09/17/2017  . At risk for osteopenia 04/22/2017  . Vitamin D deficiency 02/23/2017  . Dyslipidemia associated with type 2 diabetes mellitus ([place]) 02/23/2017  . Type 2 diabetes mellitus without complication, without long-term current use of insulin ([place]) 02/09/2017  . Allergic rhinitis 01/03/2016  . Primary  hypothyroidism 11/13/2015  . Anxiety 11/13/2015  . PCOS (polycystic ovarian syndrome) 09/25/2011   Social History   Tobacco Use  . Smoking status: Never Smoker  . Smokeless tobacco: Never Used  Substance Use Topics  . Alcohol use: No  . Drug use: No   Current Medications and Allergies:   Current Outpatient Medications:  .  busPIRone (BUSPAR) 5 MG tablet, Take 1 tablet (5 mg total) by mouth 2 (two) times daily as needed., Disp: 60 tablet, Rfl: 1 .  cetirizine (ZYRTEC) 10 MG tablet, Take 10 mg by mouth daily., Disp: , Rfl:  .  chlorhexidine (PERIDEX) 0.12 % solution, Use as directed 15 mLs in the mouth or throat 2 (two) times daily., Disp: , Rfl:  .  glucose blood (ACCU-CHEK GUIDE) test strip, 1 each by Other route daily. Use as instructed, Disp: 100 each, Rfl: 0 .  Insulin Pen Needle 32G X 4 MM MISC, 1 Package by Does not apply route every morning., Disp: 100 each, Rfl: 0 .  levothyroxine (SYNTHROID, LEVOTHROID) 88 MCG tablet, Take 1 tablet (88 mcg total) by mouth daily before breakfast., Disp: 30 tablet, Rfl: 0 .  liraglutide (VICTOZA) 18 MG/3ML SOPN, Inject 0.3 mLs (1.8 mg total) into the skin daily., Disp: 3 pen, Rfl: 0 .  metFORMIN (GLUCOPHAGE-XR) 750 MG 24 hr tablet, Take 1 tablet (750 mg total) by mouth every morning. TAKE 1 TABLETS BY MOUTH DAILY WITH BREAKFAST., Disp: 30 tablet, Rfl: 0 .  Vitamin D, Ergocalciferol, (DRISDOL) 50000 units CAPS capsule,  Take 1 capsule (50,000 Units total) by mouth every 7 (seven) days., Disp: 4 capsule, Rfl: 0    Allergies  Allergen Reactions  . Adhesive [Tape] Rash and Other (See Comments)    Plastic tape causes rash, paper tape is okay, tegaderm is ok  . Penicillins Rash   Review of Systems   Pertinent items are noted in the HPI. Otherwise, ROS is negative.  Vitals:   Vitals:   02/12/18 0718  BP: 100/62  Pulse: 94  Temp: (!) 97.5 F (36.4 C)  TempSrc: Oral  SpO2: 98%  Weight: 253 lb 9.6 oz (115 kg)  Height: 5\' 7"  (1.702 m)       Body mass index is 39.72 kg/m.  Physical Exam:   Physical Exam  Constitutional: She appears well-nourished.  HENT:  Head: Normocephalic and atraumatic.  Eyes: Pupils are equal, round, and reactive to light. EOM are normal.  Neck: Normal range of motion. Neck supple.  Cardiovascular: Normal rate, regular rhythm, normal heart sounds and intact distal pulses.  Pulmonary/Chest: Effort normal.  Abdominal: Soft.  Skin: Skin is warm.  Psychiatric: She has a normal mood and affect. Her behavior is normal.  Nursing note and vitals reviewed.  Assessment and Plan:   Tammy Mooney was seen today for follow-up.  Diagnoses and all orders for this visit:  Primary hypothyroidism Comments: Goal TSH 1-2. Will call in Rx once labs are back. She has been compliant with medication x 1 month now.  Orders: -     TSH -     T4, free  Type 2 diabetes mellitus without complication, without long-term current use of insulin ([name]) -     Microalbumin / creatinine urine ratio -     CBC with Differential/Platelet -     Hemoglobin A1c  Anxiety Comments: Lexapro provided. Wait one week to see how B12 and trazodone are helping and to see if thyroid at goal.  Orders: -     escitalopram (LEXAPRO) 10 MG tablet; Take 1 tablet (10 mg total) by mouth daily.  B12 deficiency Comments: Will trial supplementation to see if it helps with energy. Orders: -     cyanocobalamin (,VITAMIN B-12,) 1000 MCG/ML injection; 1000 mcg (1 mg) injection once per week for four weeks, followed by 1000 mcg injection once per month. -     cyanocobalamin ((VITAMIN B-12)) injection 1,000 mcg -     Syringe/Needle, Disp, (SYRINGE 3CC/25GX1") 25G X 1" 3 ML MISC; 1 application by Does not apply route once a week.  Dyslipidemia associated with type 2 diabetes mellitus ([place]) Comments: No statin.   Other insomnia Comments: Trial Trazodone. Orders: -     traZODone (DESYREL) 50 MG tablet; Take 0.5-1 tablets (25-50 mg total) by mouth at  bedtime as needed for sleep.    . Reviewed expectations re: course of current medical issues. . Discussed self-management of symptoms. . Outlined signs and symptoms indicating need for more acute intervention. . Patient verbalized understanding and all questions were answered. Marland Kitchen Health Maintenance issues including appropriate healthy diet, exercise, and smoking avoidance were discussed with patient. . See orders for this visit as documented in the electronic medical record. . Patient received an After Visit Summary.  Briscoe Deutscher, DO [place], [facility] 02/12/2018  Future Appointments  Date Time Provider [place]  02/23/2018  4:30 PM Abby Potash, PA-C [facility] None  07/12/2018  7:00 AM Briscoe Deutscher, DO [facility]

## 2018-02-12 ENCOUNTER — Encounter: Payer: Self-pay | Admitting: Family Medicine

## 2018-02-12 ENCOUNTER — Ambulatory Visit (INDEPENDENT_AMBULATORY_CARE_PROVIDER_SITE_OTHER): Payer: 59 | Admitting: Family Medicine

## 2018-02-12 VITALS — BP 100/62 | HR 94 | Temp 97.5°F | Ht 67.0 in | Wt 253.6 lb

## 2018-02-12 DIAGNOSIS — G4709 Other insomnia: Secondary | ICD-10-CM | POA: Diagnosis not present

## 2018-02-12 DIAGNOSIS — E119 Type 2 diabetes mellitus without complications: Secondary | ICD-10-CM

## 2018-02-12 DIAGNOSIS — E785 Hyperlipidemia, unspecified: Secondary | ICD-10-CM | POA: Diagnosis not present

## 2018-02-12 DIAGNOSIS — E039 Hypothyroidism, unspecified: Secondary | ICD-10-CM

## 2018-02-12 DIAGNOSIS — E1169 Type 2 diabetes mellitus with other specified complication: Secondary | ICD-10-CM

## 2018-02-12 DIAGNOSIS — F419 Anxiety disorder, unspecified: Secondary | ICD-10-CM | POA: Diagnosis not present

## 2018-02-12 DIAGNOSIS — E538 Deficiency of other specified B group vitamins: Secondary | ICD-10-CM | POA: Diagnosis not present

## 2018-02-12 LAB — T4, FREE: Free T4: 1.19 ng/dL (ref 0.60–1.60)

## 2018-02-12 LAB — CBC WITH DIFFERENTIAL/PLATELET
Basophils Absolute: 0.1 10*3/uL (ref 0.0–0.1)
Basophils Relative: 0.7 % (ref 0.0–3.0)
Eosinophils Absolute: 0.1 10*3/uL (ref 0.0–0.7)
Eosinophils Relative: 1.7 % (ref 0.0–5.0)
HCT: 42.8 % (ref 36.0–46.0)
Hemoglobin: 14.8 g/dL (ref 12.0–15.0)
Lymphocytes Relative: 26.7 % (ref 12.0–46.0)
Lymphs Abs: 2.3 10*3/uL (ref 0.7–4.0)
MCHC: 34.6 g/dL (ref 30.0–36.0)
MCV: 85 fl (ref 78.0–100.0)
Monocytes Absolute: 0.4 10*3/uL (ref 0.1–1.0)
Monocytes Relative: 4.1 % (ref 3.0–12.0)
Neutro Abs: 5.8 10*3/uL (ref 1.4–7.7)
Neutrophils Relative %: 66.8 % (ref 43.0–77.0)
Platelets: 430 10*3/uL — ABNORMAL HIGH (ref 150.0–400.0)
RBC: 5.03 Mil/uL (ref 3.87–5.11)
RDW: 14.1 % (ref 11.5–15.5)
WBC: 8.7 10*3/uL (ref 4.0–10.5)

## 2018-02-12 LAB — MICROALBUMIN / CREATININE URINE RATIO
Creatinine,U: 133.2 mg/dL
Microalb Creat Ratio: 2 mg/g (ref 0.0–30.0)
Microalb, Ur: 2.6 mg/dL — ABNORMAL HIGH (ref 0.0–1.9)

## 2018-02-12 LAB — TSH: TSH: 4.33 u[IU]/mL (ref 0.35–4.50)

## 2018-02-12 LAB — HEMOGLOBIN A1C: Hgb A1c MFr Bld: 5.9 % (ref 4.6–6.5)

## 2018-02-12 MED ORDER — "SYRINGE 25G X 1"" 3 ML MISC": 1.0000 "application " | 0 refills | Status: DC

## 2018-02-12 MED ORDER — CYANOCOBALAMIN 1000 MCG/ML IJ SOLN: INTRAMUSCULAR | 15 refills | Status: DC

## 2018-02-12 MED ORDER — LEVOTHYROXINE SODIUM 100 MCG PO TABS
100.0000 ug | ORAL_TABLET | Freq: Every day | ORAL | 3 refills | Status: DC
Start: 2018-02-12 — End: 2018-07-19

## 2018-02-12 MED ORDER — CYANOCOBALAMIN 1000 MCG/ML IJ SOLN
1000.0000 ug | Freq: Once | INTRAMUSCULAR | Status: AC
  Administered 2018-02-12: 1000 ug via INTRAMUSCULAR

## 2018-02-12 MED ORDER — TRAZODONE HCL 50 MG PO TABS: 25.0000 mg | ORAL_TABLET | Freq: Every evening | ORAL | 3 refills | Status: DC | PRN

## 2018-02-12 MED ORDER — ESCITALOPRAM OXALATE 10 MG PO TABS: 10.0000 mg | ORAL_TABLET | Freq: Every day | ORAL | 1 refills | Status: DC

## 2018-02-12 MED FILL — BD 3 ML SYRINGE 25GX1: 25G X 1" | 84 days supply | Qty: 12 | Fill #0

## 2018-02-12 MED FILL — ESCITALOPRAM 10 MG TABLET: 10 | 30 days supply | Qty: 30 | Fill #0

## 2018-02-12 MED FILL — LEVOTHYROXINE 100 MCG TABLE: 100 | 90 days supply | Qty: 90 | Fill #0

## 2018-02-12 MED FILL — CYANOCOBALAMIN 1,000 MCG/ML: 1000 | 28 days supply | Qty: 4 | Fill #0

## 2018-02-12 MED FILL — BD 3 ML SYRINGE 25GX1": 25G X 1" | 84 days supply | Qty: 12 | Fill #0

## 2018-02-12 MED FILL — traZODone HCL 50 MG TABS: 50 | 30 days supply | Qty: 30 | Fill #0

## 2018-02-12 NOTE — Addendum Note (Signed)
Addended by: Briscoe Deutscher R on: 02/12/2018 12:03 PM   Modules accepted: Orders

## 2018-02-18 ENCOUNTER — Encounter: Payer: Self-pay | Admitting: Family Medicine

## 2018-02-23 ENCOUNTER — Ambulatory Visit (INDEPENDENT_AMBULATORY_CARE_PROVIDER_SITE_OTHER): Payer: 59 | Admitting: Physician Assistant

## 2018-02-23 VITALS — BP 104/71 | HR 95 | Temp 97.6°F | Ht 67.0 in | Wt 253.0 lb

## 2018-02-23 DIAGNOSIS — Z6839 Body mass index (BMI) 39.0-39.9, adult: Secondary | ICD-10-CM

## 2018-02-23 DIAGNOSIS — E559 Vitamin D deficiency, unspecified: Secondary | ICD-10-CM | POA: Diagnosis not present

## 2018-02-23 DIAGNOSIS — Z9189 Other specified personal risk factors, not elsewhere classified: Secondary | ICD-10-CM | POA: Diagnosis not present

## 2018-02-23 DIAGNOSIS — E119 Type 2 diabetes mellitus without complications: Secondary | ICD-10-CM

## 2018-02-23 MED ORDER — VITAMIN D (ERGOCALCIFEROL) 1.25 MG (50000 UNIT) PO CAPS: 50000.0000 [IU] | ORAL_CAPSULE | ORAL | 0 refills | Status: DC

## 2018-02-24 MED FILL — VIT D2 1.25 MG (50,000 UNIT: 1.25 MG | 28 days supply | Qty: 4 | Fill #0

## 2018-02-25 NOTE — Progress Notes (Signed)
Office: [phone]  /  Fax: [phone]   HPI:   Chief Complaint: OBESITY Tammy Mooney is here to discuss her progress with her obesity treatment plan. She is on the keep a food journal with 1500 to 1700 calories and 90 grams of protein daily and is following her eating plan approximately 50 % of the time. She states she is walking 30 minutes 2 times per week. Tammy Mooney reports that she has struggled with making good food choices while journaling. She states that she is ready to get back on track. Her weight is 253 lb (114.8 kg) today and has had a weight gain of 4 pounds over a period of 2 weeks since her last visit. She has lost 7 lbs since starting treatment with [name].  Diabetes II Tammy Mooney has a diagnosis of diabetes type II. Tammy Mooney denies any hypoglycemic episodes or polyphagia. Last A1c was at 5.9 She has been working on intensive lifestyle modifications including diet, exercise, and weight loss to help control her blood glucose levels. Tammy Mooney denies nausea, vomiting or diarrhea.  Vitamin D deficiency Tammy Mooney has a diagnosis of vitamin D deficiency. She is currently taking prescription vit D and denies nausea, vomiting or muscle weakness.  At risk for osteopenia and osteoporosis Tammy Mooney is at higher risk of osteopenia and osteoporosis due to vitamin D deficiency.   ALLERGIES: Allergies  Allergen Reactions  . Adhesive [Tape] Rash and Other (See Comments)    Plastic tape causes rash, paper tape is okay, tegaderm is ok  . Penicillins Rash    MEDICATIONS: Current Outpatient Medications on File Prior to Visit  Medication Sig Dispense Refill  . busPIRone (BUSPAR) 5 MG tablet Take 1 tablet (5 mg total) by mouth 2 (two) times daily as needed. 60 tablet 1  . cetirizine (ZYRTEC) 10 MG tablet Take 10 mg by mouth daily.    . chlorhexidine (PERIDEX) 0.12 % solution Use as directed 15 mLs in the mouth or throat 2 (two) times daily.    . cyanocobalamin (,VITAMIN B-12,) 1000 MCG/ML injection  1000 mcg (1 mg) injection once per week for four weeks, followed by 1000 mcg injection once per month. 1 mL 15  . escitalopram (LEXAPRO) 10 MG tablet Take 1 tablet (10 mg total) by mouth daily. 30 tablet 1  . glucose blood (ACCU-CHEK GUIDE) test strip 1 each by Other route daily. Use as instructed 100 each 0  . Insulin Pen Needle 32G X 4 MM MISC 1 Package by Does not apply route every morning. 100 each 0  . levothyroxine (SYNTHROID, LEVOTHROID) 100 MCG tablet Take 1 tablet (100 mcg total) by mouth daily. 90 tablet 3  . liraglutide (VICTOZA) 18 MG/3ML SOPN Inject 0.3 mLs (1.8 mg total) into the skin daily. 3 pen 0  . metFORMIN (GLUCOPHAGE-XR) 750 MG 24 hr tablet Take 1 tablet (750 mg total) by mouth every morning. TAKE 1 TABLETS BY MOUTH DAILY WITH BREAKFAST. 30 tablet 0  . Syringe/Needle, Disp, (SYRINGE 3CC/25GX1") 25G X 1" 3 ML MISC 1 application by Does not apply route once a week. 12 each 0  . traZODone (DESYREL) 50 MG tablet Take 0.5-1 tablets (25-50 mg total) by mouth at bedtime as needed for sleep. 30 tablet 3   No current facility-administered medications on file prior to visit.     PAST MEDICAL HISTORY: Past Medical History:  Diagnosis Date  . DM (diabetes mellitus), type 2 ([place])   . Hypothyroidism   . Infertility, female   . PCOS (polycystic ovarian syndrome)   .  Varicella     PAST SURGICAL HISTORY: Past Surgical History:  Procedure Laterality Date  . CESAREAN SECTION    . CESAREAN SECTION  07/01/2012   Procedure: CESAREAN SECTION;  Surgeon: Lovenia Kim, MD;  Location: [facility];  Service: Obstetrics;  Laterality: N/A;  Repeat cesarean section with delivery of baby girl at 29. Apgars 9/9.  Marland Kitchen CESAREAN SECTION N/A 10/25/2014   Procedure: Repeat CESAREAN SECTION;  Surgeon: Brien Few, MD;  Location: [facility];  Service: Obstetrics;  Laterality: N/A;  EDD: 10/31/14  . EYE SURGERY     lasic  . HARDWARE REMOVAL Left 06/05/2016   Procedure: HARDWARE REMOVAL;  Surgeon: Margaretha Sheffield, MD;  Location: [facility];  Service: ENT;  Laterality: Left;  HARDWARE REMOVED FROM LEFT MAXILLA  . IMAGE GUIDED SINUS SURGERY N/A 06/05/2016   Procedure: IMAGE GUIDED SINUS SURGERY;  Surgeon: Margaretha Sheffield, MD;  Location: [facility];  Service: ENT;  Laterality: N/A;  Gave disk to cece on 11-15 kp  . MANDIBLE SURGERY    . MAXILLARY ANTROSTOMY Right 06/05/2016   Procedure: MAXILLARY ANTROSTOMY;  Surgeon: Margaretha Sheffield, MD;  Location: [place] [facility];  Service: ENT;  Laterality: Right;  . NASAL SINUS SURGERY    . SINUSOTOMY Left 06/05/2016   Procedure: SINUSOTOMY VIA CALDWELL LUC;  Surgeon: Margaretha Sheffield, MD;  Location: [place];  Service: ENT;  Laterality: Left;  Diabetic -- oral meds  . TONSILLECTOMY    . TURBINATE REDUCTION Bilateral 06/05/2016   Procedure: PARTIAL TURBINATE REDUCTION;  Surgeon: Margaretha Sheffield, MD;  Location: [place];  Service: ENT;  Laterality: Bilateral;    SOCIAL HISTORY: Social History   Tobacco Use  . Smoking status: Never Smoker  . Smokeless tobacco: Never Used  Substance Use Topics  . Alcohol use: No  . Drug use: No    FAMILY HISTORY: Family History  Problem Relation Age of Onset  . Diabetes Mother   . Depression Mother   . Sleep apnea Mother   . Obesity Mother   . Heart attack Father   . Hypertension Father   . Hyperlipidemia Father   . Heart disease Father   . Heart disease Maternal Grandfather   . Diabetes Paternal Grandmother     ROS: Review of Systems  Constitutional: Negative for weight loss.  Gastrointestinal: Negative for diarrhea, nausea and vomiting.  Musculoskeletal:       Negative for muscle weakness  Endo/Heme/Allergies:       Negative for polyphagia Negative for hypoglycemia    PHYSICAL EXAM: Blood pressure 104/71, pulse 95, temperature 97.6 F (36.4 C), temperature source Oral, height 5\' 7"  (1.702 m), weight 253 lb (114.8 kg), SpO2 96 %, unknown if currently  breastfeeding. Body mass index is 39.63 kg/m. Physical Exam  Constitutional: She is oriented to person, place, and time. She appears well-developed and well-nourished.  Cardiovascular: Normal rate.  Pulmonary/Chest: Effort normal.  Musculoskeletal: Normal range of motion.  Neurological: She is oriented to person, place, and time.  Skin: Skin is warm and dry.  Psychiatric: She has a normal mood and affect. Her behavior is normal.  Vitals reviewed.   RECENT LABS AND TESTS: BMET    Component Value Date/Time   NA 139 12/24/2017 0804   K 4.4 12/24/2017 0804   CL 107 (H) 12/24/2017 0804   CO2 18 (L) 12/24/2017 0804   GLUCOSE 100 (H) 12/24/2017 0804   GLUCOSE 104 (H) 07/09/2016 1213   BUN 14 12/24/2017 0804   CREATININE  0.71 12/24/2017 0804   CALCIUM 9.7 12/24/2017 0804   GFRNONAA 108 12/24/2017 0804   GFRAA 125 12/24/2017 0804   Lab Results  Component Value Date   HGBA1C 5.9 02/12/2018   HGBA1C CANCELED 12/24/2017   HGBA1C 5.6 09/03/2017   HGBA1C 5.5 05/12/2017   HGBA1C 6.6 (H) 02/09/2017   Lab Results  Component Value Date   INSULIN 79.9 (H) 12/24/2017   INSULIN 40.1 (H) 09/03/2017   INSULIN 39.3 (H) 05/12/2017   INSULIN 42.1 (H) 02/09/2017   CBC    Component Value Date/Time   WBC 8.7 02/12/2018 0757   RBC 5.03 02/12/2018 0757   HGB 14.8 02/12/2018 0757   HGB CANCELED 12/24/2017 0804   HCT 42.8 02/12/2018 0757   HCT CANCELED 12/24/2017 0804   PLT 430.0 (H) 02/12/2018 0757   PLT 394 (H) 02/09/2017 1053   MCV 85.0 02/12/2018 0757   MCV 84 09/03/2017 0848   MCH 28.9 09/03/2017 0848   MCH 26.8 10/26/2014 0545   MCHC 34.6 02/12/2018 0757   RDW 14.1 02/12/2018 0757   RDW 14.1 09/03/2017 0848   LYMPHSABS 2.3 02/12/2018 0757   LYMPHSABS CANCELED 12/24/2017 0804   MONOABS 0.4 02/12/2018 0757   EOSABS 0.1 02/12/2018 0757   EOSABS CANCELED 12/24/2017 0804   BASOSABS 0.1 02/12/2018 0757   BASOSABS CANCELED 12/24/2017 0804   Iron/TIBC/Ferritin/ %Sat No results  found for: IRON, TIBC, FERRITIN, IRONPCTSAT Lipid Panel     Component Value Date/Time   CHOL 165 12/24/2017 0804   TRIG 114 12/24/2017 0804   HDL 34 (L) 12/24/2017 0804   CHOLHDL 5 05/20/2013 0925   VLDL 23.4 05/20/2013 0925   LDLCALC 108 (H) 12/24/2017 0804   Hepatic Function Panel     Component Value Date/Time   PROT 6.8 12/24/2017 0804   ALBUMIN 4.2 12/24/2017 0804   AST 15 12/24/2017 0804   ALT 28 12/24/2017 0804   ALKPHOS 73 12/24/2017 0804   BILITOT 0.3 12/24/2017 0804      Component Value Date/Time   TSH 4.33 02/12/2018 0757   TSH 4.450 12/24/2017 0804   TSH 3.910 09/03/2017 0848   Results for PLEASANT, BRITZ (MRN [id number]) as of 02/25/2018 09:05  Ref. Range 12/24/2017 08:04  Vitamin D, 25-Hydroxy Latest Ref Range: 30.0 - 100.0 ng/mL 37.5   ASSESSMENT AND PLAN: Vitamin D deficiency - Plan: Vitamin D, Ergocalciferol, (DRISDOL) 50000 units CAPS capsule  Type 2 diabetes mellitus without complication, without long-term current use of insulin ([name])  At risk for osteoporosis  Class 2 severe obesity with serious comorbidity and body mass index (BMI) of 39.0 to 39.9 in adult, unspecified obesity type ([place])  PLAN:  Diabetes II Cecylia has been given extensive diabetes education by myself today including ideal fasting and post-prandial blood glucose readings, individual ideal Hgb A1c goals and hypoglycemia prevention. We discussed the importance of good blood sugar control to decrease the likelihood of diabetic complications such as nephropathy, neuropathy, limb loss, blindness, coronary artery disease, and death. We discussed the importance of intensive lifestyle modification including diet, exercise and weight loss as the first line treatment for diabetes. Nekeisha agrees to continue her diabetes medications, exercise and weight loss and will follow up at the agreed upon time.  Vitamin D Deficiency Ailie was informed that low vitamin D levels contributes to fatigue and  are associated with obesity, breast, and colon cancer. She agrees to continue to take prescription Vit D @50 ,000 IU every week #4 with no refills and will follow up  for routine testing of vitamin D, at least 2-3 times per year. She was informed of the risk of over-replacement of vitamin D and agrees to not increase her dose unless she discusses this with [name] first. Ellee agrees to follow up as directed.  At risk for osteopenia and osteoporosis Jodene is at risk for osteopenia and osteoporosis due to her vitamin D deficiency. She was encouraged to take her vitamin D and follow her higher calcium diet and increase strengthening exercise to help strengthen her bones and decrease her risk of osteopenia and osteoporosis.  Obesity Yannely is currently in the action stage of change. As such, her goal is to continue with weight loss efforts She has agreed to follow the Category 3 plan Gradie has been instructed to work up to a goal of 150 minutes of combined cardio and strengthening exercise per week for weight loss and overall health benefits. We discussed the following Behavioral Modification Strategies today: increase H2O intake, keeping healthy foods in the home and work on meal planning and easy cooking plans  Viola has agreed to follow up with our clinic in 2 weeks. She was informed of the importance of frequent follow up visits to maximize her success with intensive lifestyle modifications for her multiple health conditions.   OBESITY BEHAVIORAL INTERVENTION VISIT  Today's visit was # 22 out of 22.  Starting weight: 260 lbs Starting date: 02/09/17 Today's weight : 253 lbs Today's date: 02/23/2018 Total lbs lost to date: 7    ASK: We discussed the diagnosis of obesity with Lorenza Cambridge today and Nupur agreed to give [name] permission to discuss obesity behavioral modification therapy today.  ASSESS: Charmian has the diagnosis of obesity and her BMI today is 39.62 Leylanie is in the  action stage of change   ADVISE: Nasreen was educated on the multiple health risks of obesity as well as the benefit of weight loss to improve her health. She was advised of the need for long term treatment and the importance of lifestyle modifications.  AGREE: Multiple dietary modification options and treatment options were discussed and  Patryce agreed to the above obesity treatment plan.  Corey Skains, am acting as transcriptionist for Abby Potash, PA-C I, Abby Potash, PA-C have reviewed above note and agree with its content

## 2018-03-11 ENCOUNTER — Ambulatory Visit (INDEPENDENT_AMBULATORY_CARE_PROVIDER_SITE_OTHER): Payer: Self-pay | Admitting: Physician Assistant

## 2018-03-16 ENCOUNTER — Other Ambulatory Visit: Payer: Self-pay | Admitting: Physician Assistant

## 2018-03-16 ENCOUNTER — Encounter: Payer: Self-pay | Admitting: Family Medicine

## 2018-03-16 DIAGNOSIS — E119 Type 2 diabetes mellitus without complications: Secondary | ICD-10-CM

## 2018-03-16 MED ORDER — METFORMIN HCL ER 750 MG PO TB24: 750.0000 mg | ORAL_TABLET | Freq: Every morning | ORAL | 0 refills | Status: DC

## 2018-03-16 MED FILL — METFORMIN HCL ER 750 MG TAB: 750 | 90 days supply | Qty: 90 | Fill #0

## 2018-03-23 MED FILL — traZODone HCL 50 MG TABS: 50 | 30 days supply | Qty: 30 | Fill #1

## 2018-03-23 MED FILL — CYANOCOBALAMIN 1,000 MCG/ML: 1000 | 84 days supply | Qty: 3 | Fill #0

## 2018-04-01 MED FILL — ESCITALOPRAM 10 MG TABLET: 10 | 30 days supply | Qty: 30 | Fill #1

## 2018-04-13 ENCOUNTER — Ambulatory Visit (INDEPENDENT_AMBULATORY_CARE_PROVIDER_SITE_OTHER): Payer: 59 | Admitting: Family Medicine

## 2018-04-13 ENCOUNTER — Encounter: Payer: Self-pay | Admitting: Family Medicine

## 2018-04-13 VITALS — BP 112/68 | HR 81 | Temp 98.1°F | Ht 67.0 in | Wt 258.2 lb

## 2018-04-13 DIAGNOSIS — J209 Acute bronchitis, unspecified: Secondary | ICD-10-CM | POA: Diagnosis not present

## 2018-04-13 MED ORDER — AZITHROMYCIN 250 MG PO TABS: ORAL_TABLET | ORAL | 0 refills | Status: DC

## 2018-04-13 MED FILL — AZITHROMYCIN 250 MG TABLET: 250 | 5 days supply | Qty: 6 | Fill #0

## 2018-04-13 NOTE — Progress Notes (Signed)
Tammy Mooney is a 37 y.o. female here for an acute visit.  History of Present Illness:   Cough  This is a new problem. The current episode started 1 to 4 weeks ago. The problem has been gradually worsening. The cough is productive of purulent sputum. Pertinent negatives include no chest pain, myalgias, nasal congestion, postnasal drip, rhinorrhea, sore throat or shortness of breath. The symptoms are aggravated by lying down. She has tried rest for the symptoms. Her past medical history is significant for environmental allergies. There is no history of asthma.   PMHx, SurgHx, SocialHx, Medications, and Allergies were reviewed in the Visit Navigator and updated as appropriate.  Current Medications:   .  busPIRone (BUSPAR) 5 MG tablet, Take 1 tablet (5 mg total) by mouth 2 (two) times daily as needed., Disp: 60 tablet, Rfl: 1 .  cetirizine (ZYRTEC) 10 MG tablet, Take 10 mg by mouth daily., Disp: , Rfl:  .  chlorhexidine (PERIDEX) 0.12 % solution, Use as directed 15 mLs in the mouth or throat 2 (two) times daily., Disp: , Rfl:  .  cyanocobalamin (,VITAMIN B-12,) 1000 MCG/ML injection, 1000 mcg (1 mg) injection once per week for four weeks, followed by 1000 mcg injection once per month., Disp: 1 mL, Rfl: 15 .  escitalopram (LEXAPRO) 10 MG tablet, Take 1 tablet (10 mg total) by mouth daily., Disp: 30 tablet, Rfl: 1 .  glucose blood (ACCU-CHEK GUIDE) test strip, 1 each by Other route daily. Use as instructed, Disp: 100 each, Rfl: 0 .  Insulin Pen Needle 32G X 4 MM MISC, 1 Package by Does not apply route every morning., Disp: 100 each, Rfl: 0 .  levothyroxine (SYNTHROID, LEVOTHROID) 100 MCG tablet, Take 1 tablet (100 mcg total) by mouth daily., Disp: 90 tablet, Rfl: 3 .  liraglutide (VICTOZA) 18 MG/3ML SOPN, Inject 0.3 mLs (1.8 mg total) into the skin daily., Disp: 3 pen, Rfl: 0 .  metFORMIN (GLUCOPHAGE-XR) 750 MG 24 hr tablet, Take 1 tablet (750 mg total) by mouth every morning. TAKE 1  TABLETS BY MOUTH DAILY WITH BREAKFAST., Disp: 90 tablet, Rfl: 0 .  Syringe/Needle, Disp, (SYRINGE 3CC/25GX1") 25G X 1" 3 ML MISC, 1 application by Does not apply route once a week., Disp: 12 each, Rfl: 0 .  traZODone (DESYREL) 50 MG tablet, Take 0.5-1 tablets (25-50 mg total) by mouth at bedtime as needed for sleep., Disp: 30 tablet, Rfl: 3 .  Vitamin D, Ergocalciferol, (DRISDOL) 50000 units CAPS capsule, Take 1 capsule (50,000 Units total) by mouth every 7 (seven) days., Disp: 4 capsule, Rfl: 0 .  azithromycin (ZITHROMAX) 250 MG tablet, 2 po on day one, then one po daily until gone, Disp: 6 tablet, Rfl: 0   Allergies  Allergen Reactions  . Adhesive [Tape] Rash and Other (See Comments)    Plastic tape causes rash, paper tape is okay, tegaderm is ok  . Penicillins Rash   Review of Systems:   Pertinent items are noted in the HPI. Otherwise, ROS is negative.  Vitals:   Vitals:   04/13/18 0737  BP: 112/68  Pulse: 81  Temp: 98.1 F (36.7 C)  TempSrc: Oral  SpO2: 96%  Weight: 258 lb 3.2 oz (117.1 kg)  Height: 5\' 7"  (1.702 m)     Body mass index is 40.44 kg/m.  Physical Exam:   Physical Exam  Constitutional: She appears well-nourished.  HENT:  Head: Normocephalic and atraumatic.  Eyes: Pupils are equal, round, and reactive to light. EOM are  normal.  Neck: Normal range of motion. Neck supple.  Cardiovascular: Normal rate, regular rhythm, normal heart sounds and intact distal pulses.  Pulmonary/Chest: Effort normal. She has wheezes in the right middle field and the left middle field. She has rhonchi.  Abdominal: Soft.  Skin: Skin is warm.  Psychiatric: She has a normal mood and affect. Her behavior is normal.  Nursing note and vitals reviewed.  Assessment and Plan:   Tammy Mooney was seen today for cough.  Diagnoses and all orders for this visit:  Bronchitis with bronchospasm -     azithromycin (ZITHROMAX) 250 MG tablet; 2 po on day one, then one po daily until  gone    . Reviewed expectations re: course of current medical issues. . Discussed self-management of symptoms. . Outlined signs and symptoms indicating need for more acute intervention. . Patient verbalized understanding and all questions were answered. Marland Kitchen Health Maintenance issues including appropriate healthy diet, exercise, and smoking avoidance were discussed with patient. . See orders for this visit as documented in the electronic medical record. . Patient received an After Visit Summary.  Briscoe Deutscher, DO [place], [place] [facility] 04/13/2018

## 2018-04-15 ENCOUNTER — Encounter: Payer: Self-pay | Admitting: Family Medicine

## 2018-04-16 MED FILL — traZODone HCL 50 MG TABS: 50 | 30 days supply | Qty: 30 | Fill #2

## 2018-04-20 ENCOUNTER — Other Ambulatory Visit: Payer: Self-pay | Admitting: Surgical

## 2018-04-20 DIAGNOSIS — E119 Type 2 diabetes mellitus without complications: Secondary | ICD-10-CM

## 2018-04-20 MED ORDER — LIRAGLUTIDE 18 MG/3ML ~~LOC~~ SOPN: 1.8000 mg | PEN_INJECTOR | Freq: Every day | SUBCUTANEOUS | 2 refills | Status: DC

## 2018-04-20 MED FILL — VICTOZA 18 MG/3 ML INJECT P: 18 | 30 days supply | Qty: 9 | Fill #0

## 2018-04-21 ENCOUNTER — Encounter: Payer: Self-pay | Admitting: Family Medicine

## 2018-04-21 ENCOUNTER — Ambulatory Visit (INDEPENDENT_AMBULATORY_CARE_PROVIDER_SITE_OTHER): Payer: 59 | Admitting: Family Medicine

## 2018-04-21 VITALS — BP 118/78 | HR 81 | Temp 97.7°F | Ht 67.0 in | Wt 256.4 lb

## 2018-04-21 DIAGNOSIS — E559 Vitamin D deficiency, unspecified: Secondary | ICD-10-CM | POA: Diagnosis not present

## 2018-04-21 DIAGNOSIS — Z9189 Other specified personal risk factors, not elsewhere classified: Secondary | ICD-10-CM | POA: Diagnosis not present

## 2018-04-21 DIAGNOSIS — E1169 Type 2 diabetes mellitus with other specified complication: Secondary | ICD-10-CM | POA: Diagnosis not present

## 2018-04-21 DIAGNOSIS — E119 Type 2 diabetes mellitus without complications: Secondary | ICD-10-CM | POA: Diagnosis not present

## 2018-04-21 DIAGNOSIS — E039 Hypothyroidism, unspecified: Secondary | ICD-10-CM

## 2018-04-21 DIAGNOSIS — R0609 Other forms of dyspnea: Secondary | ICD-10-CM | POA: Diagnosis not present

## 2018-04-21 DIAGNOSIS — Z6841 Body Mass Index (BMI) 40.0 and over, adult: Secondary | ICD-10-CM

## 2018-04-21 DIAGNOSIS — M25561 Pain in right knee: Secondary | ICD-10-CM | POA: Diagnosis not present

## 2018-04-21 DIAGNOSIS — E785 Hyperlipidemia, unspecified: Secondary | ICD-10-CM | POA: Diagnosis not present

## 2018-04-21 DIAGNOSIS — G8929 Other chronic pain: Secondary | ICD-10-CM

## 2018-04-21 NOTE — Progress Notes (Signed)
Tammy Mooney is a 37 y.o. female is here for follow up.  History of Present Illness:   Tammy Mooney CMA acting as scribe for Dr. Juleen Mooney.  HPI: Patient comes in today for a consult about the gastric sleeve. Patient has been seeing Dr. Leafy Mooney for over a year for weight loss. She is now thinking that she wants to have surgery. She has a lot of questions to go over today.   Health Maintenance Due  Topic Date Due  . OPHTHALMOLOGY EXAM  12/18/2017   Depression screen PHQ 2/9 02/12/2018 02/09/2017  Decreased Interest 1 2  Down, Depressed, Hopeless 1 2  PHQ - 2 Score 2 4  Altered sleeping 2 1  Tired, decreased energy 1 3  Change in appetite 0 2  Feeling bad or failure about yourself  1 3  Trouble concentrating 1 0  Moving slowly or fidgety/restless 0 0  Suicidal thoughts 0 0  PHQ-9 Score 7 13  Difficult doing work/chores Somewhat difficult -   PMHx, SurgHx, SocialHx, FamHx, Medications, and Allergies were reviewed in the Visit Navigator and updated as appropriate.   Patient Active Problem List   Diagnosis Date Noted  . Chronic pain of right knee 04/22/2018  . Other insomnia 09/17/2017  . At risk for osteopenia 04/22/2017  . Vitamin D deficiency 02/23/2017  . Dyslipidemia associated with type 2 diabetes mellitus ([place]) 02/23/2017  . Type 2 diabetes mellitus without complication, without long-term current use of insulin ([name]) 02/09/2017  . Class 3 severe obesity with serious comorbidity and body mass index (BMI) of 40.0 to 44.9 in adult ([name]) 02/09/2017  . Allergic rhinitis 01/03/2016  . Primary hypothyroidism 11/13/2015  . Anxiety 11/13/2015  . PCOS (polycystic ovarian syndrome) 09/25/2011   Social History   Tobacco Use  . Smoking status: Never Smoker  . Smokeless tobacco: Never Used  Substance Use Topics  . Alcohol use: No  . Drug use: No   Current Medications and Allergies:   .  busPIRone (BUSPAR) 5 MG tablet, Take 1 tablet (5 mg total) by mouth 2 (two) times  daily as needed., Disp: 60 tablet, Rfl: 1 .  cetirizine (ZYRTEC) 10 MG tablet, Take 10 mg by mouth daily., Disp: , Rfl:  .  cyanocobalamin (,VITAMIN B-12,) 1000 MCG/ML injection, 1000 mcg (1 mg) injection once per week for four weeks, followed by 1000 mcg injection once per month., Disp: 1 mL, Rfl: 15 .  escitalopram (LEXAPRO) 10 MG tablet, Take 1 tablet (10 mg total) by mouth daily., Disp: 30 tablet, Rfl: 1 .  levothyroxine (SYNTHROID, LEVOTHROID) 100 MCG tablet, Take 1 tablet (100 mcg total) by mouth daily., Disp: 90 tablet, Rfl: 3 .  liraglutide (VICTOZA) 18 MG/3ML SOPN, Inject 0.3 mLs (1.8 mg total) into the skin daily., Disp: 3 pen, Rfl: 2 .  metFORMIN (GLUCOPHAGE-XR) 750 MG 24 hr tablet, Take 1 tablet (750 mg total) by mouth every morning. TAKE 1 TABLETS BY MOUTH DAILY WITH BREAKFAST., Disp: 90 tablet, Rfl: 0 .  traZODone (DESYREL) 50 MG tablet, Take 0.5-1 tablets (25-50 mg total) by mouth at bedtime as needed for sleep., Disp: 30 tablet, Rfl: 3 .  Vitamin D, Ergocalciferol, (DRISDOL) 50000 units CAPS capsule, Take 1 capsule (50,000 Units total) by mouth every 7 (seven) days., Disp: 4 capsule, Rfl: 0   Allergies  Allergen Reactions  . Adhesive [Tape] Rash and Other (See Comments)    Plastic tape causes rash, paper tape is okay, tegaderm is ok  . Penicillins Rash  Review of Systems   Pertinent items are noted in the HPI. Otherwise, ROS is negative.  Vitals:   Vitals:   04/21/18 1500  BP: 118/78  Pulse: 81  Temp: 97.7 F (36.5 C)  TempSrc: Oral  SpO2: 97%  Weight: 256 lb 6.4 oz (116.3 kg)  Height: 5\' 7"  (1.702 m)     Body mass index is 40.16 kg/m.  Physical Exam:   Physical Exam  Constitutional: She appears well-nourished.  HENT:  Head: Normocephalic and atraumatic.  Eyes: Pupils are equal, round, and reactive to light. EOM are normal.  Neck: Normal range of motion. Neck supple.  Cardiovascular: Normal rate, regular rhythm, normal heart sounds and intact distal  pulses.  Pulmonary/Chest: Effort normal.  Abdominal: Soft.  Skin: Skin is warm.  Psychiatric: She has a normal mood and affect. Her behavior is normal.  Nursing note and vitals reviewed.  Results for orders placed or performed in visit on 02/12/18  Microalbumin / creatinine urine ratio  Result Value Ref Range   Microalb, Ur 2.6 (H) 0.0 - 1.9 mg/dL   Creatinine,U 133.2 mg/dL   Microalb Creat Ratio 2.0 0.0 - 30.0 mg/g  CBC with Differential/Platelet  Result Value Ref Range   WBC 8.7 4.0 - 10.5 K/uL   RBC 5.03 3.87 - 5.11 Mil/uL   Hemoglobin 14.8 12.0 - 15.0 g/dL   HCT 42.8 36.0 - 46.0 %   MCV 85.0 78.0 - 100.0 fl   MCHC 34.6 30.0 - 36.0 g/dL   RDW 14.1 11.5 - 15.5 %   Platelets 430.0 (H) 150.0 - 400.0 K/uL   Neutrophils Relative % 66.8 43.0 - 77.0 %   Lymphocytes Relative 26.7 12.0 - 46.0 %   Monocytes Relative 4.1 3.0 - 12.0 %   Eosinophils Relative 1.7 0.0 - 5.0 %   Basophils Relative 0.7 0.0 - 3.0 %   Neutro Abs 5.8 1.4 - 7.7 K/uL   Lymphs Abs 2.3 0.7 - 4.0 K/uL   Monocytes Absolute 0.4 0.1 - 1.0 K/uL   Eosinophils Absolute 0.1 0.0 - 0.7 K/uL   Basophils Absolute 0.1 0.0 - 0.1 K/uL  Hemoglobin A1c  Result Value Ref Range   Hgb A1c MFr Bld 5.9 4.6 - 6.5 %  TSH  Result Value Ref Range   TSH 4.33 0.35 - 4.50 uIU/mL  T4, free  Result Value Ref Range   Free T4 1.19 0.60 - 1.60 ng/dL   Lab Results  Component Value Date   CHOL 165 12/24/2017   HDL 34 (L) 12/24/2017   LDLCALC 108 (H) 12/24/2017   TRIG 114 12/24/2017   CHOLHDL 5 05/20/2013   EKG: normal EKG, normal sinus rhythm.   Assessment and Plan:   [place] was seen today for obesity.  Diagnoses and all orders for this visit:  Dyspnea on exertion -     EKG 12-Lead  Dyslipidemia associated with type 2 diabetes mellitus ([name])  Type 2 diabetes mellitus without complication, without long-term current use of insulin ([name])  Vitamin D deficiency  Primary hypothyroidism  Class 3 severe obesity with serious  comorbidity and body mass index (BMI) of 40.0 to 44.9 in adult, unspecified obesity type ([name]) -     Amb Referral to Bariatric Surgery -     DG UGI  W/KUB  At risk for heart disease  Chronic pain of right knee Comments: Previous evaluation by Orthopedics. MRI available.    . Reviewed expectations re: course of current medical issues. . Discussed self-management of symptoms. Marland Kitchen  Outlined signs and symptoms indicating need for more acute intervention. . Patient verbalized understanding and all questions were answered. Marland Kitchen Health Maintenance issues including appropriate healthy diet, exercise, and smoking avoidance were discussed with patient. . See orders for this visit as documented in the electronic medical record. . Patient received an After Visit Summary.  CMA served as Education administrator during this visit. History, Physical, and Plan performed by medical provider. The above documentation has been reviewed and is accurate and complete. Briscoe Deutscher, D.O.    Briscoe Deutscher, DO [place], [place] 04/22/2018  Date: 04/22/2018   Doctor's Name: Briscoe Deutscher, D.O. [place], [facility]  Re: Kamali Sakata DoB: 1981-01-03  To Whom It May Concern:  The above named patient has been seen by our office since 02/12/18. She suffers from the following co-morbidities: Diagnoses of Dyslipidemia associated with type 2 diabetes mellitus ([place]), Type 2 diabetes mellitus without complication, without long-term current use of insulin ([place]), Vitamin D deficiency, Primary hypothyroidism, Class 3 severe obesity with serious comorbidity and body mass index (BMI) of 40.0 to 44.9 in adult, unspecified obesity type ([place]), At risk for heart disease, and Chronic pain of right knee were also pertinent to this visit.   Her current vitals.   Today's Vitals   04/21/18 1500  BP: 118/78  Pulse: 81  Temp: 97.7 F (36.5 C)  TempSrc: Oral  SpO2: 97%  Weight: 256 lb 6.4 oz (116.3 kg)  Height: 5'  7" (1.702 m)  Body mass index is 40.16 kg/m.  The patient has undergone the following weight loss attempts: Weight Watchers, Slim Fast, Ketogenic Diet, Low Fat Diet, Medical Assisted Weight Loss, Nutrisystem.   I feel this patient would benefit from wight loss surgery because her medical conditions will become life threatining if she does not get her weight under control.   I appreciate your consideration. Please contact me for further questions.   Briscoe Deutscher, D.O. [place], [facility]

## 2018-04-22 ENCOUNTER — Encounter: Payer: Self-pay | Admitting: Family Medicine

## 2018-04-22 DIAGNOSIS — M25561 Pain in right knee: Secondary | ICD-10-CM

## 2018-04-22 DIAGNOSIS — G8929 Other chronic pain: Secondary | ICD-10-CM | POA: Insufficient documentation

## 2018-05-03 ENCOUNTER — Encounter: Payer: Self-pay | Admitting: *Deleted

## 2018-05-03 ENCOUNTER — Telehealth: Payer: Self-pay | Admitting: *Deleted

## 2018-05-03 NOTE — Telephone Encounter (Signed)
-----   Message from Briscoe Deutscher, DO sent at 04/22/2018 12:13 PM EDT ----- Please print the visit note and letter at the bottom. Fax to [facility].

## 2018-05-03 NOTE — Telephone Encounter (Signed)
Office visit note and letter faxed to [facility] [phone].

## 2018-05-11 ENCOUNTER — Other Ambulatory Visit: Payer: Self-pay | Admitting: Family Medicine

## 2018-05-11 DIAGNOSIS — J209 Acute bronchitis, unspecified: Secondary | ICD-10-CM

## 2018-05-12 ENCOUNTER — Other Ambulatory Visit: Payer: Self-pay | Admitting: Family Medicine

## 2018-05-12 DIAGNOSIS — F419 Anxiety disorder, unspecified: Secondary | ICD-10-CM

## 2018-05-12 MED FILL — LEVOTHYROXINE 100 MCG TABLE: 100 | 90 days supply | Qty: 90 | Fill #1

## 2018-05-12 MED FILL — ESCITALOPRAM 10 MG TABLET: 10 | 30 days supply | Qty: 30 | Fill #0

## 2018-05-28 MED FILL — traZODone HCL 50 MG TABS: 50 | 30 days supply | Qty: 30 | Fill #3

## 2018-06-09 DIAGNOSIS — E039 Hypothyroidism, unspecified: Secondary | ICD-10-CM | POA: Diagnosis not present

## 2018-06-09 DIAGNOSIS — E1169 Type 2 diabetes mellitus with other specified complication: Secondary | ICD-10-CM | POA: Diagnosis not present

## 2018-06-09 DIAGNOSIS — E786 Lipoprotein deficiency: Secondary | ICD-10-CM | POA: Diagnosis not present

## 2018-06-14 MED FILL — ESCITALOPRAM 10 MG TABLET: 10 | 30 days supply | Qty: 30 | Fill #1

## 2018-06-15 ENCOUNTER — Other Ambulatory Visit: Payer: Self-pay

## 2018-06-15 DIAGNOSIS — E119 Type 2 diabetes mellitus without complications: Secondary | ICD-10-CM

## 2018-06-15 MED ORDER — METFORMIN HCL ER 750 MG PO TB24: 750.0000 mg | ORAL_TABLET | Freq: Every morning | ORAL | 3 refills | Status: DC

## 2018-06-15 MED FILL — METFORMIN HCL ER 750 MG TAB: 750 | 90 days supply | Qty: 90 | Fill #0

## 2018-07-08 ENCOUNTER — Other Ambulatory Visit: Payer: Self-pay | Admitting: Family Medicine

## 2018-07-08 DIAGNOSIS — G4709 Other insomnia: Secondary | ICD-10-CM

## 2018-07-08 MED FILL — traZODone HCL 50 MG TABS: 50 | 30 days supply | Qty: 30 | Fill #0

## 2018-07-12 ENCOUNTER — Other Ambulatory Visit (HOSPITAL_COMMUNITY): Payer: Self-pay | Admitting: General Surgery

## 2018-07-12 ENCOUNTER — Encounter: Payer: 59 | Admitting: Family Medicine

## 2018-07-12 ENCOUNTER — Other Ambulatory Visit: Payer: Self-pay | Admitting: General Surgery

## 2018-07-18 NOTE — Progress Notes (Signed)
Subjective:    Tammy Mooney is a 37 y.o. female and is here for a comprehensive physical exam. On Bariatric tract. Thinking about sleeve v bypass. Has scope tomorrow.  Stopped taking Victoza consistently. Taking Metformin with no issues.  Current Outpatient Medications:  .  busPIRone (BUSPAR) 5 MG tablet, Take 1 tablet (5 mg total) by mouth 2 (two) times daily as needed., Disp: 60 tablet, Rfl: 1 .  cetirizine (ZYRTEC) 10 MG tablet, Take 10 mg by mouth daily., Disp: , Rfl:  .  cyanocobalamin (,VITAMIN B-12,) 1000 MCG/ML injection, 1000 mcg (1 mg) injection once per week for four weeks, followed by 1000 mcg injection once per month., Disp: 1 mL, Rfl: 15 .  escitalopram (LEXAPRO) 10 MG tablet, TAKE 1 TABLET BY MOUTH DAILY., Disp: 30 tablet, Rfl: 2 .  levothyroxine (SYNTHROID, LEVOTHROID) 100 MCG tablet, Take 1 tablet (100 mcg total) by mouth daily., Disp: 90 tablet, Rfl: 3 .  metFORMIN (GLUCOPHAGE-XR) 750 MG 24 hr tablet, Take 1 tablet (750 mg total) by mouth every morning. TAKE 1 TABLETS BY MOUTH DAILY WITH BREAKFAST., Disp: 90 tablet, Rfl: 3 .  Syringe/Needle, Disp, (SYRINGE 3CC/25GX1") 25G X 1" 3 ML MISC, 1 application by Does not apply route once a week., Disp: 12 each, Rfl: 0 .  traZODone (DESYREL) 50 MG tablet, TAKE 1/2-1 TABLET BY MOUTH AT BEDTIME AS NEEDED FOR SLEEP., Disp: 30 tablet, Rfl: 3 .  Vitamin D, Ergocalciferol, (DRISDOL) 50000 units CAPS capsule, Take 1 capsule (50,000 Units total) by mouth every 7 (seven) days., Disp: 4 capsule, Rfl: 0  There are no preventive care reminders to display for this patient.  PMHx, SurgHx, SocialHx, Medications, and Allergies were reviewed in the Visit Navigator and updated as appropriate.   Past Medical History:  Diagnosis Date  . DM (diabetes mellitus), type 2 ([place])   . Hypothyroidism   . Infertility, female   . Low HDL (under 40)   . PCOS (polycystic ovarian syndrome)   . Varicella      Past Surgical History:  Procedure  Laterality Date  . CESAREAN SECTION    . CESAREAN SECTION  07/01/2012   Procedure: CESAREAN SECTION;  Surgeon: Lovenia Kim, MD;  Location: [facility];  Service: Obstetrics;  Laterality: N/A;  Repeat cesarean section with delivery of baby girl at 37. Apgars 9/9.  Marland Kitchen CESAREAN SECTION N/A 10/25/2014   Procedure: Repeat CESAREAN SECTION;  Surgeon: Brien Few, MD;  Location: [facility];  Service: Obstetrics;  Laterality: N/A;  EDD: 10/31/14  . EYE SURGERY     lasic  . HARDWARE REMOVAL Left 06/05/2016   Procedure: HARDWARE REMOVAL;  Surgeon: Margaretha Sheffield, MD;  Location: [facility];  Service: ENT;  Laterality: Left;  HARDWARE REMOVED FROM LEFT MAXILLA  . IMAGE GUIDED SINUS SURGERY N/A 06/05/2016   Procedure: IMAGE GUIDED SINUS SURGERY;  Surgeon: Margaretha Sheffield, MD;  Location: [facility];  Service: ENT;  Laterality: N/A;  Gave disk to cece on 11-15 kp  . MANDIBLE SURGERY    . MAXILLARY ANTROSTOMY Right 06/05/2016   Procedure: MAXILLARY ANTROSTOMY;  Surgeon: Margaretha Sheffield, MD;  Location: [facility];  Service: ENT;  Laterality: Right;  . NASAL SINUS SURGERY    . SINUSOTOMY Left 06/05/2016   Procedure: SINUSOTOMY VIA CALDWELL LUC;  Surgeon: Margaretha Sheffield, MD;  Location: [place];  Service: ENT;  Laterality: Left;  Diabetic -- oral meds  . TONSILLECTOMY    . TURBINATE REDUCTION Bilateral 06/05/2016   Procedure:  PARTIAL TURBINATE REDUCTION;  Surgeon: Margaretha Sheffield, MD;  Location: [place];  Service: ENT;  Laterality: Bilateral;     Family History  Problem Relation Age of Onset  . Diabetes Mother   . Depression Mother   . Sleep apnea Mother   . Obesity Mother   . Heart attack Father   . Hypertension Father   . Hyperlipidemia Father   . Heart disease Father   . Heart disease Maternal Grandfather   . Diabetes Paternal Grandmother   . Diabetes Brother     Social History   Tobacco Use  . Smoking status: Never Smoker  . Smokeless tobacco: Never Used    Substance Use Topics  . Alcohol use: No  . Drug use: No    Review of Systems:   Pertinent items are noted in the HPI. Otherwise, ROS is negative.  Objective:   BP 108/60   Pulse 74   Temp 97.7 F (36.5 C) (Oral)   Ht 5\' 6"  (1.676 m)   Wt 261 lb 9.6 oz (118.7 kg)   SpO2 97%   BMI 42.22 kg/m    General appearance: alert, cooperative and appears stated age. Head: normocephalic, without obvious abnormality, atraumatic. Neck: no adenopathy, supple, symmetrical, trachea midline; thyroid not enlarged, symmetric, no tenderness/mass/nodules. Lungs: clear to auscultation bilaterally. Heart: regular rate and rhythm Abdomen: soft, non-tender; no masses,  no organomegaly. Extremities: extremities normal, atraumatic, no cyanosis or edema. Skin: skin color, texture, turgor normal, no rashes or lesions. Lymph: cervical, supraclavicular, and axillary nodes normal; no abnormal inguinal nodes palpated. Neurologic: grossly normal.  Assessment/Plan:   Tammy Mooney was seen today for annual exam.  Diagnoses and all orders for this visit:  Primary hypothyroidism -     TSH -     T4, free  Anxiety Comments: Lexapro provided. Wait one week to see how B12 and trazodone are helping and to see if thyroid at goal.  Orders: -     escitalopram (LEXAPRO) 20 MG tablet; Take 0.5 tablets (10 mg total) by mouth daily.  Type 2 diabetes mellitus without complication, without long-term current use of insulin ([name]) -     Hemoglobin A1c -     metFORMIN (GLUCOPHAGE-XR) 750 MG 24 hr tablet; Take 1 tablet (750 mg total) by mouth every morning. TAKE 1 TABLETS BY MOUTH DAILY WITH BREAKFAST.  Other insomnia Comments: Trial Trazodone. Orders: -     traZODone (DESYREL) 50 MG tablet; Take 1 tablet (50 mg total) by mouth at bedtime.  Other orders -     levothyroxine (SYNTHROID, LEVOTHROID) 100 MCG tablet; Take 1 tablet (100 mcg total) by mouth daily. -     Dulaglutide (TRULICITY) 4.76 LY/6.5KP SOPN; 0.75 mg [place]  q wk x 2 wks , then increase to 1.5 mg [place] if tolerating -     Dulaglutide (TRULICITY) 1.5 TW/6.5KC SOPN; Inject 1.5 mg into the skin once a week.    Patient Counseling:   [x]     Nutrition: Stressed importance of moderation in sodium/caffeine intake, saturated fat and cholesterol, caloric balance, sufficient intake of fresh fruits, vegetables, fiber, calcium, iron, and 1 mg of folate supplement per day (for females capable of pregnancy).   [x]      Stressed the importance of regular exercise.    [x]     Substance Abuse: Discussed cessation/primary prevention of tobacco, alcohol, or other drug use; driving or other dangerous activities under the influence; availability of treatment for abuse.    [x]      Injury  prevention: Discussed safety belts, safety helmets, smoke detector, smoking near bedding or upholstery.    [x]      Sexuality: Discussed sexually transmitted diseases, partner selection, use of condoms, avoidance of unintended pregnancy  and contraceptive alternatives.    [x]     Dental health: Discussed importance of regular tooth brushing, flossing, and dental visits.   [x]      Health maintenance and immunizations reviewed. Please refer to Health maintenance section.   Briscoe Deutscher, DO [place]

## 2018-07-19 ENCOUNTER — Encounter: Payer: Self-pay | Admitting: Family Medicine

## 2018-07-19 ENCOUNTER — Ambulatory Visit (INDEPENDENT_AMBULATORY_CARE_PROVIDER_SITE_OTHER): Payer: 59 | Admitting: Family Medicine

## 2018-07-19 VITALS — BP 108/60 | HR 74 | Temp 97.7°F | Ht 66.0 in | Wt 261.6 lb

## 2018-07-19 DIAGNOSIS — F419 Anxiety disorder, unspecified: Secondary | ICD-10-CM

## 2018-07-19 DIAGNOSIS — G4709 Other insomnia: Secondary | ICD-10-CM | POA: Diagnosis not present

## 2018-07-19 DIAGNOSIS — E119 Type 2 diabetes mellitus without complications: Secondary | ICD-10-CM | POA: Diagnosis not present

## 2018-07-19 DIAGNOSIS — E039 Hypothyroidism, unspecified: Secondary | ICD-10-CM | POA: Diagnosis not present

## 2018-07-19 LAB — T4, FREE: Free T4: 1.16 ng/dL (ref 0.60–1.60)

## 2018-07-19 LAB — HEMOGLOBIN A1C: Hgb A1c MFr Bld: 6.4 % (ref 4.6–6.5)

## 2018-07-19 LAB — TSH: TSH: 3.12 u[IU]/mL (ref 0.35–4.50)

## 2018-07-19 MED ORDER — DULAGLUTIDE 1.5 MG/0.5ML ~~LOC~~ SOAJ: 1.5000 mg | SUBCUTANEOUS | 0 refills | Status: DC

## 2018-07-19 MED ORDER — ESCITALOPRAM OXALATE 20 MG PO TABS: 10.0000 mg | ORAL_TABLET | Freq: Every day | ORAL | 1 refills | Status: DC

## 2018-07-19 MED ORDER — DULAGLUTIDE 0.75 MG/0.5ML ~~LOC~~ SOAJ: SUBCUTANEOUS | 0 refills | Status: DC

## 2018-07-19 MED ORDER — METFORMIN HCL ER 750 MG PO TB24: 750.0000 mg | ORAL_TABLET | Freq: Every morning | ORAL | 3 refills | Status: DC

## 2018-07-19 MED ORDER — LEVOTHYROXINE SODIUM 100 MCG PO TABS: 100.0000 ug | ORAL_TABLET | Freq: Every day | ORAL | 3 refills | Status: DC

## 2018-07-19 MED ORDER — TRAZODONE HCL 50 MG PO TABS: 50.0000 mg | ORAL_TABLET | Freq: Every day | ORAL | 2 refills | Status: DC

## 2018-07-19 MED FILL — TRULICITY 1.5 MG/0.5 ML PEN: 1.5 | 28 days supply | Qty: 2 | Fill #0

## 2018-07-19 MED FILL — ESCITALOPRAM 20 MG TABLET: 20 | 90 days supply | Qty: 45 | Fill #0

## 2018-07-19 NOTE — Patient Instructions (Signed)
Health Maintenance, Female Adopting a healthy lifestyle and getting preventive care can go a long way to promote health and wellness. Talk with your health care provider about what schedule of regular examinations is right for you. This is a good chance for you to check in with your provider about disease prevention and staying healthy. In between checkups, there are plenty of things you can do on your own. Experts have done a lot of research about which lifestyle changes and preventive measures are most likely to keep you healthy. Ask your health care provider for more information. Weight and diet Eat a healthy diet  Be sure to include plenty of vegetables, fruits, low-fat dairy products, and lean protein.  Do not eat a lot of foods high in solid fats, added sugars, or salt.  Get regular exercise. This is one of the most important things you can do for your health. ? Most adults should exercise for at least 150 minutes each week. The exercise should increase your heart rate and make you sweat (moderate-intensity exercise). ? Most adults should also do strengthening exercises at least twice a week. This is in addition to the moderate-intensity exercise. Maintain a healthy weight  Body mass index (BMI) is a measurement that can be used to identify possible weight problems. It estimates body fat based on height and weight. Your health care provider can help determine your BMI and help you achieve or maintain a healthy weight.  For females 5 years of age and older: ? A BMI below 18.5 is considered underweight. ? A BMI of 18.5 to 24.9 is normal. ? A BMI of 25 to 29.9 is considered overweight. ? A BMI of 30 and above is considered obese. Watch levels of cholesterol and blood lipids  You should start having your blood tested for lipids and cholesterol at 38 years of age, then have this test every 5 years.  You may need to have your cholesterol levels checked more often if: ? Your lipid or  cholesterol levels are high. ? You are older than 38 years of age. ? You are at high risk for heart disease. Cancer screening Lung Cancer  Lung cancer screening is recommended for adults 48-79 years old who are at high risk for lung cancer because of a history of smoking.  A yearly low-dose CT scan of the lungs is recommended for people who: ? Currently smoke. ? Have quit within the past 15 years. ? Have at least a 30-pack-year history of smoking. A pack year is smoking an average of one pack of cigarettes a day for 1 year.  Yearly screening should continue until it has been 15 years since you quit.  Yearly screening should stop if you develop a health problem that would prevent you from having lung cancer treatment. Breast Cancer  Practice breast self-awareness. This means understanding how your breasts normally appear and feel.  It also means doing regular breast self-exams. Let your health care provider know about any changes, no matter how small.  If you are in your 20s or 30s, you should have a clinical breast exam (CBE) by a health care provider every 1-3 years as part of a regular health exam.  If you are 22 or older, have a CBE every year. Also consider having a breast X-ray (mammogram) every year.  If you have a family history of breast cancer, talk to your health care provider about genetic screening.  If you are at high risk for breast cancer, talk  to your health care provider about having an MRI and a mammogram every year.  Breast cancer gene (BRCA) assessment is recommended for women who have family members with BRCA-related cancers. BRCA-related cancers include: ? Breast. ? Ovarian. ? Tubal. ? Peritoneal cancers.  Results of the assessment will determine the need for genetic counseling and BRCA1 and BRCA2 testing. Cervical Cancer Your health care provider may recommend that you be screened regularly for cancer of the pelvic organs (ovaries, uterus, and vagina).  This screening involves a pelvic examination, including checking for microscopic changes to the surface of your cervix (Pap test). You may be encouraged to have this screening done every 3 years, beginning at age 21.  For women ages 30-65, health care providers may recommend pelvic exams and Pap testing every 3 years, or they may recommend the Pap and pelvic exam, combined with testing for human papilloma virus (HPV), every 5 years. Some types of HPV increase your risk of cervical cancer. Testing for HPV may also be done on women of any age with unclear Pap test results.  Other health care providers may not recommend any screening for nonpregnant women who are considered low risk for pelvic cancer and who do not have symptoms. Ask your health care provider if a screening pelvic exam is right for you.  If you have had past treatment for cervical cancer or a condition that could lead to cancer, you need Pap tests and screening for cancer for at least 20 years after your treatment. If Pap tests have been discontinued, your risk factors (such as having a new sexual partner) need to be reassessed to determine if screening should resume. Some women have medical problems that increase the chance of getting cervical cancer. In these cases, your health care provider may recommend more frequent screening and Pap tests. Colorectal Cancer  This type of cancer can be detected and often prevented.  Routine colorectal cancer screening usually begins at 38 years of age and continues through 38 years of age.  Your health care provider may recommend screening at an earlier age if you have risk factors for colon cancer.  Your health care provider may also recommend using home test kits to check for hidden blood in the stool.  A small camera at the end of a tube can be used to examine your colon directly (sigmoidoscopy or colonoscopy). This is done to check for the earliest forms of colorectal cancer.  Routine  screening usually begins at age 50.  Direct examination of the colon should be repeated every 5-10 years through 38 years of age. However, you may need to be screened more often if early forms of precancerous polyps or small growths are found. Skin Cancer  Check your skin from head to toe regularly.  Tell your health care provider about any new moles or changes in moles, especially if there is a change in a mole's shape or color.  Also tell your health care provider if you have a mole that is larger than the size of a pencil eraser.  Always use sunscreen. Apply sunscreen liberally and repeatedly throughout the day.  Protect yourself by wearing long sleeves, pants, a wide-brimmed hat, and sunglasses whenever you are outside. Heart disease, diabetes, and high blood pressure  High blood pressure causes heart disease and increases the risk of stroke. High blood pressure is more likely to develop in: ? People who have blood pressure in the high end of the normal range (130-139/85-89 mm Hg). ? People   who are overweight or obese. ? People who are African American.  If you are 18-39 years of age, have your blood pressure checked every 3-5 years. If you are 40 years of age or older, have your blood pressure checked every year. You should have your blood pressure measured twice--once when you are at a hospital or clinic, and once when you are not at a hospital or clinic. Record the average of the two measurements. To check your blood pressure when you are not at a hospital or clinic, you can use: ? An automated blood pressure machine at a pharmacy. ? A home blood pressure monitor.  If you are between 55 years and 79 years old, ask your health care provider if you should take aspirin to prevent strokes.  Have regular diabetes screenings. This involves taking a blood sample to check your fasting blood sugar level. ? If you are at a normal weight and have a low risk for diabetes, have this test once  every three years after 38 years of age. ? If you are overweight and have a high risk for diabetes, consider being tested at a younger age or more often. Preventing infection Hepatitis B  If you have a higher risk for hepatitis B, you should be screened for this virus. You are considered at high risk for hepatitis B if: ? You were born in a country where hepatitis B is common. Ask your health care provider which countries are considered high risk. ? Your parents were born in a high-risk country, and you have not been immunized against hepatitis B (hepatitis B vaccine). ? You have HIV or AIDS. ? You use needles to inject street drugs. ? You live with someone who has hepatitis B. ? You have had sex with someone who has hepatitis B. ? You get hemodialysis treatment. ? You take certain medicines for conditions, including cancer, organ transplantation, and autoimmune conditions. Hepatitis C  Blood testing is recommended for: ? Everyone born from 1945 through 1965. ? Anyone with known risk factors for hepatitis C. Sexually transmitted infections (STIs)  You should be screened for sexually transmitted infections (STIs) including gonorrhea and chlamydia if: ? You are sexually active and are younger than 38 years of age. ? You are older than 38 years of age and your health care provider tells you that you are at risk for this type of infection. ? Your sexual activity has changed since you were last screened and you are at an increased risk for chlamydia or gonorrhea. Ask your health care provider if you are at risk.  If you do not have HIV, but are at risk, it may be recommended that you take a prescription medicine daily to prevent HIV infection. This is called pre-exposure prophylaxis (PrEP). You are considered at risk if: ? You are sexually active and do not regularly use condoms or know the HIV status of your partner(s). ? You take drugs by injection. ? You are sexually active with a partner  who has HIV. Talk with your health care provider about whether you are at high risk of being infected with HIV. If you choose to begin PrEP, you should first be tested for HIV. You should then be tested every 3 months for as long as you are taking PrEP. Pregnancy  If you are premenopausal and you may become pregnant, ask your health care provider about preconception counseling.  If you may become pregnant, take 400 to 800 micrograms (mcg) of folic acid every   day.  If you want to prevent pregnancy, talk to your health care provider about birth control (contraception). Osteoporosis and menopause  Osteoporosis is a disease in which the bones lose minerals and strength with aging. This can result in serious bone fractures. Your risk for osteoporosis can be identified using a bone density scan.  If you are 36 years of age or older, or if you are at risk for osteoporosis and fractures, ask your health care provider if you should be screened.  Ask your health care provider whether you should take a calcium or vitamin D supplement to lower your risk for osteoporosis.  Menopause may have certain physical symptoms and risks.  Hormone replacement therapy may reduce some of these symptoms and risks. Talk to your health care provider about whether hormone replacement therapy is right for you. Follow these instructions at home:  Schedule regular health, dental, and eye exams.  Stay current with your immunizations.  Do not use any tobacco products including cigarettes, chewing tobacco, or electronic cigarettes.  If you are pregnant, do not drink alcohol.  If you are breastfeeding, limit how much and how often you drink alcohol.  Limit alcohol intake to no more than 1 drink per day for nonpregnant women. One drink equals 12 ounces of beer, 5 ounces of wine, or 1 ounces of hard liquor.  Do not use street drugs.  Do not share needles.  Ask your health care provider for help if you need support  or information about quitting drugs.  Tell your health care provider if you often feel depressed.  Tell your health care provider if you have ever been abused or do not feel safe at home. This information is not intended to replace advice given to you by your health care provider. Make sure you discuss any questions you have with your health care provider. Document Released: 01/20/2011 Document Revised: 12/13/2015 Document Reviewed: 04/10/2015 Elsevier Interactive Patient Education  2019 [employer].

## 2018-07-20 ENCOUNTER — Ambulatory Visit (HOSPITAL_COMMUNITY)
Admission: RE | Admit: 2018-07-20 | Discharge: 2018-07-20 | Disposition: A | Discharge status: Home / Self Care | Payer: 59 | Source: Ambulatory Visit | Attending: General Surgery | Admitting: General Surgery

## 2018-07-20 DIAGNOSIS — K228 Other specified diseases of esophagus: Secondary | ICD-10-CM | POA: Diagnosis not present

## 2018-07-26 ENCOUNTER — Encounter: Payer: Self-pay | Admitting: Skilled Nursing Facility1

## 2018-07-26 ENCOUNTER — Encounter: Payer: Self-pay | Admitting: Family Medicine

## 2018-07-26 ENCOUNTER — Encounter: Payer: 59 | Attending: General Surgery | Admitting: Skilled Nursing Facility1

## 2018-07-26 DIAGNOSIS — Z6841 Body Mass Index (BMI) 40.0 and over, adult: Secondary | ICD-10-CM | POA: Diagnosis not present

## 2018-07-26 NOTE — Progress Notes (Signed)
Pre-Op Assessment Visit:  Pre-Operative Sleeve Gastrectomy Surgery  Patient was seen on 07/26/2018 for Pre-Operative Nutrition Assessment. Assessment and letter of approval faxed to [facility] Bariatric Surgery Program coordinator on 07/26/2018.   Pt states her fasting are about 115-120 having diabetes for about 1.5 years.   Pt expectation of surgery: to lose weight  Pt expectation of Dietitian: none stated  Start weight at [facility]: 260.6 BMI: 41.43  24 hr Dietary Recall: First Meal: 1 toast with sausage gravy, 2 eggs  Snack: apple or cookies Second Meal: sandwich out  Snack: chips and dip or cheese Third Meal: lasagna and broccoli or green beans  Snack:  Beverages: 2% white milk, chocolate milk, chai tea with creamer, diet soda, water  Encouraged to engage in 150 minutes of moderate physical activity including cardiovascular and weight baring weekly  Handouts given during visit include:  . Pre-Op Goals . Bariatric Surgery Protein Shakes During the appointment today the following Pre-Op Goals were reviewed with the patient: . Maintain or lose weight as instructed by your surgeon . Make healthy food choices . Begin to limit portion sizes . Limited concentrated sugars and fried foods . Keep fat/sugar in the single digits per serving on             food labels . Practice CHEWING your food  (aim for 30 chews per bite or until applesauce consistency) . Practice not drinking 15 minutes before, during, and 30 minutes after each meal/snack . Avoid all carbonated beverages  . Avoid/limit caffeinated beverages  . Avoid all sugar-sweetened beverages . Consume 3 meals per day; eat every 3-5 hours . Make a list of non-food related activities . Aim for 64-100 ounces of FLUID daily  . Aim for at least 60-80 grams of PROTEIN daily . Look for a liquid protein source that contain ?15 g protein and ?5 g carbohydrate  (ex: shakes, drinks, shots)  -Follow diet recommendations  listed below   Energy and Macronutrient Recomendations: Calories: 1600 Carbohydrate: 180 Protein: 120 Fat: 44  Demonstrated degree of understanding via:  Teach Back  Teaching Method Utilized:  Visual Auditory Hands on  Barriers to learning/adherence to lifestyle change: none identified   Patient to call the [facility] to enroll in Pre-Op and Post-Op Nutrition Education when surgery date is scheduled.

## 2018-08-08 MED ORDER — SCOPOLAMINE 1 MG/3DAYS TD PT72: 1.0000 | MEDICATED_PATCH | TRANSDERMAL | 12 refills | Status: DC

## 2018-08-09 MED FILL — traZODone HCL 50 MG TABS: 50 | 30 days supply | Qty: 30 | Fill #1

## 2018-08-09 MED FILL — LEVOTHYROXINE 100 MCG TABLE: 100 | 90 days supply | Qty: 90 | Fill #2

## 2018-08-12 ENCOUNTER — Ambulatory Visit (HOSPITAL_COMMUNITY)
Admission: RE | Admit: 2018-08-12 | Discharge: 2018-08-12 | Disposition: A | Discharge status: Home / Self Care | Payer: 59 | Source: Ambulatory Visit | Attending: General Surgery | Admitting: General Surgery

## 2018-08-25 ENCOUNTER — Other Ambulatory Visit: Payer: Self-pay | Admitting: Family Medicine

## 2018-08-25 MED FILL — TRULICITY 1.5 MG/0.5 ML PEN: 1.5 | 28 days supply | Qty: 2 | Fill #0

## 2018-08-26 MED FILL — metFORMIN HCL ER 750 MG TB2: 750 | 90 days supply | Qty: 90 | Fill #1 | Status: TO

## 2018-09-22 ENCOUNTER — Other Ambulatory Visit: Payer: Self-pay | Admitting: Family Medicine

## 2018-09-22 MED FILL — traZODone HCL 50 MG TABS: 50 | 30 days supply | Qty: 30 | Fill #2 | Status: TO

## 2018-09-22 MED FILL — TRULICITY 1.5 MG/0.5 ML PEN: 1.5 | 28 days supply | Qty: 2 | Fill #0

## 2018-10-04 ENCOUNTER — Other Ambulatory Visit: Payer: Self-pay

## 2018-10-04 ENCOUNTER — Encounter: Payer: 59 | Attending: General Surgery | Admitting: Skilled Nursing Facility1

## 2018-10-04 DIAGNOSIS — Z6841 Body Mass Index (BMI) 40.0 and over, adult: Secondary | ICD-10-CM | POA: Diagnosis not present

## 2018-10-04 NOTE — Progress Notes (Signed)
Pre-Operative Nutrition Class:  Appt start time: 2158   End time:  1830.  Patient was seen on 10/04/2018 for Pre-Operative Bariatric Surgery Education at [facility].   Surgery date:  Surgery type: sleeve Start weight at [facility]: 260 Weight today: 267  Samples given per MNT protocol. Patient educated on appropriate usage: Bariatric Advantage Multivitamin Lot # [id number] Exp:04/21  Bariatric Advantage Calcium  Lot # [id number] Exp: November 01, 2018  Premier Protein Powder  Shake Lot # [id number] Exp: October 25, 2018  The following the learning objectives were met by the patient during this course:  Identify Pre-Op Dietary Goals and will begin 2 weeks pre-operatively  Identify appropriate sources of fluids and proteins   State protein recommendations and appropriate sources pre and post-operatively  Identify Post-Operative Dietary Goals and will follow for 2 weeks post-operatively  Identify appropriate multivitamin and calcium sources  Describe the need for physical activity post-operatively and will follow MD recommendations  State when to call healthcare provider regarding medication questions or post-operative complications  Handouts given during class include:  Pre-Op Bariatric Surgery Diet Handout  Protein Shake Handout  Post-Op Bariatric Surgery Nutrition Handout  [facility] Program Information Flyer  Support Group Information Flyer  [facility] Bariatric Supplements Price List  Follow-Up Plan: Patient will follow-up at [facility] 2 weeks post operatively for diet advancement per MD.

## 2018-10-12 ENCOUNTER — Encounter: Payer: Self-pay | Admitting: Family Medicine

## 2018-10-12 ENCOUNTER — Other Ambulatory Visit: Payer: Self-pay

## 2018-10-12 DIAGNOSIS — F419 Anxiety disorder, unspecified: Secondary | ICD-10-CM

## 2018-10-12 MED ORDER — ESCITALOPRAM OXALATE 20 MG PO TABS: 10.0000 mg | ORAL_TABLET | Freq: Every day | ORAL | 1 refills | Status: DC

## 2018-10-12 MED ORDER — LEVOTHYROXINE SODIUM 100 MCG PO TABS: 100.0000 ug | ORAL_TABLET | Freq: Every day | ORAL | 3 refills | Status: DC

## 2018-10-15 ENCOUNTER — Other Ambulatory Visit: Payer: Self-pay

## 2018-10-15 ENCOUNTER — Ambulatory Visit (INDEPENDENT_AMBULATORY_CARE_PROVIDER_SITE_OTHER): Payer: 59 | Admitting: Family Medicine

## 2018-10-15 ENCOUNTER — Encounter: Payer: Self-pay | Admitting: Family Medicine

## 2018-10-15 VITALS — Temp 97.9°F | Ht 67.0 in | Wt 267.0 lb

## 2018-10-15 DIAGNOSIS — E039 Hypothyroidism, unspecified: Secondary | ICD-10-CM | POA: Diagnosis not present

## 2018-10-15 DIAGNOSIS — E119 Type 2 diabetes mellitus without complications: Secondary | ICD-10-CM

## 2018-10-15 DIAGNOSIS — F419 Anxiety disorder, unspecified: Secondary | ICD-10-CM | POA: Diagnosis not present

## 2018-10-15 DIAGNOSIS — Z6841 Body Mass Index (BMI) 40.0 and over, adult: Secondary | ICD-10-CM

## 2018-10-15 MED ORDER — DULAGLUTIDE 1.5 MG/0.5ML ~~LOC~~ SOAJ: SUBCUTANEOUS | 1 refills | Status: DC

## 2018-10-15 MED ORDER — LEVOTHYROXINE SODIUM 112 MCG PO TABS: ORAL_TABLET | ORAL | 1 refills | Status: DC

## 2018-10-15 MED ORDER — ESCITALOPRAM OXALATE 20 MG PO TABS: 20.0000 mg | ORAL_TABLET | Freq: Every day | ORAL | 1 refills | Status: DC

## 2018-10-15 NOTE — Progress Notes (Signed)
Virtual Visit via Video   I connected with Tammy Mooney on 10/15/18 at 11:00 AM EDT by a video enabled telemedicine application and verified that I am speaking with the correct person using two identifiers. Location patient: Home Location provider: [facility], Office Persons participating in the virtual visit: SHAUNTEL PREST, Briscoe Deutscher, DO   I discussed the limitations of evaluation and management by telemedicine and the availability of in person appointments. The patient expressed understanding and agreed to proceed.  Subjective:   HPI:   Type 2 diabetes mellitus without complication, without long-term current use of insulin ([name]) Has been taking Trulicity 1.5 weekly. As well as metformin xr 750mg . She has been doing well on everything above.  Medication compliance: compliant all of the time, diabetic diet compliance: compliant most of the time, home glucose monitoring: is performed regularly, further diabetic ROS: no polyuria or polydipsia, no chest pain, dyspnea or TIA's, no numbness, tingling or pain in extremities, no hypoglycemia, no medication side effects noted.  Class 3 severe obesity with serious comorbidity and body mass index (BMI) of 40.0 to 44.9 in adult, unspecified obesity type [facility])  She is on the bariatric track. She has done everything other than the psychology evaluation.   Usual eating pattern includes: The patient eats a regular, healthy diet.. Usual physical activity includes continuing to increase movement. Current life stressors: stress related to Covid-19 pandemic.  Anxiety: has had increased anxiety she will go up to whole tab and see if that helps. Will call if not better. She is having trouble getting to sleep as well. She will increase trazodone to 100mg  nightly and see if that helps.    ROS: See pertinent positives and negatives per HPI.  Patient Active Problem List   Diagnosis Date Noted  . Chronic pain of right knee 04/22/2018  .  Other insomnia 09/17/2017  . At risk for osteopenia 04/22/2017  . Vitamin D deficiency 02/23/2017  . Dyslipidemia associated with type 2 diabetes mellitus ([place]) 02/23/2017  . Type 2 diabetes mellitus without complication, without long-term current use of insulin ([place]) 02/09/2017  . Class 3 severe obesity with serious comorbidity and body mass index (BMI) of 40.0 to 44.9 in adult ([place]) 02/09/2017  . Allergic rhinitis 01/03/2016  . Primary hypothyroidism 11/13/2015  . Anxiety 11/13/2015  . PCOS (polycystic ovarian syndrome) 09/25/2011    Social History   Tobacco Use  . Smoking status: Never Smoker  . Smokeless tobacco: Never Used  Substance Use Topics  . Alcohol use: No   Current Outpatient Medications:  .  cetirizine (ZYRTEC) 10 MG tablet, Take 10 mg by mouth daily., Disp: , Rfl:  .  Dulaglutide (TRULICITY) 6.56 CL/2.7NT SOPN, 0.75 mg [place] q wk x 2 wks , then increase to 1.5 mg [place] if tolerating, Disp: 2 pen, Rfl: 0 .  escitalopram (LEXAPRO) 20 MG tablet, Take 0.5 tablets (10 mg total) by mouth daily., Disp: 90 tablet, Rfl: 1 .  levothyroxine (SYNTHROID, LEVOTHROID) 100 MCG tablet, Take 1 tablet (100 mcg total) by mouth daily., Disp: 90 tablet, Rfl: 3 .  metFORMIN (GLUCOPHAGE-XR) 750 MG 24 hr tablet, Take 1 tablet (750 mg total) by mouth every morning. TAKE 1 TABLETS BY MOUTH DAILY WITH BREAKFAST., Disp: 90 tablet, Rfl: 3 .  scopolamine (TRANSDERM-SCOP, 1.5 MG,) 1 MG/3DAYS, Place 1 patch (1.5 mg total) onto the skin every 3 (three) days., Disp: 10 patch, Rfl: 12 .  traZODone (DESYREL) 50 MG tablet, Take 1 tablet (50 mg total)  by mouth at bedtime., Disp: 90 tablet, Rfl: 2 .  TRULICITY 1.5 ZO/1.0RU SOPN, INJECT 1.5 MG INTO THE SKIN ONCE A WEEK, Disp: 2 mL, Rfl: 0  Allergies  Allergen Reactions  . Adhesive [Tape] Rash and Other (See Comments)    Plastic tape causes rash, paper tape is okay, tegaderm is ok  . Penicillins Rash    Objective:   VITALS: Per patient if applicable, see  vitals. GENERAL: Alert, appears well and in no acute distress. HEENT: Atraumatic, conjunctiva clear, no obvious abnormalities on inspection of external nose and ears. NECK: Normal movements of the head and neck. CARDIOPULMONARY: No increased WOB. Speaking in clear sentences. I:E ratio WNL.  MS: Moves all visible extremities without noticeable abnormality. PSYCH: Pleasant and cooperative, well-groomed. Speech normal rate and rhythm. Affect is appropriate. Insight and judgement are appropriate. Attention is focused, linear, and appropriate.  NEURO: CN grossly intact. Oriented as arrived to appointment on time with no prompting. Moves both UE equally.  SKIN: No obvious lesions, wounds, erythema, or cyanosis noted on face or hands.  Assessment and Plan:   Lovely was seen today for follow-up, diabetes, anxiety, insomnia and hypothyroidism.  Diagnoses and all orders for this visit:  Type 2 diabetes mellitus without complication, without long-term current use of insulin ([name]) -     Dulaglutide (TRULICITY) 1.5 EA/5.4UJ SOPN; INJECT 1.5 MG INTO THE SKIN ONCE A WEEK  Class 3 severe obesity with serious comorbidity and body mass index (BMI) of 40.0 to 44.9 in adult, unspecified obesity type ([name])  Anxiety -     escitalopram (LEXAPRO) 20 MG tablet; Take 1 tablet (20 mg total) by mouth daily.  Primary hypothyroidism -     levothyroxine (SYNTHROID, LEVOTHROID) 112 MCG tablet; Take one daily   . Reviewed expectations re: course of current medical issues. . Discussed self-management of symptoms. . Outlined signs and symptoms indicating need for more acute intervention. . Patient verbalized understanding and all questions were answered. Marland Kitchen Health Maintenance issues including appropriate healthy diet, exercise, and smoking avoidance were discussed with patient. . See orders for this visit as documented in the electronic medical record.  Briscoe Deutscher, DO 10/15/2018

## 2018-10-19 ENCOUNTER — Ambulatory Visit: Payer: 59 | Admitting: Psychiatry

## 2018-10-24 ENCOUNTER — Encounter: Payer: Self-pay | Admitting: Family Medicine

## 2018-10-25 MED FILL — TRULICITY 1.5 MG/0.5 ML PEN: 1.5 | 84 days supply | Qty: 6 | Fill #0

## 2018-10-25 MED FILL — traZODone HCL 50 MG TABS: 50 | 30 days supply | Qty: 30 | Fill #0

## 2018-10-25 MED FILL — LEVOTHYROXINE 112 MCG TAB: 112 | 90 days supply | Qty: 90 | Fill #0

## 2018-10-25 MED FILL — ESCITALOPRAM 20 MG TABLET: 20 | 90 days supply | Qty: 90 | Fill #0

## 2018-11-06 MED FILL — metFORMIN HCL ER 750 MG TB2: 750 | 90 days supply | Qty: 90 | Fill #0

## 2018-11-08 ENCOUNTER — Ambulatory Visit: Payer: 59 | Admitting: Psychology

## 2018-11-17 ENCOUNTER — Ambulatory Visit: Payer: 59 | Admitting: Psychology

## 2018-11-24 MED FILL — traZODone HCL 50 MG TABS: 50 | 90 days supply | Qty: 90 | Fill #0

## 2018-12-20 ENCOUNTER — Ambulatory Visit (INDEPENDENT_AMBULATORY_CARE_PROVIDER_SITE_OTHER): Payer: 59 | Admitting: Psychology

## 2018-12-20 DIAGNOSIS — F509 Eating disorder, unspecified: Secondary | ICD-10-CM

## 2019-01-03 ENCOUNTER — Ambulatory Visit (INDEPENDENT_AMBULATORY_CARE_PROVIDER_SITE_OTHER): Payer: 59 | Admitting: Psychology

## 2019-01-03 DIAGNOSIS — F509 Eating disorder, unspecified: Secondary | ICD-10-CM

## 2019-01-06 ENCOUNTER — Ambulatory Visit: Payer: Self-pay | Admitting: General Surgery

## 2019-01-06 DIAGNOSIS — E039 Hypothyroidism, unspecified: Secondary | ICD-10-CM | POA: Diagnosis not present

## 2019-01-06 DIAGNOSIS — E786 Lipoprotein deficiency: Secondary | ICD-10-CM | POA: Diagnosis not present

## 2019-01-06 DIAGNOSIS — E1169 Type 2 diabetes mellitus with other specified complication: Secondary | ICD-10-CM | POA: Diagnosis not present

## 2019-01-06 NOTE — H&P (Signed)
Tammy Mooney Documented: 01/06/2019 2:59 PM Location: [facility] Patient #: [id number] DOB: May 14, 1981 Married / Language: [name] / Race: White Female  History of Present Illness Randall Hiss M. Saleen Peden MD; 01/06/2019 3:44 PM) The patient is a 38 year old female who presents for a bariatric surgery evaluation. She comes in today for a follow-up visit regarding her severe obesity. I initially met her in November 2019. Her pathway to surgery was somewhat delayed by the coronavirus pandemic. She denies any medical changes since I last saw her. She denies any trips to the emergency room or hospital. She denies any chest pain, chest pressure, source of breath, dyspnea on exertion, orthopnea, heartburn or reflux. She denies any abdominal pain. She denies any blood thinners. She denies any drug or alcohol or tobacco use. Her upper GI, chest x-ray and EKG were unremarkable. She had had a full panel of labs prior to her visit with me in the fall which we have previously reviewed   05/2018 She is referred by Dr Briscoe Deutscher for evaluation of weight loss surgery. She attended our seminar on line. She is undecided as to procedure but is leaning toward a sleeve gastrectomy. She has struggled with her weight for many years. Despite numerous attempts for sustained weight loss she is been unsuccessful. She has tried Weight Watchers on several occasions, phentermine-all without any long-term success. She works with an obesity medicine specialist and nutritionist for 1 year in the past year. She learned invaluable lifestyle modifications but still struggles to maintain her weight loss. Her goals are to improve her diabetes, have more energy.  Her comorbidities include diabetes mellitus type 2, hypothyroidism, low HDL level  She denies any chest pain, chest pressure, source of breath, orthopnea, dyspnea on exertion, paroxysmal nocturnal dyspnea. She denies any personal blood clot history.  Her brother had a blood clot after car crash. She denies any significant peripheral edema. She does not have any heartburn or reflux. She denies any nausea, vomiting, diarrhea or constipation. She has a daily bowel movement her prior abdominal surgery includes 3 C-sections. She denies any stress urinary incontinence. She denies any dysuria hematuria. She denies any particular joint issues. She is been diabetic for about 2 years. She is not on insulin. She is on metformin and victoza. She denies any TIAs or migraines. She denies any tobacco use. She drinks alcohol on a rare occasion. She works for [employer]  She had blood work in June and July of this year. She had a normal comprehensive metabolic panel except for a blood glucose of 100. TSH level was 4.3. Total cholesterol 165. Triglyceride level 114. HDL level 34. Hemoglobin A1c 5.9. CBC normal   Problem List/Past Medical Randall Hiss M. Redmond Pulling, MD; 01/06/2019 3:47 PM) LOW HDL (UNDER 40) (E78.6) SEVERE OBESITY (E66.01) The patient meets weight loss surgery criteria.  Past Surgical History Randall Hiss M. Redmond Pulling, MD; 01/06/2019 3:47 PM) Cesarean Section - Multiple Oral Surgery Tonsillectomy  Diagnostic Studies History Randall Hiss M. Redmond Pulling, MD; 01/06/2019 3:47 PM) Colonoscopy never Mammogram never Pap Smear 1-5 years ago  Allergies Emeline Gins, Olar; 01/06/2019 3:01 PM) Penicillins Allergies Reconciled  Medication History Emeline Gins, CMA; 01/06/2019 3:02 PM) Accu-Chek Guide (In Vitro) Active. BD Luer-Lok Syringe (25G X 1"3 ML Misc,) Active. metFORMIN HCl ER (750MG Tablet ER 24HR, Oral) Active. Levothyroxine Sodium (100MCG Tablet, Oral) Active. Escitalopram Oxalate (10MG Tablet, Oral) Active. Victoza (18MG/3ML Soln Pen-inj, Subcutaneous) Active. traZODone HCl (50MG Tablet, Oral) Active. Trulicity (2.8UX/3.2GM Soln Pen-inj, Subcutaneous) Active. Medications Reconciled  Social History Randall Hiss M. Redmond Pulling, MD; 01/06/2019  3:47 PM) Caffeine use Carbonated beverages. No alcohol use No drug use Tobacco use Never smoker.  Family History Randall Hiss M. Redmond Pulling, MD; 01/06/2019 3:47 PM) Diabetes Mellitus Brother, Family Members In General, Mother. Heart Disease Father. Heart disease in female family member before age 3 Heart disease in female family member before age 39 Hypertension Father.  Pregnancy / Birth History Randall Hiss M. Redmond Pulling, MD; 01/06/2019 3:47 PM) Age at menarche 22 years. Contraceptive History Intrauterine device. Gravida 3 Irregular periods Length (months) of breastfeeding 3-6 Maternal age 20-30 Para 3  Other Problems Randall Hiss M. Redmond Pulling, MD; 01/06/2019 3:47 PM) Anxiety Disorder HYPOTHYROIDISM, ADULT (E03.9) DIABETES MELLITUS TYPE 2 IN OBESE (E11.69)     Review of Systems Randall Hiss M. Myca Perno MD; 01/06/2019 3:44 PM) General Not Present- Appetite Loss, Chills, Fatigue, Fever, Night Sweats, Weight Gain and Weight Loss. Skin Not Present- Change in Wart/Mole, Dryness, Hives, Jaundice, New Lesions, Non-Healing Wounds, Rash and Ulcer. HEENT Not Present- Earache, Hearing Loss, Hoarseness, Nose Bleed, Oral Ulcers, Ringing in the Ears, Seasonal Allergies, Sinus Pain, Sore Throat, Visual Disturbances, Wears glasses/contact lenses and Yellow Eyes. Respiratory Not Present- Bloody sputum, Chronic Cough, Difficulty Breathing, Snoring and Wheezing. Breast Not Present- Breast Mass, Breast Pain, Nipple Discharge and Skin Changes. Cardiovascular Not Present- Chest Pain, Difficulty Breathing Lying Down, Leg Cramps, Palpitations, Rapid Heart Rate, Shortness of Breath and Swelling of Extremities. Gastrointestinal Not Present- Abdominal Pain, Bloating, Bloody Stool, Change in Bowel Habits, Chronic diarrhea, Constipation, Difficulty Swallowing, Excessive gas, Gets full quickly at meals, Hemorrhoids, Indigestion, Nausea, Rectal Pain and Vomiting. Female Genitourinary Not Present- Frequency, Nocturia, Painful  Urination, Pelvic Pain and Urgency. Musculoskeletal Not Present- Back Pain, Joint Pain, Joint Stiffness, Muscle Pain, Muscle Weakness and Swelling of Extremities. Neurological Not Present- Decreased Memory, Fainting, Headaches, Numbness, Seizures, Tingling, Tremor, Trouble walking and Weakness. Psychiatric Not Present- Anxiety, Bipolar, Change in Sleep Pattern, Depression, Fearful and Frequent crying.  Vitals Emeline Gins CMA; 01/06/2019 3:01 PM) 01/06/2019 3:00 PM Weight: 264 lb Height: 66in Body Surface Area: 2.25 m Body Mass Index: 42.61 kg/m  Temp.: 98.63F  Pulse: 98 (Regular)  BP: 128/70 (Sitting, Left Arm, Standard)        Physical Exam Randall Hiss M. Rocky Gladden MD; 01/06/2019 3:44 PM)  General Mental Status-Alert. General Appearance-Consistent with stated age. Hydration-Well hydrated. Voice-Normal.  Head and Neck Head-normocephalic, atraumatic with no lesions or palpable masses. Trachea-midline. Thyroid Gland Characteristics - normal size and consistency.  Eye Eyeball - Bilateral-Extraocular movements intact. Sclera/Conjunctiva - Bilateral-No scleral icterus.  ENMT Ears Pinna - Bilateral - no cyanosis, no edema. Note:nml ext ears Nose and Sinuses External Inspection of the Nose - symmetric. Mouth and Throat -Note:lips intact.   Chest and Lung Exam Chest and lung exam reveals -quiet, even and easy respiratory effort with no use of accessory muscles and on auscultation, normal breath sounds, no adventitious sounds and normal vocal resonance. Inspection Chest Wall - Normal. Back - normal.  Breast - Did not examine.  Cardiovascular Cardiovascular examination reveals -normal heart sounds, regular rate and rhythm with no murmurs and normal pedal pulses bilaterally.  Abdomen Inspection Inspection of the abdomen reveals - No Hernias. Skin - Scar - no surgical scars. Palpation/Percussion Palpation and Percussion of the abdomen  reveal - Soft, Non Tender, No Rebound tenderness, No Rigidity (guarding) and No hepatosplenomegaly. Auscultation Auscultation of the abdomen reveals - Bowel sounds normal.  Peripheral Vascular Upper Extremity Palpation - Pulses bilaterally normal.  Neurologic Neurologic evaluation reveals -alert and oriented  x 3 with no impairment of recent or remote memory. Mental Status-Normal.  Neuropsychiatric The patient's mood and affect are described as -normal. Judgment and Insight-insight is appropriate concerning matters relevant to self.  Musculoskeletal Normal Exam - Left-Upper Extremity Strength Normal and Lower Extremity Strength Normal. Normal Exam - Right-Upper Extremity Strength Normal and Lower Extremity Strength Normal.  Lymphatic Head & Neck  General Head & Neck Lymphatics: Bilateral - Description - Normal. Axillary - Did not examine. Femoral & Inguinal - Did not examine.    Assessment & Plan Randall Hiss M. Lashana Spang MD; 01/06/2019 3:47 PM)  SEVERE OBESITY (E66.01) Story: The patient meets weight loss surgery criteria. Impression: The patient meets weight loss surgery criteria. I think the patient would be an acceptable candidate for Laparoscopic vertical sleeve gastrectomy  We rediscussed LSG. We rediscussed the preoperative, operative and postoperative process. We reviewed her evaluation to date. We reviewed the typical hospital course as well as the immediate recovery course. We reviewed the postoperative diet and the typical progression. We discussed the typical issues that we see routinely after this type of surgery.  We also discussed operating during the coronavirus pandemic. We discussed that she would be tested for coronavirus preoperatively and the need to isolate after her test. We discussed the importance of isolation immediately after surgery as well. Discussed social distancing, wearing a face mask, handwashing, etc. we discussed that should she contracted  coronavirus perioperatively her risk for complications increase. All her questions were asked and answered  Current Plans Pt Education - EMW_preopbariatric  LOW HDL (UNDER 40) (E78.6)   HYPOTHYROIDISM, ADULT (E03.9)   DIABETES MELLITUS TYPE 2 IN OBESE (E11.69)  Leighton Ruff. Redmond Pulling, MD, FACS General, Bariatric, & Minimally Invasive Surgery [facility], [place]

## 2019-01-11 MED FILL — TRULICITY 1.5 MG/0.5 ML PEN: 1.5 | 84 days supply | Qty: 6 | Fill #1

## 2019-01-17 MED FILL — LEVOTHYROXINE 112 MCG TAB: 112 | 90 days supply | Qty: 90 | Fill #1

## 2019-01-17 MED FILL — ESCITALOPRAM 20 MG TABLET: 20 | 90 days supply | Qty: 90 | Fill #1

## 2019-01-24 ENCOUNTER — Other Ambulatory Visit: Payer: Self-pay

## 2019-01-24 ENCOUNTER — Encounter: Payer: Self-pay | Admitting: Physician Assistant

## 2019-01-24 ENCOUNTER — Ambulatory Visit (INDEPENDENT_AMBULATORY_CARE_PROVIDER_SITE_OTHER): Payer: 59 | Admitting: Physician Assistant

## 2019-01-24 DIAGNOSIS — R05 Cough: Secondary | ICD-10-CM

## 2019-01-24 DIAGNOSIS — Z7189 Other specified counseling: Secondary | ICD-10-CM | POA: Diagnosis not present

## 2019-01-24 DIAGNOSIS — Z20828 Contact with and (suspected) exposure to other viral communicable diseases: Secondary | ICD-10-CM

## 2019-01-24 DIAGNOSIS — R059 Cough, unspecified: Secondary | ICD-10-CM

## 2019-01-24 NOTE — Progress Notes (Signed)
Virtual Visit via Video   I connected with Tammy Mooney on 01/24/19 at 10:40 AM EDT by a video enabled telemedicine application and verified that I am speaking with the correct person using two identifiers. Location patient: Home Location provider: [facility], Office Persons participating in the virtual visit: Tammy Mooney, [name] PA-C.  I discussed the limitations of evaluation and management by telemedicine and the availability of in person appointments. The patient expressed understanding and agreed to proceed.  I acted as a Education administrator for [employer], PA-C [employer], LPN  Subjective:   HPI:   Sinus problem Pt c/o nasal congestion with clear drainage x 5 days, sinus pressure and headache frontal area of head. Also has dry cough, scratchy throat and has no sense of taste or smell since yesterday. Asked pt is she has been exposed to COVID-19? Pt said not that she is aware of. Denies fever, chills, SOB. Has been taking Tylenol and Ibuprofen.  She works from home.  She is planning for gastric sleeve tentatively later this month.  ROS: See pertinent positives and negatives per HPI.  Patient Active Problem List   Diagnosis Date Noted  . Chronic pain of right knee 04/22/2018  . Other insomnia 09/17/2017  . At risk for osteopenia 04/22/2017  . Vitamin D deficiency 02/23/2017  . Dyslipidemia associated with type 2 diabetes mellitus ([place]) 02/23/2017  . Type 2 diabetes mellitus without complication, without long-term current use of insulin ([place]) 02/09/2017  . Class 3 severe obesity with serious comorbidity and body mass index (BMI) of 40.0 to 44.9 in adult ([place]) 02/09/2017  . Allergic rhinitis 01/03/2016  . Primary hypothyroidism 11/13/2015  . Anxiety 11/13/2015  . PCOS (polycystic ovarian syndrome) 09/25/2011    Social History   Tobacco Use  . Smoking status: Never Smoker  . Smokeless tobacco: Never Used  Substance Use Topics  . Alcohol use: No     Current Outpatient Medications:  .  cetirizine (ZYRTEC) 10 MG tablet, Take 10 mg by mouth daily., Disp: , Rfl:  .  Dulaglutide (TRULICITY) 1.5 YK/9.9IP SOPN, INJECT 1.5 MG INTO THE SKIN ONCE A WEEK, Disp: 12 pen, Rfl: 1 .  escitalopram (LEXAPRO) 20 MG tablet, Take 1 tablet (20 mg total) by mouth daily., Disp: 90 tablet, Rfl: 1 .  levothyroxine (SYNTHROID, LEVOTHROID) 112 MCG tablet, Take one daily, Disp: 90 tablet, Rfl: 1 .  metFORMIN (GLUCOPHAGE-XR) 750 MG 24 hr tablet, Take 1 tablet (750 mg total) by mouth every morning. TAKE 1 TABLETS BY MOUTH DAILY WITH BREAKFAST., Disp: 90 tablet, Rfl: 3 .  traZODone (DESYREL) 50 MG tablet, Take 1 tablet (50 mg total) by mouth at bedtime., Disp: 90 tablet, Rfl: 2  Allergies  Allergen Reactions  . Adhesive [Tape] Rash and Other (See Comments)    Plastic tape causes rash, paper tape is okay, tegaderm is ok  . Penicillins Rash    Objective:   VITALS: Per patient if applicable, see vitals. GENERAL: Alert, appears well and in no acute distress. HEENT: Atraumatic, conjunctiva clear, no obvious abnormalities on inspection of external nose and ears. NECK: Normal movements of the head and neck. CARDIOPULMONARY: No increased WOB. Speaking in clear sentences. I:E ratio WNL.  MS: Moves all visible extremities without noticeable abnormality. PSYCH: Pleasant and cooperative, well-groomed. Speech normal rate and rhythm. Affect is appropriate. Insight and judgement are appropriate. Attention is focused, linear, and appropriate.  NEURO: CN grossly intact. Oriented as arrived to appointment on time with no prompting.  Moves both UE equally.  SKIN: No obvious lesions, wounds, erythema, or cyanosis noted on face or hands.  Assessment and Plan:   Tammy Mooney was seen today for sinus problem.  Diagnoses and all orders for this visit:  Cough  Advice Given About Covid-19 Virus Infection   Patient has a respiratory illness without signs of acute distress or  respiratory compromise at this time. This is likely a viral infection, which can come from a number of respiratory viruses.  Symptoms concerning for COVID-19.  I did recommend that she reach out to [facility] regarding her symptoms. If they do not think that COVID-19 testing is appropriate or indicated, I did ask for her to let us know so I can order at [place] site.  Advised if they experience a "second sickening" or worsening symptoms as the illness progresses, they are to call the office for further instructions or seek emergent evaluation for any severe symptoms.   I did recommend self-quarantine regarding her symptoms until testing completed and symptom improvement.   . Reviewed expectations re: course of current medical issues. . Discussed self-management of symptoms. . Outlined signs and symptoms indicating need for more acute intervention. . Patient verbalized understanding and all questions were answered. Marland Kitchen Health Maintenance issues including appropriate healthy diet, exercise, and smoking avoidance were discussed with patient. . See orders for this visit as documented in the electronic medical record.  I discussed the assessment and treatment plan with the patient. The patient was provided an opportunity to ask questions and all were answered. The patient agreed with the plan and demonstrated an understanding of the instructions.   The patient was advised to call back or seek an in-person evaluation if the symptoms worsen or if the condition fails to improve as anticipated.   CMA or LPN served as scribe during this visit. History, Physical, and Plan performed by medical provider. The above documentation has been reviewed and is accurate and complete.  [place], [place] 01/24/2019

## 2019-01-25 ENCOUNTER — Telehealth: Payer: Self-pay | Admitting: Skilled Nursing Facility1

## 2019-01-25 NOTE — Telephone Encounter (Signed)
Dietitian called pt to assess their understanding of the pre-op nutrition recommendations through the teach back method to ensure the pts knowledge readiness in preparation for surgery.     Pt had numerous questions: these questions were answered to her satisfaction.   Blood sugar recommendations were reviewed.

## 2019-01-26 ENCOUNTER — Encounter: Payer: Self-pay | Admitting: Family Medicine

## 2019-02-02 ENCOUNTER — Other Ambulatory Visit: Payer: Self-pay

## 2019-02-02 DIAGNOSIS — E119 Type 2 diabetes mellitus without complications: Secondary | ICD-10-CM

## 2019-02-02 MED ORDER — METFORMIN HCL 850 MG PO TABS: 850.0000 mg | ORAL_TABLET | Freq: Two times a day (BID) | ORAL | 1 refills | Status: DC

## 2019-02-02 MED FILL — metFORMIN HCL 850 MG TABS: 850 | 30 days supply | Qty: 60 | Fill #0

## 2019-02-03 ENCOUNTER — Other Ambulatory Visit (HOSPITAL_COMMUNITY): Payer: 59

## 2019-02-03 ENCOUNTER — Encounter (HOSPITAL_COMMUNITY): Admission: RE | Admit: 2019-02-03 | Payer: 59 | Source: Ambulatory Visit

## 2019-02-10 ENCOUNTER — Encounter: Payer: Self-pay | Admitting: Family Medicine

## 2019-02-16 MED FILL — traZODone HCL 50 MG TABS: 50 | 90 days supply | Qty: 90 | Fill #1

## 2019-02-22 ENCOUNTER — Ambulatory Visit: Payer: 59

## 2019-02-25 ENCOUNTER — Encounter (HOSPITAL_COMMUNITY): Admission: RE | Admit: 2019-02-25 | Payer: 59 | Source: Ambulatory Visit

## 2019-02-26 MED FILL — metFORMIN HCL 850 MG TABS: 850 | 30 days supply | Qty: 60 | Fill #1

## 2019-03-01 NOTE — Patient Outreach (Signed)
Transition of Care call referral cancelled due to admission postponed.

## 2019-03-02 ENCOUNTER — Ambulatory Visit (INDEPENDENT_AMBULATORY_CARE_PROVIDER_SITE_OTHER): Payer: 59 | Admitting: Family Medicine

## 2019-03-02 ENCOUNTER — Encounter: Payer: Self-pay | Admitting: Family Medicine

## 2019-03-02 ENCOUNTER — Ambulatory Visit: Payer: Self-pay

## 2019-03-02 ENCOUNTER — Other Ambulatory Visit: Payer: Self-pay

## 2019-03-02 ENCOUNTER — Telehealth: Payer: Self-pay | Admitting: Family Medicine

## 2019-03-02 VITALS — Temp 98.6°F | Ht 67.0 in | Wt 267.0 lb

## 2019-03-02 DIAGNOSIS — R42 Dizziness and giddiness: Secondary | ICD-10-CM | POA: Diagnosis not present

## 2019-03-02 MED ORDER — MECLIZINE HCL 25 MG PO TABS: 25.0000 mg | ORAL_TABLET | Freq: Three times a day (TID) | ORAL | 0 refills | Status: DC | PRN

## 2019-03-02 NOTE — Patient Instructions (Signed)
Benign Positional Vertigo Vertigo is the feeling that you or your surroundings are moving when they are not. Benign positional vertigo is the most common form of vertigo. This is usually a harmless condition (benign). This condition is positional. This means that symptoms are triggered by certain movements and positions. This condition can be dangerous if it occurs while you are doing something that could cause harm to you or others. This includes activities such as driving or operating machinery. What are the causes? In many cases, the cause of this condition is not known. It may be caused by a disturbance in an area of the inner ear that helps your brain to sense movement and balance. This disturbance can be caused by:  Viral infection (labyrinthitis).  Head injury.  Repetitive motion, such as jumping, dancing, or running. What increases the risk? You are more likely to develop this condition if:  You are a woman.  You are 50 years of age or older. What are the signs or symptoms? Symptoms of this condition usually happen when you move your head or your eyes in different directions. Symptoms may start suddenly, and usually last for less than a minute. They include:  Loss of balance and falling.  Feeling like you are spinning or moving.  Feeling like your surroundings are spinning or moving.  Nausea and vomiting.  Blurred vision.  Dizziness.  Involuntary eye movement (nystagmus). Symptoms can be mild and cause only minor problems, or they can be severe and interfere with daily life. Episodes of benign positional vertigo may return (recur) over time. Symptoms may improve over time. How is this diagnosed? This condition may be diagnosed based on:  Your medical history.  Physical exam of the head, neck, and ears.  Tests, such as: ? MRI. ? CT scan. ? Eye movement tests. Your health care provider may ask you to change positions quickly while he or she watches you for symptoms  of benign positional vertigo, such as nystagmus. Eye movement may be tested with a variety of exams that are designed to evaluate or stimulate vertigo. ? An electroencephalogram (EEG). This records electrical activity in your brain. ? Hearing tests. You may be referred to a health care provider who specializes in ear, nose, and throat (ENT) problems (otolaryngologist) or a provider who specializes in disorders of the nervous system (neurologist). How is this treated?  This condition may be treated in a session in which your health care provider moves your head in specific positions to adjust your inner ear back to normal. Treatment for this condition may take several sessions. Surgery may be needed in severe cases, but this is rare. In some cases, benign positional vertigo may resolve on its own in 2-4 weeks. Follow these instructions at home: Safety  Move slowly. Avoid sudden body or head movements or certain positions, as told by your health care provider.  Avoid driving until your health care provider says it is safe for you to do so.  Avoid operating heavy machinery until your health care provider says it is safe for you to do so.  Avoid doing any tasks that would be dangerous to you or others if vertigo occurs.  If you have trouble walking or keeping your balance, try using a cane for stability. If you feel dizzy or unstable, sit down right away.  Return to your normal activities as told by your health care provider. Ask your health care provider what activities are safe for you. General instructions  Take over-the-counter   and prescription medicines only as told by your health care provider.  Drink enough fluid to keep your urine pale yellow.  Keep all follow-up visits as told by your health care provider. This is important. Contact a health care provider if:  You have a fever.  Your condition gets worse or you develop new symptoms.  Your family or friends notice any  behavioral changes.  You have nausea or vomiting that gets worse.  You have numbness or a "pins and needles" sensation. Get help right away if you:  Have difficulty speaking or moving.  Are always dizzy.  Faint.  Develop severe headaches.  Have weakness in your legs or arms.  Have changes in your hearing or vision.  Develop a stiff neck.  Develop sensitivity to light. Summary  Vertigo is the feeling that you or your surroundings are moving when they are not. Benign positional vertigo is the most common form of vertigo.  The cause of this condition is not known. It may be caused by a disturbance in an area of the inner ear that helps your brain to sense movement and balance.  Symptoms include loss of balance and falling, feeling that you or your surroundings are moving, nausea and vomiting, and blurred vision.  This condition can be diagnosed based on symptoms, physical exam, and other tests, such as MRI, CT scan, eye movement tests, and hearing tests.  Follow safety instructions as told by your health care provider. You will also be told when to contact your health care provider in case of problems. This information is not intended to replace advice given to you by your health care provider. Make sure you discuss any questions you have with your health care provider. Document Released: 04/14/2006 Document Revised: 12/16/2017 Document Reviewed: 12/16/2017 Elsevier Patient Education  2020 [employer].

## 2019-03-02 NOTE — Progress Notes (Signed)
Tammy Mooney is a 38 y.o. female here for an acute visit.  History of Present Illness:   Dizziness This is a new problem. The current episode started yesterday. The problem occurs intermittently. The problem has been waxing and waning. Associated symptoms include nausea. The symptoms are aggravated by bending and twisting. She has tried sleep for the symptoms. The treatment provided mild relief.   PMHx, SurgHx, SocialHx, Medications, and Allergies were reviewed in the Visit Navigator and updated as appropriate.  Current Medications   Current Outpatient Medications:  .  cetirizine (ZYRTEC) 10 MG tablet, Take 10 mg by mouth daily as needed for allergies. , Disp: , Rfl:  .  Cholecalciferol (VITAMIN D3) 125 MCG (5000 UT) CAPS, Take 5,000 Units by mouth daily., Disp: , Rfl:  .  Dulaglutide (TRULICITY) 1.5 BO/1.7PZ SOPN, INJECT 1.5 MG INTO THE SKIN ONCE A WEEK (Patient taking differently: Inject 1.5 mg into the skin every Thursday. ), Disp: 12 pen, Rfl: 1 .  escitalopram (LEXAPRO) 20 MG tablet, Take 1 tablet (20 mg total) by mouth daily., Disp: 90 tablet, Rfl: 1 .  levothyroxine (SYNTHROID, LEVOTHROID) 112 MCG tablet, Take one daily (Patient taking differently: Take 112 mcg by mouth daily before breakfast. ), Disp: 90 tablet, Rfl: 1 .  metFORMIN (GLUCOPHAGE) 850 MG tablet, Take 1 tablet (850 mg total) by mouth 2 (two) times daily with a meal., Disp: 60 tablet, Rfl: 1 .  Multiple Vitamin (MULTIVITAMIN PO), Take 1 tablet by mouth daily., Disp: , Rfl:  .  traZODone (DESYREL) 50 MG tablet, Take 1 tablet (50 mg total) by mouth at bedtime., Disp: 90 tablet, Rfl: 2  Allergies  Allergen Reactions  . Adhesive [Tape] Rash and Other (See Comments)    Plastic tape causes rash, paper tape is okay, tegaderm is ok  . Penicillins Rash   Review of Systems   Pertinent items are noted in the HPI. Otherwise, ROS is negative.  Vitals   Vitals:   03/02/19 1523 03/02/19 1524 03/02/19 1525  Temp:  98.6 F (37 C)    TempSrc: Oral    SpO2: 97% 97% 96%  Weight: 267 lb (121.1 kg)    Height: 5\' 7"  (1.702 m)       Body mass index is 41.82 kg/m.  Physical Exam   Physical Exam Vitals signs and nursing note reviewed.  HENT:     Head: Normocephalic and atraumatic.     Comments: Nystagmus and room spinning sensation with sitting up. Eyes:     Pupils: Pupils are equal, round, and reactive to light.  Neck:     Musculoskeletal: Normal range of motion and neck supple.  Cardiovascular:     Rate and Rhythm: Normal rate and regular rhythm.     Heart sounds: Normal heart sounds.  Pulmonary:     Effort: Pulmonary effort is normal.  Abdominal:     Palpations: Abdomen is soft.  Skin:    General: Skin is warm.  Psychiatric:        Behavior: Behavior normal.      Results for orders placed or performed in visit on 03/02/19  CBC with Differential/Platelet  Result Value Ref Range   WBC 9.4 4.0 - 10.5 K/uL   RBC 4.93 3.87 - 5.11 Mil/uL   Hemoglobin 14.4 12.0 - 15.0 g/dL   HCT 43.1 36.0 - 46.0 %   MCV 87.5 78.0 - 100.0 fl   MCHC 33.4 30.0 - 36.0 g/dL   RDW 14.4 11.5 - 15.5 %  Platelets 432.0 (H) 150.0 - 400.0 K/uL   Neutrophils Relative % 63.1 43.0 - 77.0 %   Lymphocytes Relative 28.1 12.0 - 46.0 %   Monocytes Relative 5.4 3.0 - 12.0 %   Eosinophils Relative 2.4 0.0 - 5.0 %   Basophils Relative 1.0 0.0 - 3.0 %   Neutro Abs 6.0 1.4 - 7.7 K/uL   Lymphs Abs 2.6 0.7 - 4.0 K/uL   Monocytes Absolute 0.5 0.1 - 1.0 K/uL   Eosinophils Absolute 0.2 0.0 - 0.7 K/uL   Basophils Absolute 0.1 0.0 - 0.1 K/uL  Comprehensive metabolic panel  Result Value Ref Range   Sodium 138 135 - 145 mEq/L   Potassium 4.0 3.5 - 5.1 mEq/L   Chloride 105 96 - 112 mEq/L   CO2 24 19 - 32 mEq/L   Glucose, Bld 162 (H) 70 - 99 mg/dL   BUN 9 6 - 23 mg/dL   Creatinine, Ser 0.69 0.40 - 1.20 mg/dL   Total Bilirubin 0.3 0.2 - 1.2 mg/dL   Alkaline Phosphatase 71 39 - 117 U/L   AST 17 0 - 37 U/L   ALT 33 0 - 35  U/L   Total Protein 6.8 6.0 - 8.3 g/dL   Albumin 4.3 3.5 - 5.2 g/dL   Calcium 9.7 8.4 - 10.5 mg/dL   GFR 94.53 >60.00 mL/min  Magnesium  Result Value Ref Range   Magnesium 2.0 1.5 - 2.5 mg/dL  Vitamin B12  Result Value Ref Range   Vitamin B-12 316 211 - 911 pg/mL  TSH  Result Value Ref Range   TSH 4.30 0.35 - 4.50 uIU/mL  Hemoglobin A1c  Result Value Ref Range   Hgb A1c MFr Bld 6.3 4.6 - 6.5 %   Assessment and Plan   Rise was seen today for dizziness.  Diagnoses and all orders for this visit:  Vertigo -     CBC with Differential/Platelet -     Comprehensive metabolic panel -     Magnesium -     Vitamin B12 -     TSH -     Hemoglobin A1c -     meclizine (ANTIVERT) 25 MG tablet; Take 1 tablet (25 mg total) by mouth 3 (three) times daily as needed for dizziness.   . Reviewed expectations re: course of current medical issues. . Discussed self-management of symptoms. . Outlined signs and symptoms indicating need for more acute intervention. . Patient verbalized understanding and all questions were answered. Marland Kitchen Health Maintenance issues including appropriate healthy diet, exercise, and smoking avoidance were discussed with patient. . See orders for this visit as documented in the electronic medical record. . Patient received an After Visit Summary.  Briscoe Deutscher, DO [place] 03/06/2019

## 2019-03-02 NOTE — Telephone Encounter (Signed)
Have called pt spoke with her will call back after have reviewed with [name].

## 2019-03-02 NOTE — Telephone Encounter (Signed)
Admin, please schedule.   Clinical, please be advised

## 2019-03-02 NOTE — Telephone Encounter (Signed)
Pt. Called with c/o onset of dizziness since Thursday, 8/6. C/o onset of SOB and feeling more weak since yesterday.  Stated her shortness of breath feels more like her overall body being tired; c/o occas. Dry cough.  Denied fever. Reported low BP at 9:10 AM; 88/58, pulse 99.  C/o dizziness that makes her head feel heavy.  C/o woozy feeling, but denied passing out.  C/o headache; "it feels like a sinus headache."  Started taking Zyrtec 3 days ago.  Is trying to drink more water. Denied decreased urine output.  Reported she stopped taking Metformin and Thyroid medication about 2-3 days ago.  Blood sugar was 152 @ 9:40 Am; ate breakfast about one hour prior.  Pt. Advised to continue to drink fluids; advised of signs of dehydration.  Transferred to Scheduler for an appt. With provider.  Pt. Agreed with plan.     Reason for Disposition . [1] MODERATE dizziness (e.g., interferes with normal activities) AND [2] has NOT been evaluated by physician for this  (Exception: dizziness caused by heat exposure, sudden standing, or poor fluid intake)  Answer Assessment - Initial Assessment Questions 1. DESCRIPTION: "Describe your dizziness."     Onset Thurs., 8/6; head feels heavy 2. LIGHTHEADED: "Do you feel lightheaded?" (e.g., somewhat faint, woozy, weak upon standing)     Feels woozy like she could pass out 3. VERTIGO: "Do you feel like either you or the room is spinning or tilting?" (i.e. vertigo)    At times; closes eyes when this happens 4. SEVERITY: "How bad is it?"  "Do you feel like you are going to faint?" "Can you stand and walk?"   - MILD - walking normally   - MODERATE - interferes with normal activities (e.g., work, school)    - SEVERE - unable to stand, requires support to walk, feels like passing out now.      Feels like laying down; entire body feels heavy; unsteady with walking  5. ONSET:  "When did the dizziness begin?"     8/6 6. AGGRAVATING FACTORS: "Does anything make it worse?" (e.g.,  standing, change in head position)     Standing and quick change in position  7. HEART RATE: "Can you tell me your heart rate?" "How many beats in 15 seconds?"  (Note: not all patients can do this)       Had feeling a couple times yesterday of irreg. heart beat; denied at this time  8. CAUSE: "What do you think is causing the dizziness?"     No new medications; stopped taking all of medication a couple days ago; stopped Metformin and Thyroid medication.  9. RECURRENT SYMPTOM: "Have you had dizziness before?" If so, ask: "When was the last time?" "What happened that time?"     No  10. OTHER SYMPTOMS: "Do you have any other symptoms?" (e.g., fever, chest pain, vomiting, diarrhea, bleeding)      BP 88/58, pulse 99 @ 9:10 AM; BP 115/79, P. 85 @ 9:47 AM; denied chest pain, c/o shortness of breath at intervals; feels very tired since yesterday; denied fever/ chills; c/o nausea/ no vomiting.   11. PREGNANCY: "Is there any chance you are pregnant?" "When was your last menstrual period?"      Has an IUD; LMP quite a long time ago  Protocols used: [place]

## 2019-03-02 NOTE — Telephone Encounter (Signed)
Pt was triaged for dizziness, tiredness, and SOB. The triage nurse transferred the pt to me to schedule and let the pt know it would be for a virtual visit and Tammy Mooney seemed ok with that. Once I spoke with her and tried to schedule her an appt with Dr. Jonni Sanger today because Dr. Jonni Sanger had available appts, the pt became frustrated about the appt being virtual and not with [name]. The pt said nevermind she didn't need the appt and hung up before I had a chance to offer her anything else or the available time slot with [name] on Friday.

## 2019-03-03 LAB — CBC WITH DIFFERENTIAL/PLATELET
Basophils Absolute: 0.1 10*3/uL (ref 0.0–0.1)
Basophils Relative: 1 % (ref 0.0–3.0)
Eosinophils Absolute: 0.2 10*3/uL (ref 0.0–0.7)
Eosinophils Relative: 2.4 % (ref 0.0–5.0)
HCT: 43.1 % (ref 36.0–46.0)
Hemoglobin: 14.4 g/dL (ref 12.0–15.0)
Lymphocytes Relative: 28.1 % (ref 12.0–46.0)
Lymphs Abs: 2.6 10*3/uL (ref 0.7–4.0)
MCHC: 33.4 g/dL (ref 30.0–36.0)
MCV: 87.5 fl (ref 78.0–100.0)
Monocytes Absolute: 0.5 10*3/uL (ref 0.1–1.0)
Monocytes Relative: 5.4 % (ref 3.0–12.0)
Neutro Abs: 6 10*3/uL (ref 1.4–7.7)
Neutrophils Relative %: 63.1 % (ref 43.0–77.0)
Platelets: 432 10*3/uL — ABNORMAL HIGH (ref 150.0–400.0)
RBC: 4.93 Mil/uL (ref 3.87–5.11)
RDW: 14.4 % (ref 11.5–15.5)
WBC: 9.4 10*3/uL (ref 4.0–10.5)

## 2019-03-03 LAB — COMPREHENSIVE METABOLIC PANEL
ALT: 33 U/L (ref 0–35)
AST: 17 U/L (ref 0–37)
Albumin: 4.3 g/dL (ref 3.5–5.2)
Alkaline Phosphatase: 71 U/L (ref 39–117)
BUN: 9 mg/dL (ref 6–23)
CO2: 24 mEq/L (ref 19–32)
Calcium: 9.7 mg/dL (ref 8.4–10.5)
Chloride: 105 mEq/L (ref 96–112)
Creatinine, Ser: 0.69 mg/dL (ref 0.40–1.20)
GFR: 94.53 mL/min (ref >60.00)
Glucose, Bld: 162 mg/dL — ABNORMAL HIGH (ref 70–99)
Potassium: 4 mEq/L (ref 3.5–5.1)
Sodium: 138 mEq/L (ref 135–145)
Total Bilirubin: 0.3 mg/dL (ref 0.2–1.2)
Total Protein: 6.8 g/dL (ref 6.0–8.3)

## 2019-03-03 LAB — MAGNESIUM: Magnesium: 2 mg/dL (ref 1.5–2.5)

## 2019-03-03 LAB — VITAMIN B12: Vitamin B-12: 316 pg/mL (ref 211–911)

## 2019-03-03 LAB — HEMOGLOBIN A1C: Hgb A1c MFr Bld: 6.3 % (ref 4.6–6.5)

## 2019-03-03 LAB — TSH: TSH: 4.3 u[IU]/mL (ref 0.35–4.50)

## 2019-03-03 NOTE — Telephone Encounter (Signed)
patient was seen in office yesterday

## 2019-03-04 ENCOUNTER — Encounter: Payer: Self-pay | Admitting: Family Medicine

## 2019-03-06 ENCOUNTER — Encounter: Payer: Self-pay | Admitting: Family Medicine

## 2019-03-08 MED FILL — MECLIZINE 25 MG TABLET: 25 | 10 days supply | Qty: 30 | Fill #0

## 2019-03-15 ENCOUNTER — Ambulatory Visit: Payer: 59

## 2019-03-24 ENCOUNTER — Ambulatory Visit: Payer: Self-pay | Admitting: General Surgery

## 2019-03-24 DIAGNOSIS — E039 Hypothyroidism, unspecified: Secondary | ICD-10-CM | POA: Diagnosis not present

## 2019-03-24 DIAGNOSIS — Z8619 Personal history of other infectious and parasitic diseases: Secondary | ICD-10-CM | POA: Diagnosis not present

## 2019-03-24 DIAGNOSIS — E669 Obesity, unspecified: Secondary | ICD-10-CM | POA: Diagnosis not present

## 2019-03-24 DIAGNOSIS — E786 Lipoprotein deficiency: Secondary | ICD-10-CM | POA: Diagnosis not present

## 2019-03-24 DIAGNOSIS — E1169 Type 2 diabetes mellitus with other specified complication: Secondary | ICD-10-CM | POA: Diagnosis not present

## 2019-03-24 NOTE — H&P (Signed)
Lorenza Cambridge Documented: 03/24/2019 3:34 PM Location: [facility] Patient #: [id number] DOB: May 20, 1981 Married / Language: [name] / Race: White Female  History of Present Illness Randall Hiss M. Felice Hope MD; 03/24/2019 4:04 PM) The patient is a 38 year old female who presents for a bariatric surgery evaluation. She comes in today for follow regarding her severe obesity. She was actually supposed to have surgery about a month ago but we had to cancel surgery because unfortunately the patient contracted covidd in late june/early july. Her symptoms started with sinus pain and sinus pressure and she has a history of frequent sinus issues that she didn't think much of it initially but then she lost her taste and smell which prompted a coronavirus testis came back positive. She states that her generalized symptoms took about 3 weeks to resolve and her taste and smell came back after about 6 weeks. She reports that she is doing well. She had follow-up with her primary care doctor in mid August. She had an isolated case of vertigo it is resolved. She had labs in mid August which showed a normal conference a metabolic panel except for blood sugar 162. Vitamin B12 and TSH levels were unremarkable. Hemoglobin A1c was 6.2. She reports energy level is normal. No shortness of breath. No chest pain or cough.  12/2018 She comes in today for a follow-up visit regarding her severe obesity. I initially met her in November 2019. Her pathway to surgery was somewhat delayed by the coronavirus pandemic. She denies any medical changes since I last saw her. She denies any trips to the emergency room or hospital. She denies any chest pain, chest pressure, source of breath, dyspnea on exertion, orthopnea, heartburn or reflux. She denies any abdominal pain. She denies any blood thinners. She denies any drug or alcohol or tobacco use. Her upper GI, chest x-ray and EKG were unremarkable. She had had a full  panel of labs prior to her visit with me in the fall which we have previously reviewed   05/2018 She is referred by Dr Briscoe Deutscher for evaluation of weight loss surgery. She attended our seminar on line. She is undecided as to procedure but is leaning toward a sleeve gastrectomy. She has struggled with her weight for many years. Despite numerous attempts for sustained weight loss she is been unsuccessful. She has tried Weight Watchers on several occasions, phentermine-all without any long-term success. She works with an obesity medicine specialist and nutritionist for 1 year in the past year. She learned invaluable lifestyle modifications but still struggles to maintain her weight loss. Her goals are to improve her diabetes, have more energy.  Her comorbidities include diabetes mellitus type 2, hypothyroidism, low HDL level  She denies any chest pain, chest pressure, source of breath, orthopnea, dyspnea on exertion, paroxysmal nocturnal dyspnea. She denies any personal blood clot history. Her brother had a blood clot after car crash. She denies any significant peripheral edema. She does not have any heartburn or reflux. She denies any nausea, vomiting, diarrhea or constipation. She has a daily bowel movement her prior abdominal surgery includes 3 C-sections. She denies any stress urinary incontinence. She denies any dysuria hematuria. She denies any particular joint issues. She is been diabetic for about 2 years. She is not on insulin. She is on metformin and victoza. She denies any TIAs or migraines. She denies any tobacco use. She drinks alcohol on a rare occasion. She works for [employer]  She had blood work in June and  July of this year. She had a normal comprehensive metabolic panel except for a blood glucose of 100. TSH level was 4.3. Total cholesterol 165. Triglyceride level 114. HDL level 34. Hemoglobin A1c 5.9. CBC normal   Problem List/Past Medical Randall Hiss M. 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  Past Surgical History Randall Hiss M. Redmond Pulling, MD; 03/24/2019 4:06 PM) Cesarean Section - Multiple Oral Surgery Tonsillectomy  Diagnostic Studies History Randall Hiss M. Redmond Pulling, MD; 03/24/2019 4:06 PM) Colonoscopy never Mammogram never Pap Smear 1-5 years ago  Allergies Sabino Gasser, [place]; 03/24/2019 3:34 PM) Penicillins Allergies Reconciled  Medication History Sabino Gasser, CMA; 08/25/4268 6:23 PM) Trulicity (7.6EG/3.1DV Soln Pen-inj, Subcutaneous) Active. Accu-Chek Guide (In Vitro) Active. BD Luer-Lok Syringe (25G X 1"3 ML Misc,) Active. metFORMIN HCl ER (750MG Tablet ER 24HR, Oral) Active. Levothyroxine Sodium (100MCG Tablet, Oral) Active. Escitalopram Oxalate (10MG Tablet, Oral) Active. Victoza (18MG/3ML Soln Pen-inj, Subcutaneous) Active. traZODone HCl (50MG Tablet, Oral) Active. metFORMIN HCl (850MG Tablet, Oral) Active. Meclizine HCl (25MG Tablet, Oral) Active. Levothyroxine Sodium (112MCG Tablet, Oral) Active. Medications Reconciled  Social History Randall Hiss M. Redmond Pulling, MD; 03/24/2019 4:06 PM) Caffeine use Carbonated beverages. No alcohol use No drug use Tobacco use Never smoker.  Family History Randall Hiss M. Redmond Pulling, MD; 03/24/2019 4:06 PM) Diabetes Mellitus Brother, Family Members In General, Mother. Heart Disease Father. Heart disease in female family member before age 57 Heart disease in female family member before age 39 Hypertension Father.  Pregnancy / Birth History Randall Hiss M. Redmond Pulling, MD; 03/24/2019 4:06 PM) Age at menarche 22 years. Contraceptive History Intrauterine device. Gravida 3 Irregular periods Length (months) of breastfeeding 3-6 Maternal age 40-30 Para 3  Other Problems Randall Hiss M. Redmond Pulling, MD; 03/24/2019 4:06 PM) Anxiety Disorder HYPOTHYROIDISM, ADULT  (E03.9) DIABETES MELLITUS TYPE 2 IN OBESE (E11.69)     Review of Systems Randall Hiss M. Torah Pinnock MD; 03/24/2019 4:04 PM) All other systems negative  Vitals Sabino Gasser CMA; 03/24/2019 3:35 PM) 03/24/2019 3:35 PM Weight: 269.5 lb Height: 66in Body Surface Area: 2.27 m Body Mass Index: 43.5 kg/m  Temp.: 98.8F(Temporal)  Pulse: 98 (Regular)  BP: 136/84 (Sitting, Left Arm, Standard)        Physical Exam Randall Hiss M. Amirra Herling MD; 03/24/2019 4:04 PM)  General Mental Status-Alert. General Appearance-Consistent with stated age. Hydration-Well hydrated. Voice-Normal.  Head and Neck Head-normocephalic, atraumatic with no lesions or palpable masses. Trachea-midline. Thyroid Gland Characteristics - normal size and consistency.  Eye Eyeball - Bilateral-Extraocular movements intact. Sclera/Conjunctiva - Bilateral-No scleral icterus.  ENMT Ears Pinna - Bilateral - no cyanosis, no edema. Note:nml ext ears Nose and Sinuses External Inspection of the Nose - symmetric. Mouth and Throat -Note:lips intact.   Chest and Lung Exam Chest and lung exam reveals -quiet, even and easy respiratory effort with no use of accessory muscles and on auscultation, normal breath sounds, no adventitious sounds and normal vocal resonance. Inspection Chest Wall - Normal. Back - normal.  Breast - Did not examine.  Cardiovascular Cardiovascular examination reveals -normal heart sounds, regular rate and rhythm with no murmurs and normal pedal pulses bilaterally.  Abdomen Inspection Inspection of the abdomen reveals - No Hernias. Skin - Scar - no surgical scars. Palpation/Percussion Palpation and Percussion of the abdomen reveal - Soft, Non Tender, No Rebound tenderness, No Rigidity (guarding) and No hepatosplenomegaly. Auscultation Auscultation of the abdomen reveals - Bowel sounds normal.  Peripheral Vascular Upper Extremity Palpation -  Pulses bilaterally  normal.  Neurologic Neurologic evaluation reveals -alert and oriented x 3 with no impairment of recent or remote memory. Mental Status-Normal.  Neuropsychiatric The patient's mood and affect are described as -normal. Judgment and Insight-insight is appropriate concerning matters relevant to self.  Musculoskeletal Normal Exam - Left-Upper Extremity Strength Normal and Lower Extremity Strength Normal. Normal Exam - Right-Upper Extremity Strength Normal and Lower Extremity Strength Normal.  Lymphatic Head & Neck  General Head & Neck Lymphatics: Bilateral - Description - Normal. Axillary - Did not examine. Femoral & Inguinal - Did not examine.    Assessment & Plan Randall Hiss M. Ingram Onnen MD; 03/24/2019 4:06 PM)  SEVERE OBESITY (E66.01) Story: The patient meets weight loss surgery criteria. Impression: The patient meets weight loss surgery criteria. I think the patient would be an acceptable candidate for Laparoscopic vertical sleeve gastrectomy  We have discussed sleeve gastrectomy surgery on several occasions and she did not feel she needed to have a rediscussion about risk and benefits. We discussed the typical hospitalization again.  It appears that she is recovered from her coronavirus episode. I think it is safe to proceed with surgery.  We also discussed operating during the coronavirus pandemic. We discussed that she would be tested for coronavirus preoperatively and the need to isolate after her test. We discussed the importance of isolation immediately after surgery as well. Discussed social distancing, wearing a face mask, handwashing, etc. we discussed that should she contracted coronavirus perioperatively her risk for complications increase. 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 Redmond Pulling, MD, FACS General, Bariatric, & Minimally Invasive Surgery [facility], [place]

## 2019-03-24 NOTE — H&P (View-Only) (Signed)
Tammy Mooney Documented: 03/24/2019 3:34 PM Location: [facility] Patient #: [id number] DOB: [birth year] Married / Language: Tammy Mooney / Race: White Female  History of Present Illness Randall Hiss M. Lamondre Wesche MD; 03/24/2019 4:04 PM) The patient is a 38 year old female who presents for a bariatric surgery evaluation. She comes in today for follow regarding her severe obesity. She was actually supposed to have surgery about a month ago but we had to cancel surgery because unfortunately the patient contracted covidd in late june/early july. Her symptoms started with sinus pain and sinus pressure and she has a history of frequent sinus issues that she didn't think much of it initially but then she lost her taste and smell which prompted a coronavirus testis came back positive. She states that her generalized symptoms took about 3 weeks to resolve and her taste and smell came back after about 6 weeks. She reports that she is doing well. She had follow-up with her primary care doctor in mid August. She had an isolated case of vertigo it is resolved. She had labs in mid August which showed a normal conference a metabolic panel except for blood sugar 162. Vitamin B12 and TSH levels were unremarkable. Hemoglobin A1c was 6.2. She reports energy level is normal. No shortness of breath. No chest pain or cough.  12/2018 She comes in today for a follow-up visit regarding her severe obesity. I initially met her in November 2019. Her pathway to surgery was somewhat delayed by the coronavirus pandemic. She denies any medical changes since I last saw her. She denies any trips to the emergency room or hospital. She denies any chest pain, chest pressure, source of breath, dyspnea on exertion, orthopnea, heartburn or reflux. She denies any abdominal pain. She denies any blood thinners. She denies any drug or alcohol or tobacco use. Her upper GI, chest x-ray and EKG were unremarkable. She had had a full  panel of labs prior to her visit with me in the fall which we have previously reviewed   05/2018 She is referred by Dr Briscoe Deutscher for evaluation of weight loss surgery. She attended our seminar on line. She is undecided as to procedure but is leaning toward a sleeve gastrectomy. She has struggled with her weight for many years. Despite numerous attempts for sustained weight loss she is been unsuccessful. She has tried Weight Watchers on several occasions, phentermine-all without any long-term success. She works with an obesity medicine specialist and nutritionist for 1 year in the past year. She learned invaluable lifestyle modifications but still struggles to maintain her weight loss. Her goals are to improve her diabetes, have more energy.  Her comorbidities include diabetes mellitus type 2, hypothyroidism, low HDL level  She denies any chest pain, chest pressure, source of breath, orthopnea, dyspnea on exertion, paroxysmal nocturnal dyspnea. She denies any personal blood clot history. Her brother had a blood clot after car crash. She denies any significant peripheral edema. She does not have any heartburn or reflux. She denies any nausea, vomiting, diarrhea or constipation. She has a daily bowel movement her prior abdominal surgery includes 3 C-sections. She denies any stress urinary incontinence. She denies any dysuria hematuria. She denies any particular joint issues. She is been diabetic for about 2 years. She is not on insulin. She is on metformin and victoza. She denies any TIAs or migraines. She denies any tobacco use. She drinks alcohol on a rare occasion. She works for [employer]  She had blood work in June and  July of this year. She had a normal comprehensive metabolic panel except for a blood glucose of 100. TSH level was 4.3. Total cholesterol 165. Triglyceride level 114. HDL level 34. Hemoglobin A1c 5.9. CBC normal   Problem List/Past Medical Randall Hiss M. 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  Past Surgical History Randall Hiss M. Redmond Pulling, MD; 03/24/2019 4:06 PM) Cesarean Section - Multiple Oral Surgery Tonsillectomy  Diagnostic Studies History Randall Hiss M. Redmond Pulling, MD; 03/24/2019 4:06 PM) Colonoscopy never Mammogram never Pap Smear 1-5 years ago  Allergies Sabino Gasser, Lindsay; 03/24/2019 3:34 PM) Penicillins Allergies Reconciled  Medication History Sabino Gasser, CMA; 02/23/9291 4:46 PM) Trulicity (2.8MN/8.1RR Soln Pen-inj, Subcutaneous) Active. Accu-Chek Guide (In Vitro) Active. BD Luer-Lok Syringe (25G X 1"3 ML Misc,) Active. metFORMIN HCl ER (750MG Tablet ER 24HR, Oral) Active. Levothyroxine Sodium (100MCG Tablet, Oral) Active. Escitalopram Oxalate (10MG Tablet, Oral) Active. Victoza (18MG/3ML Soln Pen-inj, Subcutaneous) Active. traZODone HCl (50MG Tablet, Oral) Active. metFORMIN HCl (850MG Tablet, Oral) Active. Meclizine HCl (25MG Tablet, Oral) Active. Levothyroxine Sodium (112MCG Tablet, Oral) Active. Medications Reconciled  Social History Randall Hiss M. Redmond Pulling, MD; 03/24/2019 4:06 PM) Caffeine use Carbonated beverages. No alcohol use No drug use Tobacco use Never smoker.  Family History Randall Hiss M. Redmond Pulling, MD; 03/24/2019 4:06 PM) Diabetes Mellitus Brother, Family Members In General, Mother. Heart Disease Father. Heart disease in female family member before age 63 Heart disease in female family member before age 66 Hypertension Father.  Pregnancy / Birth History Randall Hiss M. Redmond Pulling, MD; 03/24/2019 4:06 PM) Age at menarche 43 years. Contraceptive History Intrauterine device. Gravida 3 Irregular periods Length (months) of breastfeeding 3-6 Maternal age 22-30 Para 3  Other Problems Randall Hiss M. Redmond Pulling, MD; 03/24/2019 4:06 PM) Anxiety Disorder HYPOTHYROIDISM, ADULT  (E03.9) DIABETES MELLITUS TYPE 2 IN OBESE (E11.69)     Review of Systems Randall Hiss M. Lanai Conlee MD; 03/24/2019 4:04 PM) All other systems negative  Vitals Sabino Gasser CMA; 03/24/2019 3:35 PM) 03/24/2019 3:35 PM Weight: 269.5 lb Height: 66in Body Surface Area: 2.27 m Body Mass Index: 43.5 kg/m  Temp.: 98.58F(Temporal)  Pulse: 98 (Regular)  BP: 136/84 (Sitting, Left Arm, Standard)        Physical Exam Randall Hiss M. Donevan Biller MD; 03/24/2019 4:04 PM)  General Mental Status-Alert. General Appearance-Consistent with stated age. Hydration-Well hydrated. Voice-Normal.  Head and Neck Head-normocephalic, atraumatic with no lesions or palpable masses. Trachea-midline. Thyroid Gland Characteristics - normal size and consistency.  Eye Eyeball - Bilateral-Extraocular movements intact. Sclera/Conjunctiva - Bilateral-No scleral icterus.  ENMT Ears Pinna - Bilateral - no cyanosis, no edema. Note:nml ext ears Nose and Sinuses External Inspection of the Nose - symmetric. Mouth and Throat -Note:lips intact.   Chest and Lung Exam Chest and lung exam reveals -quiet, even and easy respiratory effort with no use of accessory muscles and on auscultation, normal breath sounds, no adventitious sounds and normal vocal resonance. Inspection Chest Wall - Normal. Back - normal.  Breast - Did not examine.  Cardiovascular Cardiovascular examination reveals -normal heart sounds, regular rate and rhythm with no murmurs and normal pedal pulses bilaterally.  Abdomen Inspection Inspection of the abdomen reveals - No Hernias. Skin - Scar - no surgical scars. Palpation/Percussion Palpation and Percussion of the abdomen reveal - Soft, Non Tender, No Rebound tenderness, No Rigidity (guarding) and No hepatosplenomegaly. Auscultation Auscultation of the abdomen reveals - Bowel sounds normal.  Peripheral Vascular Upper Extremity Palpation -  Pulses bilaterally  normal.  Neurologic Neurologic evaluation reveals -alert and oriented x 3 with no impairment of recent or remote memory. Mental Status-Normal.  Neuropsychiatric The patient's mood and affect are described as -normal. Judgment and Insight-insight is appropriate concerning matters relevant to self.  Musculoskeletal Normal Exam - Left-Upper Extremity Strength Normal and Lower Extremity Strength Normal. Normal Exam - Right-Upper Extremity Strength Normal and Lower Extremity Strength Normal.  Lymphatic Head & Neck  General Head & Neck Lymphatics: Bilateral - Description - Normal. Axillary - Did not examine. Femoral & Inguinal - Did not examine.    Assessment & Plan Randall Hiss M. Daquarius Dubeau MD; 03/24/2019 4:06 PM)  SEVERE OBESITY (E66.01) Story: The patient meets weight loss surgery criteria. Impression: The patient meets weight loss surgery criteria. I think the patient would be an acceptable candidate for Laparoscopic vertical sleeve gastrectomy  We have discussed sleeve gastrectomy surgery on several occasions and she did not feel she needed to have a rediscussion about risk and benefits. We discussed the typical hospitalization again.  It appears that she is recovered from her coronavirus episode. I think it is safe to proceed with surgery.  We also discussed operating during the coronavirus pandemic. We discussed that she would be tested for coronavirus preoperatively and the need to isolate after her test. We discussed the importance of isolation immediately after surgery as well. Discussed social distancing, wearing a face mask, handwashing, etc. we discussed that should she contracted coronavirus perioperatively her risk for complications increase. 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 Redmond Pulling, MD, FACS General, Bariatric, & Minimally Invasive Surgery [facility], [place]

## 2019-03-29 ENCOUNTER — Ambulatory Visit: Payer: 59

## 2019-03-29 ENCOUNTER — Other Ambulatory Visit: Payer: Self-pay | Admitting: Family Medicine

## 2019-03-29 NOTE — Patient Instructions (Addendum)
DUE TO COVID-19 ONLY ONE VISITOR IS ALLOWED TO COME WITH YOU AND STAY IN THE WAITING ROOM ONLY DURING PRE OP AND PROCEDURE DAY OF SURGERY. THE 1 VISITOR MAY VISIT WITH YOU AFTER SURGERY IN YOUR PRIVATE ROOM DURING VISITING HOURS ONLY!  YOU NEED TO HAVE A COVID 19 TEST ON____9-11-2020___ @___2 :00PM____, THIS TEST MUST BE DONE BEFORE SURGERY, COME  Tammy Mooney , [id number].  ([place]) ONCE YOUR COVID TEST IS COMPLETED, PLEASE BEGIN THE QUARANTINE INSTRUCTIONS AS OUTLINED IN YOUR HANDOUT.                Tammy Mooney    Your procedure is scheduled on: 04-05-2019   Report to [facility] Main  Entrance   Report to admitting at 7:45AM     Call this number if you have problems the morning of surgery [phone]    NO SOLID FOOD AFTER 6:00 PM THE NIGHT BEFORE YOUR SURGERY. YOU MAY DRINK CLEAR FLUIDS. MORNING OF SURGERY DRINK:   [place] [address] [place]. DRINK ALL OF THE  G2 DRINK AT ONE TIME.    THE G2 DRINK YOU DRINK BEFORE YOU LEAVE HOME WILL BE THE LAST FLUIDS YOU HAVE BEFORE SURGERY.       CLEAR LIQUID DIET   Foods Allowed                                                                     Foods Excluded  Coffee and tea, regular and decaf (no creamer)           liquids that you cannot  Plain Jell-O any favor except red or purple                  see through such as: Fruit ices (not with fruit pulp)                                     milk, soups, orange juice  Iced Popsicles                                    All solid food                            Cranberry, grape and apple juices Sports drinks like Gatorade Lightly seasoned clear broth or consume(fat free) Sugar, honey syrup  Sample Menu Breakfast                                Lunch                                     Supper Cranberry juice                    Beef broth  Chicken broth Jell-O                                      Grape juice                           Apple juice Coffee or tea                        Jell-O                                      Popsicle                                                Coffee or tea                        Coffee or tea  _____________________________________________________________________        BRUSH YOUR TEETH MORNING OF SURGERY AND RINSE YOUR MOUTH OUT, NO CHEWING GUM CANDY OR MINTS.     Take these medicines the morning of surgery with A SIP OF WATER: ESCITALOPRAM, levothyroxine, zyrtec    How to Manage Your Diabetes Before and After Surgery  Why is it important to control my blood sugar before and after surgery? . Improving blood sugar levels before and after surgery helps healing and can limit problems. . A way of improving blood sugar control is eating a healthy diet by: o  Eating less sugar and carbohydrates o  Increasing activity/exercise o  Talking with your doctor about reaching your blood sugar goals . High blood sugars (greater than 180 mg/dL) can raise your risk of infections and slow your recovery, so you will need to focus on controlling your diabetes during the weeks before surgery. . Make sure that the doctor who takes care of your diabetes knows about your planned surgery including the date and location.  How do I manage my blood sugar before surgery? . Check your blood sugar at least 4 times a day, starting 2 days before surgery, to make sure that the level is not too high or low. o Check your blood sugar the morning of your surgery when you wake up and every 2 hours until you get to the Short Stay unit. . If your blood sugar is less than 70 mg/dL, you will need to treat for low blood sugar: o Do not take insulin. o Treat a low blood sugar (less than 70 mg/dL) with  cup of clear juice (cranberry or apple), 4 glucose tablets, OR glucose gel. o Recheck blood sugar in 15 minutes after treatment (to make sure it is greater than 70 mg/dL). If your  blood sugar is not greater than 70 mg/dL on recheck, call [phone] for further instructions. . Report your blood sugar to the short stay nurse when you get to Short Stay.  . If you are admitted to the hospital after surgery: o Your blood sugar will be checked by the staff and you will probably be given insulin after surgery (instead of oral diabetes medicines) to make sure you have good blood sugar levels. o The  goal for blood sugar control after surgery is 80-180 mg/dL.   WHAT DO I DO ABOUT MY DIABETES MEDICATION?      . THE MORNING OF SURGERY, Do not take oral diabetes medicines (pills) the morning of surgery.. Also do not take other diabetes injectables, including Byetta (exenatide), Bydureon (exenatide ER), Victoza (liraglutide), or Trulicity (dulaglutide) the morning of surgery.     Patient Signature:  Date:   Nurse Signature:  Date:   Reviewed and Endorsed by [facility] Patient Education Committee, August 2015                                 You may not have any metal on your body including hair pins and              piercings  Do not wear jewelry, make-up, lotions, powders or perfumes, deodorant             Do not wear nail polish.  Do not shave  48 hours prior to surgery.              Men may shave face and neck.   Do not bring valuables to the hospital. [place].  Contacts, dentures or bridgework may not be worn into surgery.  Leave suitcase in the car. After surgery it may be brought to your room.     Patients discharged the day of surgery will not be allowed to drive home. IF YOU ARE HAVING SURGERY AND GOING HOME THE SAME DAY, YOU MUST HAVE AN ADULT TO DRIVE YOU HOME AND BE WITH YOU FOR 24 HOURS. YOU MAY GO HOME BY TAXI OR UBER OR ORTHERWISE, BUT AN ADULT MUST ACCOMPANY YOU HOME AND STAY WITH YOU FOR 24 HOURS.  Name and phone number of your driver:  Special Instructions: N/A              Please read over the  following fact sheets you were given: _____________________________________________________________________             [facility] - Preparing for Surgery Before surgery, you can play an important role.  Because skin is not sterile, your skin needs to be as free of germs as possible.  You can reduce the number of germs on your skin by washing with CHG (chlorahexidine gluconate) soap before surgery.  CHG is an antiseptic cleaner which kills germs and bonds with the skin to continue killing germs even after washing. Please DO NOT use if you have an allergy to CHG or antibacterial soaps.  If your skin becomes reddened/irritated stop using the CHG and inform your nurse when you arrive at Short Stay. Do not shave (including legs and underarms) for at least 48 hours prior to the first CHG shower.  You may shave your face/neck. Please follow these instructions carefully:  1.  Shower with CHG Soap the night before surgery and the  morning of Surgery.  2.  If you choose to wash your hair, wash your hair first as usual with your  normal  shampoo.  3.  After you shampoo, rinse your hair and body thoroughly to remove the  shampoo.                           4.  Use CHG  as you would any other liquid soap.  You can apply chg directly  to the skin and wash                       Gently with a scrungie or clean washcloth.  5.  Apply the CHG Soap to your body ONLY FROM THE NECK DOWN.   Do not use on face/ open                           Wound or open sores. Avoid contact with eyes, ears mouth and genitals (private parts).                       Wash face,  Genitals (private parts) with your normal soap.             6.  Wash thoroughly, paying special attention to the area where your surgery  will be performed.  7.  Thoroughly rinse your body with warm water from the neck down.  8.  DO NOT shower/wash with your normal soap after using and rinsing off  the CHG Soap.                9.  Pat yourself dry with a clean  towel.            10.  Wear clean pajamas.            11.  Place clean sheets on your bed the night of your first shower and do not  sleep with pets. Day of Surgery : Do not apply any lotions/deodorants the morning of surgery.  Please wear clean clothes to the hospital/surgery center.  FAILURE TO FOLLOW THESE INSTRUCTIONS MAY RESULT IN THE CANCELLATION OF YOUR SURGERY PATIENT SIGNATURE_________________________________  NURSE SIGNATURE__________________________________  ________________________________________________________________________   PAIN IS EXPECTED AFTER SURGERY AND WILL NOT BE COMPLETELY ELIMINATED. AMBULATION AND TYLENOL WILL HELP REDUCE INCISIONAL AND GAS PAIN. MOVEMENT IS KEY!  YOU ARE EXPECTED TO BE OUT OF BED WITHIN 4 HOURS OF ADMISSION TO YOUR PATIENT ROOM.  SITTING IN THE RECLINER THROUGHOUT THE DAY IS IMPORTANT FOR DRINKING FLUIDS AND MOVING GAS THROUGHOUT THE GI TRACT.  COMPRESSION STOCKINGS SHOULD BE WORN [place] UNLESS YOU ARE WALKING.   INCENTIVE SPIROMETER SHOULD BE USED EVERY HOUR WHILE AWAKE TO DECREASE POST-OPERATIVE COMPLICATIONS SUCH AS PNEUMONIA.  WHEN DISCHARGED HOME, IT IS IMPORTANT TO CONTINUE TO WALK EVERY HOUR AND USE THE INCENTIVE SPIROMETER EVERY HOUR.    Incentive Spirometer  An incentive spirometer is a tool that can help keep your lungs clear and active. This tool measures how well you are filling your lungs with each breath. Taking long deep breaths may help reverse or decrease the chance of developing breathing (pulmonary) problems (especially infection) following:  A long period of time when you are unable to move or be active. BEFORE THE PROCEDURE   If the spirometer includes an indicator to show your best effort, your nurse or respiratory therapist will set it to a desired goal.  If possible, sit up straight or lean slightly forward. Try not to slouch.  Hold the incentive spirometer in an upright  position. INSTRUCTIONS FOR USE  1. Sit on the edge of your bed if possible, or sit up as far as you can in bed or on a chair. 2. Hold the incentive spirometer in an upright position. 3. Breathe out normally. 4. Place  the mouthpiece in your mouth and seal your lips tightly around it. 5. Breathe in slowly and as deeply as possible, raising the piston or the ball toward the top of the column. 6. Hold your breath for 3-5 seconds or for as long as possible. Allow the piston or ball to fall to the bottom of the column. 7. Remove the mouthpiece from your mouth and breathe out normally. 8. Rest for a few seconds and repeat Steps 1 through 7 at least 10 times every 1-2 hours when you are awake. Take your time and take a few normal breaths between deep breaths. 9. The spirometer may include an indicator to show your best effort. Use the indicator as a goal to work toward during each repetition. 10. After each set of 10 deep breaths, practice coughing to be sure your lungs are clear. If you have an incision (the cut made at the time of surgery), support your incision when coughing by placing a pillow or rolled up towels firmly against it. Once you are able to get out of bed, walk around indoors and cough well. You may stop using the incentive spirometer when instructed by your caregiver.  RISKS AND COMPLICATIONS  Take your time so you do not get dizzy or light-headed.  If you are in pain, you may need to take or ask for pain medication before doing incentive spirometry. It is harder to take a deep breath if you are having pain. AFTER USE  Rest and breathe slowly and easily.  It can be helpful to keep track of a log of your progress. Your caregiver can provide you with a simple table to help with this. If you are using the spirometer at home, follow these instructions: [place] IF:   You are having difficultly using the spirometer.  You have trouble using the spirometer as often as  instructed.  Your pain medication is not giving enough relief while using the spirometer.  You develop fever of 100.5 F (38.1 C) or higher. SEEK IMMEDIATE MEDICAL CARE IF:   You cough up bloody sputum that had not been present before.  You develop fever of 102 F (38.9 C) or greater.  You develop worsening pain at or near the incision site. MAKE SURE YOU:   Understand these instructions.  Will watch your condition.  Will get help right away if you are not doing well or get worse. Document Released: 11/17/2006 Document Revised: 09/29/2011 Document Reviewed: 01/18/2007 [facility] Patient Information 2014 [place], [place].   ________________________________________________________________________

## 2019-03-30 ENCOUNTER — Encounter (HOSPITAL_COMMUNITY): Payer: Self-pay

## 2019-03-30 ENCOUNTER — Other Ambulatory Visit: Payer: Self-pay

## 2019-03-30 ENCOUNTER — Encounter (HOSPITAL_COMMUNITY)
Admission: RE | Admit: 2019-03-30 | Discharge: 2019-03-30 | Disposition: A | Discharge status: Home / Self Care | Payer: 59 | Source: Ambulatory Visit | Attending: General Surgery | Admitting: General Surgery

## 2019-03-30 DIAGNOSIS — Z0184 Encounter for antibody response examination: Secondary | ICD-10-CM | POA: Diagnosis not present

## 2019-03-30 DIAGNOSIS — Z01812 Encounter for preprocedural laboratory examination: Secondary | ICD-10-CM | POA: Insufficient documentation

## 2019-03-30 DIAGNOSIS — Z20828 Contact with and (suspected) exposure to other viral communicable diseases: Secondary | ICD-10-CM | POA: Insufficient documentation

## 2019-03-30 HISTORY — DX: Anxiety disorder, unspecified: F41.9

## 2019-03-30 HISTORY — DX: Dizziness and giddiness: R42

## 2019-03-30 HISTORY — DX: Coronavirus infection, unspecified: B34.2

## 2019-03-30 LAB — CBC WITH DIFFERENTIAL/PLATELET
Abs Immature Granulocytes: 0.03 10*3/uL (ref 0.00–0.07)
Basophils Absolute: 0.1 10*3/uL (ref 0.0–0.1)
Basophils Relative: 1 %
Eosinophils Absolute: 0.2 10*3/uL (ref 0.0–0.5)
Eosinophils Relative: 2 %
HCT: 44.4 % (ref 36.0–46.0)
Hemoglobin: 14.7 g/dL (ref 12.0–15.0)
Immature Granulocytes: 0 %
Lymphocytes Relative: 26 %
Lymphs Abs: 2.8 10*3/uL (ref 0.7–4.0)
MCH: 28.8 pg (ref 26.0–34.0)
MCHC: 33.1 g/dL (ref 30.0–36.0)
MCV: 87.1 fL (ref 80.0–100.0)
Monocytes Absolute: 0.6 10*3/uL (ref 0.1–1.0)
Monocytes Relative: 6 %
Neutro Abs: 7 10*3/uL (ref 1.7–7.7)
Neutrophils Relative %: 65 %
Platelets: 413 10*3/uL — ABNORMAL HIGH (ref 150–400)
RBC: 5.1 MIL/uL (ref 3.87–5.11)
RDW: 13.3 % (ref 11.5–15.5)
WBC: 10.7 10*3/uL — ABNORMAL HIGH (ref 4.0–10.5)
nRBC: 0 % (ref 0.0–0.2)

## 2019-03-30 LAB — COMPREHENSIVE METABOLIC PANEL
ALT: 38 U/L (ref 0–44)
AST: 24 U/L (ref 15–41)
Albumin: 4.4 g/dL (ref 3.5–5.0)
Alkaline Phosphatase: 67 U/L (ref 38–126)
Anion gap: 12 (ref 5–15)
BUN: 14 mg/dL (ref 6–20)
CO2: 21 mmol/L — ABNORMAL LOW (ref 22–32)
Calcium: 9.7 mg/dL (ref 8.9–10.3)
Chloride: 103 mmol/L (ref 98–111)
Creatinine, Ser: 0.68 mg/dL (ref 0.44–1.00)
GFR calc Af Amer: >60 mL/min (ref >60)
GFR calc non Af Amer: >60 mL/min (ref >60)
Glucose, Bld: 92 mg/dL (ref 70–99)
Potassium: 3.5 mmol/L (ref 3.5–5.1)
Sodium: 136 mmol/L (ref 135–145)
Total Bilirubin: 0.5 mg/dL (ref 0.3–1.2)
Total Protein: 7.8 g/dL (ref 6.5–8.1)

## 2019-03-30 LAB — ABO/RH: ABO/RH(D): A POS

## 2019-03-30 LAB — GLUCOSE, CAPILLARY: Glucose-Capillary: 85 mg/dL (ref 70–99)

## 2019-03-30 MED FILL — metFORMIN HCL 850 MG TABS: 850 | 30 days supply | Qty: 60 | Fill #0

## 2019-03-30 NOTE — Progress Notes (Signed)
CBG COLLECTED AT PRE-OP APPT WAS 85. DATA TRANSFER ERROR OCCURRED WHEN GLUCOMETER DOCKED, THEREFORE CBG NOT AVAILABLE IN Epic .   03/30/19 1323  Vitals  Temp 98.5 F (36.9 C)  Temp Source Oral  Pulse Rate 77  Pulse Rate Source Dinamap  Resp 16  Respiratory Pattern Regular  BP 133/72  SpO2 100 %  Height and Weight  Height 5\' 6"  (1.676 m)  Height Method Stated  Weight 119.7 kg  Weight Method Actual  BMI (Calculated) 42.63  BSA (Calculated - sq m) 2.36 sq meters

## 2019-03-31 NOTE — Progress Notes (Signed)
PCP - Briscoe Deutscher, DO Cardiologist -   Chest x-ray -epic 2020  EKG -  Epic 2020 Stress Test -  ECHO -  Cardiac Cath -   Sleep Study -  CPAP -   Fasting Blood Sugar - 110s per patient  Checks Blood Sugar _avg twice a week ____ times a day  Blood Thinner Instructions: Aspirin Instructions: Last Dose:  Anesthesia review:   Patient denies shortness of breath, fever, cough and chest pain at PAT appointment   Patient verbalized understanding of instructions that were given to them at the PAT appointment. Patient was also instructed that they will need to review over the PAT instructions again at home before surgery.

## 2019-04-01 ENCOUNTER — Other Ambulatory Visit: Payer: Self-pay

## 2019-04-01 ENCOUNTER — Other Ambulatory Visit (HOSPITAL_COMMUNITY)
Admission: RE | Admit: 2019-04-01 | Discharge: 2019-04-01 | Disposition: A | Discharge status: Home / Self Care | Payer: 59 | Source: Ambulatory Visit | Attending: General Surgery | Admitting: General Surgery

## 2019-04-01 DIAGNOSIS — Z0184 Encounter for antibody response examination: Secondary | ICD-10-CM | POA: Diagnosis not present

## 2019-04-01 DIAGNOSIS — Z20828 Contact with and (suspected) exposure to other viral communicable diseases: Secondary | ICD-10-CM | POA: Diagnosis not present

## 2019-04-01 DIAGNOSIS — Z01812 Encounter for preprocedural laboratory examination: Secondary | ICD-10-CM | POA: Diagnosis not present

## 2019-04-03 LAB — NOVEL CORONAVIRUS, NAA (HOSP ORDER, SEND-OUT TO REF LAB; TAT 18-24 HRS): SARS-CoV-2, NAA: NOT DETECTED

## 2019-04-04 MED ORDER — GENTAMICIN SULFATE 40 MG/ML IJ SOLN
5.0000 mg/kg | INTRAVENOUS | Status: AC
  Administered 2019-04-05: 10:00:00 420 mg via INTRAVENOUS
  Filled 2019-04-04: qty 10.5

## 2019-04-04 MED ORDER — BUPIVACAINE LIPOSOME 1.3 % IJ SUSP
20.0000 mL | Freq: Once | INTRAMUSCULAR | Status: DC
  Filled 2019-04-04: qty 20

## 2019-04-04 NOTE — Anesthesia Preprocedure Evaluation (Addendum)
Anesthesia Evaluation  Patient identified by MRN, date of birth, ID band Patient awake    Reviewed: Allergy & Precautions, H&P , NPO status , Patient's Chart, lab work & pertinent test results  Airway Mallampati: III  TM Distance: >3 FB Neck ROM: Full    Dental no notable dental hx. (+) Teeth Intact, Dental Advisory Given   Pulmonary neg pulmonary ROS,    Pulmonary exam normal breath sounds clear to auscultation       Cardiovascular Exercise Tolerance: Good negative cardio ROS   Rhythm:Regular Rate:Normal     Neuro/Psych Anxiety negative neurological ROS  negative psych ROS   GI/Hepatic negative GI ROS, Neg liver ROS,   Endo/Other  diabetes, Type 2, Oral Hypoglycemic AgentsHypothyroidism Morbid obesity  Renal/GU negative Renal ROS  negative genitourinary   Musculoskeletal   Abdominal   Peds  Hematology negative hematology ROS (+)   Anesthesia Other Findings   Reproductive/Obstetrics negative OB ROS                            Anesthesia Physical Anesthesia Plan  ASA: III  Anesthesia Plan: General   Post-op Pain Management:    Induction: Intravenous  PONV Risk Score and Plan: 3 and 4 or greater and Ondansetron, Dexamethasone, Midazolam and Scopolamine patch - Pre-op  Airway Management Planned: Oral ETT  Additional Equipment:   Intra-op Plan:   Post-operative Plan: Extubation in OR  Informed Consent: I have reviewed the patients History and Physical, chart, labs and discussed the procedure including the risks, benefits and alternatives for the proposed anesthesia with the patient or authorized representative who has indicated his/her understanding and acceptance.     Dental advisory given  Plan Discussed with: CRNA  Anesthesia Plan Comments:         Anesthesia Quick Evaluation

## 2019-04-05 ENCOUNTER — Other Ambulatory Visit: Payer: Self-pay | Admitting: Family Medicine

## 2019-04-05 ENCOUNTER — Inpatient Hospital Stay (HOSPITAL_COMMUNITY): Payer: 59

## 2019-04-05 ENCOUNTER — Encounter (HOSPITAL_COMMUNITY)
Admission: RE | Disposition: A | Discharge status: Home / Self Care | Payer: Self-pay | Source: Home / Self Care | Attending: General Surgery

## 2019-04-05 ENCOUNTER — Inpatient Hospital Stay (HOSPITAL_COMMUNITY)
Admission: RE | Admit: 2019-04-05 | Discharge: 2019-04-06 | DRG: 621 | Disposition: A | Discharge status: Home / Self Care | Payer: 59 | Attending: General Surgery | Admitting: General Surgery

## 2019-04-05 ENCOUNTER — Other Ambulatory Visit: Payer: Self-pay

## 2019-04-05 ENCOUNTER — Encounter (HOSPITAL_COMMUNITY): Payer: Self-pay

## 2019-04-05 DIAGNOSIS — G8929 Other chronic pain: Secondary | ICD-10-CM | POA: Diagnosis present

## 2019-04-05 DIAGNOSIS — Z79899 Other long term (current) drug therapy: Secondary | ICD-10-CM | POA: Diagnosis not present

## 2019-04-05 DIAGNOSIS — E039 Hypothyroidism, unspecified: Secondary | ICD-10-CM | POA: Diagnosis present

## 2019-04-05 DIAGNOSIS — E785 Hyperlipidemia, unspecified: Secondary | ICD-10-CM | POA: Diagnosis present

## 2019-04-05 DIAGNOSIS — Z9884 Bariatric surgery status: Secondary | ICD-10-CM

## 2019-04-05 DIAGNOSIS — Z6841 Body Mass Index (BMI) 40.0 and over, adult: Secondary | ICD-10-CM

## 2019-04-05 DIAGNOSIS — Z7984 Long term (current) use of oral hypoglycemic drugs: Secondary | ICD-10-CM | POA: Diagnosis not present

## 2019-04-05 DIAGNOSIS — M25561 Pain in right knee: Secondary | ICD-10-CM | POA: Diagnosis present

## 2019-04-05 DIAGNOSIS — Z833 Family history of diabetes mellitus: Secondary | ICD-10-CM | POA: Diagnosis not present

## 2019-04-05 DIAGNOSIS — K2950 Unspecified chronic gastritis without bleeding: Secondary | ICD-10-CM | POA: Diagnosis not present

## 2019-04-05 DIAGNOSIS — Z8619 Personal history of other infectious and parasitic diseases: Secondary | ICD-10-CM | POA: Diagnosis not present

## 2019-04-05 DIAGNOSIS — F419 Anxiety disorder, unspecified: Secondary | ICD-10-CM | POA: Diagnosis present

## 2019-04-05 DIAGNOSIS — N926 Irregular menstruation, unspecified: Secondary | ICD-10-CM | POA: Diagnosis present

## 2019-04-05 DIAGNOSIS — Z79890 Hormone replacement therapy (postmenopausal): Secondary | ICD-10-CM

## 2019-04-05 DIAGNOSIS — E119 Type 2 diabetes mellitus without complications: Secondary | ICD-10-CM

## 2019-04-05 DIAGNOSIS — E282 Polycystic ovarian syndrome: Secondary | ICD-10-CM | POA: Diagnosis present

## 2019-04-05 DIAGNOSIS — E1169 Type 2 diabetes mellitus with other specified complication: Secondary | ICD-10-CM | POA: Diagnosis present

## 2019-04-05 DIAGNOSIS — Z880 Allergy status to penicillin: Secondary | ICD-10-CM | POA: Diagnosis not present

## 2019-04-05 DIAGNOSIS — K317 Polyp of stomach and duodenum: Secondary | ICD-10-CM | POA: Diagnosis not present

## 2019-04-05 HISTORY — PX: LAPAROSCOPIC GASTRIC SLEEVE RESECTION: SHX5895

## 2019-04-05 LAB — GLUCOSE, CAPILLARY
Glucose-Capillary: 117 mg/dL — ABNORMAL HIGH (ref 70–99)
Glucose-Capillary: 121 mg/dL — ABNORMAL HIGH (ref 70–99)
Glucose-Capillary: 176 mg/dL — ABNORMAL HIGH (ref 70–99)

## 2019-04-05 LAB — TYPE AND SCREEN
ABO/RH(D): A POS
Antibody Screen: NEGATIVE

## 2019-04-05 LAB — PREGNANCY, URINE: Preg Test, Ur: NEGATIVE

## 2019-04-05 LAB — HEMOGLOBIN AND HEMATOCRIT, BLOOD
HCT: 41.7 % (ref 36.0–46.0)
Hemoglobin: 13.4 g/dL (ref 12.0–15.0)

## 2019-04-05 SURGERY — GASTRECTOMY, SLEEVE, LAPAROSCOPIC
Anesthesia: General | Site: Abdomen

## 2019-04-05 MED ORDER — SODIUM CHLORIDE (PF) 0.9 % IJ SOLN
INTRAMUSCULAR | Status: AC
  Filled 2019-04-05: qty 50

## 2019-04-05 MED ORDER — INSULIN ASPART 100 UNIT/ML ~~LOC~~ SOLN
0.0000 [IU] | SUBCUTANEOUS | Status: DC
  Administered 2019-04-05 – 2019-04-06 (×3): 3 [IU] via SUBCUTANEOUS
  Administered 2019-04-06 (×2): 2 [IU] via SUBCUTANEOUS

## 2019-04-05 MED ORDER — POTASSIUM CHLORIDE IN NACL 20-0.45 MEQ/L-% IV SOLN
INTRAVENOUS | Status: DC
  Administered 2019-04-05 – 2019-04-06 (×2): via INTRAVENOUS
  Filled 2019-04-05 (×3): qty 1000

## 2019-04-05 MED ORDER — CHLORHEXIDINE GLUCONATE 4 % EX LIQD: 60.0000 mL | Freq: Once | CUTANEOUS | Status: DC

## 2019-04-05 MED ORDER — CLINDAMYCIN PHOSPHATE 900 MG/50ML IV SOLN
INTRAVENOUS | Status: DC | PRN
  Administered 2019-04-05: 900 mg via INTRAVENOUS

## 2019-04-05 MED ORDER — SUCCINYLCHOLINE CHLORIDE 200 MG/10ML IV SOSY
PREFILLED_SYRINGE | INTRAVENOUS | Status: DC | PRN
  Administered 2019-04-05: 100 mg via INTRAVENOUS

## 2019-04-05 MED ORDER — DEXAMETHASONE SODIUM PHOSPHATE 4 MG/ML IJ SOLN: 4.0000 mg | INTRAMUSCULAR | Status: DC

## 2019-04-05 MED ORDER — 0.9 % SODIUM CHLORIDE (POUR BTL) OPTIME
TOPICAL | Status: DC | PRN
  Administered 2019-04-05: 1000 mL

## 2019-04-05 MED ORDER — SIMETHICONE 80 MG PO CHEW: 80.0000 mg | CHEWABLE_TABLET | Freq: Four times a day (QID) | ORAL | Status: DC | PRN

## 2019-04-05 MED ORDER — LACTATED RINGERS IR SOLN
Status: DC | PRN
  Administered 2019-04-05: 1000 mL

## 2019-04-05 MED ORDER — SUGAMMADEX SODIUM 200 MG/2ML IV SOLN
INTRAVENOUS | Status: DC | PRN
  Administered 2019-04-05: 200 mg via INTRAVENOUS

## 2019-04-05 MED ORDER — FENTANYL CITRATE (PF) 100 MCG/2ML IJ SOLN
INTRAMUSCULAR | Status: AC
  Filled 2019-04-05: qty 2

## 2019-04-05 MED ORDER — FENTANYL CITRATE (PF) 250 MCG/5ML IJ SOLN
INTRAMUSCULAR | Status: DC | PRN
  Administered 2019-04-05: 100 ug via INTRAVENOUS
  Administered 2019-04-05 (×2): 50 ug via INTRAVENOUS

## 2019-04-05 MED ORDER — STERILE WATER FOR IRRIGATION IR SOLN
Status: DC | PRN
  Administered 2019-04-05: 1000 mL

## 2019-04-05 MED ORDER — OXYCODONE HCL 5 MG/5ML PO SOLN: 5.0000 mg | Freq: Four times a day (QID) | ORAL | Status: DC | PRN

## 2019-04-05 MED ORDER — SUCCINYLCHOLINE CHLORIDE 200 MG/10ML IV SOSY
PREFILLED_SYRINGE | INTRAVENOUS | Status: AC
  Filled 2019-04-05: qty 10

## 2019-04-05 MED ORDER — PHENYLEPHRINE 40 MCG/ML (10ML) SYRINGE FOR IV PUSH (FOR BLOOD PRESSURE SUPPORT)
PREFILLED_SYRINGE | INTRAVENOUS | Status: DC | PRN
  Administered 2019-04-05: 80 ug via INTRAVENOUS
  Administered 2019-04-05: 40 ug via INTRAVENOUS
  Administered 2019-04-05 (×2): 80 ug via INTRAVENOUS

## 2019-04-05 MED ORDER — PHENYLEPHRINE 40 MCG/ML (10ML) SYRINGE FOR IV PUSH (FOR BLOOD PRESSURE SUPPORT)
PREFILLED_SYRINGE | INTRAVENOUS | Status: AC
  Filled 2019-04-05: qty 10

## 2019-04-05 MED ORDER — ONDANSETRON HCL 4 MG/2ML IJ SOLN
INTRAMUSCULAR | Status: DC | PRN
  Administered 2019-04-05: 4 mg via INTRAVENOUS

## 2019-04-05 MED ORDER — HYDROMORPHONE HCL 1 MG/ML IJ SOLN
INTRAMUSCULAR | Status: AC
  Filled 2019-04-05: qty 1

## 2019-04-05 MED ORDER — KETAMINE HCL 10 MG/ML IJ SOLN
INTRAMUSCULAR | Status: DC | PRN
  Administered 2019-04-05: 40 mg via INTRAVENOUS

## 2019-04-05 MED ORDER — SCOPOLAMINE 1 MG/3DAYS TD PT72
1.0000 | MEDICATED_PATCH | TRANSDERMAL | Status: DC
  Administered 2019-04-05: 1.5 mg via TRANSDERMAL
  Filled 2019-04-05: qty 1

## 2019-04-05 MED ORDER — ACETAMINOPHEN 500 MG PO TABS
1000.0000 mg | ORAL_TABLET | ORAL | Status: AC
  Administered 2019-04-05: 1000 mg via ORAL
  Filled 2019-04-05: qty 2

## 2019-04-05 MED ORDER — HEPARIN SODIUM (PORCINE) 5000 UNIT/ML IJ SOLN
5000.0000 [IU] | INTRAMUSCULAR | Status: AC
  Administered 2019-04-05: 5000 [IU] via SUBCUTANEOUS
  Filled 2019-04-05: qty 1

## 2019-04-05 MED ORDER — BUPIVACAINE LIPOSOME 1.3 % IJ SUSP
20.0000 mL | Freq: Once | INTRAMUSCULAR | Status: AC
  Administered 2019-04-05: 20 mL
  Filled 2019-04-05: qty 20

## 2019-04-05 MED ORDER — ENSURE MAX PROTEIN PO LIQD
2.0000 [oz_av] | ORAL | Status: DC
  Administered 2019-04-06: 2 [oz_av] via ORAL

## 2019-04-05 MED ORDER — ROCURONIUM BROMIDE 10 MG/ML (PF) SYRINGE
PREFILLED_SYRINGE | INTRAVENOUS | Status: AC
  Filled 2019-04-05: qty 10

## 2019-04-05 MED ORDER — ACETAMINOPHEN 500 MG PO TABS
1000.0000 mg | ORAL_TABLET | Freq: Three times a day (TID) | ORAL | Status: DC
  Administered 2019-04-05 – 2019-04-06 (×2): 1000 mg via ORAL
  Filled 2019-04-05 (×2): qty 2

## 2019-04-05 MED ORDER — MORPHINE SULFATE (PF) 4 MG/ML IV SOLN
INTRAVENOUS | Status: AC
  Administered 2019-04-05: 2 mg via INTRAVENOUS
  Filled 2019-04-05: qty 1

## 2019-04-05 MED ORDER — MORPHINE SULFATE (PF) 4 MG/ML IV SOLN
1.0000 mg | INTRAVENOUS | Status: DC | PRN
  Administered 2019-04-05 (×2): 2 mg via INTRAVENOUS

## 2019-04-05 MED ORDER — MIDAZOLAM HCL 5 MG/5ML IJ SOLN
INTRAMUSCULAR | Status: DC | PRN
  Administered 2019-04-05: 2 mg via INTRAVENOUS

## 2019-04-05 MED ORDER — SODIUM CHLORIDE (PF) 0.9 % IJ SOLN
INTRAMUSCULAR | Status: DC | PRN
  Administered 2019-04-05: 10 mL

## 2019-04-05 MED ORDER — ENOXAPARIN SODIUM 30 MG/0.3ML ~~LOC~~ SOLN
30.0000 mg | Freq: Two times a day (BID) | SUBCUTANEOUS | Status: DC
  Administered 2019-04-05 – 2019-04-06 (×2): 30 mg via SUBCUTANEOUS
  Filled 2019-04-05 (×2): qty 0.3

## 2019-04-05 MED ORDER — ONDANSETRON HCL 4 MG/2ML IJ SOLN: 4.0000 mg | Freq: Four times a day (QID) | INTRAMUSCULAR | Status: DC | PRN

## 2019-04-05 MED ORDER — DEXAMETHASONE SODIUM PHOSPHATE 10 MG/ML IJ SOLN
INTRAMUSCULAR | Status: AC
  Filled 2019-04-05: qty 1

## 2019-04-05 MED ORDER — GABAPENTIN 100 MG PO CAPS
200.0000 mg | ORAL_CAPSULE | Freq: Two times a day (BID) | ORAL | Status: DC
  Administered 2019-04-05 – 2019-04-06 (×2): 200 mg via ORAL
  Filled 2019-04-05 (×2): qty 2

## 2019-04-05 MED ORDER — ACETAMINOPHEN 160 MG/5ML PO SOLN
1000.0000 mg | Freq: Three times a day (TID) | ORAL | Status: DC
  Administered 2019-04-05: 1000 mg via ORAL
  Filled 2019-04-05: qty 40.6

## 2019-04-05 MED ORDER — MIDAZOLAM HCL 2 MG/2ML IJ SOLN
INTRAMUSCULAR | Status: AC
  Filled 2019-04-05: qty 2

## 2019-04-05 MED ORDER — PROPOFOL 10 MG/ML IV BOLUS
INTRAVENOUS | Status: DC | PRN
  Administered 2019-04-05: 200 mg via INTRAVENOUS

## 2019-04-05 MED ORDER — LIDOCAINE 2% (20 MG/ML) 5 ML SYRINGE
INTRAMUSCULAR | Status: DC | PRN
  Administered 2019-04-05: 80 mg via INTRAVENOUS

## 2019-04-05 MED ORDER — GLYCOPYRROLATE PF 0.2 MG/ML IJ SOSY
PREFILLED_SYRINGE | INTRAMUSCULAR | Status: DC | PRN
  Administered 2019-04-05: .1 mg via INTRAVENOUS

## 2019-04-05 MED ORDER — DEXAMETHASONE SODIUM PHOSPHATE 10 MG/ML IJ SOLN
INTRAMUSCULAR | Status: DC | PRN
  Administered 2019-04-05: 4 mg via INTRAVENOUS

## 2019-04-05 MED ORDER — LIDOCAINE 2% (20 MG/ML) 5 ML SYRINGE
INTRAMUSCULAR | Status: DC | PRN
  Administered 2019-04-05: 1.5 mg/kg/h via INTRAVENOUS

## 2019-04-05 MED ORDER — LACTATED RINGERS IV SOLN
INTRAVENOUS | Status: DC
  Administered 2019-04-05 (×2): via INTRAVENOUS

## 2019-04-05 MED ORDER — PANTOPRAZOLE SODIUM 40 MG IV SOLR
40.0000 mg | Freq: Every day | INTRAVENOUS | Status: DC
  Administered 2019-04-05: 40 mg via INTRAVENOUS
  Filled 2019-04-05: qty 40

## 2019-04-05 MED ORDER — GLYCOPYRROLATE PF 0.2 MG/ML IJ SOSY
PREFILLED_SYRINGE | INTRAMUSCULAR | Status: AC
  Filled 2019-04-05: qty 1

## 2019-04-05 MED ORDER — CLINDAMYCIN PHOSPHATE 900 MG/50ML IV SOLN
INTRAVENOUS | Status: AC
  Filled 2019-04-05: qty 50

## 2019-04-05 MED ORDER — APREPITANT 40 MG PO CAPS
40.0000 mg | ORAL_CAPSULE | ORAL | Status: AC
  Administered 2019-04-05: 40 mg via ORAL
  Filled 2019-04-05: qty 1

## 2019-04-05 MED ORDER — HYDROMORPHONE HCL 1 MG/ML IJ SOLN
0.2500 mg | INTRAMUSCULAR | Status: DC | PRN
  Administered 2019-04-05 (×2): 0.5 mg via INTRAVENOUS

## 2019-04-05 MED ORDER — GABAPENTIN 300 MG PO CAPS
300.0000 mg | ORAL_CAPSULE | ORAL | Status: AC
  Administered 2019-04-05: 300 mg via ORAL
  Filled 2019-04-05: qty 1

## 2019-04-05 MED ORDER — ONDANSETRON HCL 4 MG/2ML IJ SOLN
INTRAMUSCULAR | Status: AC
  Filled 2019-04-05: qty 2

## 2019-04-05 MED ORDER — ROCURONIUM BROMIDE 10 MG/ML (PF) SYRINGE
PREFILLED_SYRINGE | INTRAVENOUS | Status: DC | PRN
  Administered 2019-04-05: 60 mg via INTRAVENOUS

## 2019-04-05 MED FILL — TRULICITY 1.5 MG/0.5 ML PEN: 1.5 | 84 days supply | Qty: 6 | Fill #0

## 2019-04-05 SURGICAL SUPPLY — 88 items
APL PRP STRL LF DISP 70% ISPRP (MISCELLANEOUS) ×2
APL SKNCLS STERI-STRIP NONHPOA (GAUZE/BANDAGES/DRESSINGS) ×1
APL SRG 32X5 SNPLK LF DISP (MISCELLANEOUS)
APL SWBSTK 6 STRL LF DISP (MISCELLANEOUS)
APPLICATOR COTTON TIP 6 STRL (MISCELLANEOUS) IMPLANT
APPLICATOR COTTON TIP 6IN STRL (MISCELLANEOUS)
APPLIER CLIP ROT 10 11.4 M/L (STAPLE)
APPLIER CLIP ROT 13.4 12 LRG (CLIP)
APR CLP LRG 13.4X12 ROT 20 MLT (CLIP)
APR CLP MED LRG 11.4X10 (STAPLE)
BENZOIN TINCTURE PRP APPL 2/3 (GAUZE/BANDAGES/DRESSINGS) ×2 IMPLANT
BLADE SURG SZ11 CARB STEEL (BLADE) ×2 IMPLANT
BNDG ADH 1X3 SHEER STRL LF (GAUZE/BANDAGES/DRESSINGS) ×12 IMPLANT
BNDG ADH THN 3X1 STRL LF (GAUZE/BANDAGES/DRESSINGS) ×6
CABLE HIGH FREQUENCY MONO STRZ (ELECTRODE) ×2 IMPLANT
CHLORAPREP W/TINT 26 (MISCELLANEOUS) ×4 IMPLANT
CLIP APPLIE ROT 10 11.4 M/L (STAPLE) IMPLANT
CLIP APPLIE ROT 13.4 12 LRG (CLIP) IMPLANT
COVER SURGICAL LIGHT HANDLE (MISCELLANEOUS) ×2 IMPLANT
COVER WAND RF STERILE (DRAPES) IMPLANT
DECANTER SPIKE VIAL GLASS SM (MISCELLANEOUS) ×2 IMPLANT
DEVICE SUT QUICK LOAD TK 5 (STAPLE) IMPLANT
DEVICE SUT TI-KNOT TK 5X26 (MISCELLANEOUS) IMPLANT
DEVICE SUTURE ENDOST 10MM (ENDOMECHANICALS) IMPLANT
DISSECTOR BLUNT TIP ENDO 5MM (MISCELLANEOUS) IMPLANT
DRAPE UTILITY XL STRL (DRAPES) ×4 IMPLANT
ELECT L-HOOK LAP 45CM DISP (ELECTROSURGICAL)
ELECT PENCIL ROCKER SW 15FT (MISCELLANEOUS) IMPLANT
ELECT REM PT RETURN 15FT ADLT (MISCELLANEOUS) ×2 IMPLANT
ELECTRODE L-HOOK LAP 45CM DISP (ELECTROSURGICAL) IMPLANT
GLOVE BIO SURGEON STRL SZ 6.5 (GLOVE) ×1 IMPLANT
GLOVE BIO SURGEON STRL SZ7.5 (GLOVE) ×2 IMPLANT
GLOVE BIOGEL PI IND STRL 7.0 (GLOVE) IMPLANT
GLOVE BIOGEL PI IND STRL 7.5 (GLOVE) IMPLANT
GLOVE BIOGEL PI INDICATOR 7.0 (GLOVE) ×1
GLOVE BIOGEL PI INDICATOR 7.5 (GLOVE) ×2
GLOVE ECLIPSE 6.5 STRL STRAW (GLOVE) ×1 IMPLANT
GLOVE INDICATOR 6.5 STRL GRN (GLOVE) ×1 IMPLANT
GLOVE INDICATOR 8.0 STRL GRN (GLOVE) ×2 IMPLANT
GLOVE SURG SS PI 7.0 STRL IVOR (GLOVE) ×1 IMPLANT
GOWN STRL REUS W/ TWL LRG LVL3 (GOWN DISPOSABLE) IMPLANT
GOWN STRL REUS W/TWL LRG LVL3 (GOWN DISPOSABLE) ×4
GOWN STRL REUS W/TWL XL LVL3 (GOWN DISPOSABLE) ×6 IMPLANT
GRASPER SUT TROCAR 14GX15 (MISCELLANEOUS) ×2 IMPLANT
HOVERMATT SINGLE USE (MISCELLANEOUS) ×2 IMPLANT
KIT BASIN OR (CUSTOM PROCEDURE TRAY) ×2 IMPLANT
KIT TURNOVER KIT A (KITS) IMPLANT
MARKER SKIN DUAL TIP RULER LAB (MISCELLANEOUS) ×2 IMPLANT
NDL SPNL 22GX3.5 QUINCKE BK (NEEDLE) ×1 IMPLANT
NEEDLE SPNL 22GX3.5 QUINCKE BK (NEEDLE) ×2 IMPLANT
PACK UNIVERSAL I (CUSTOM PROCEDURE TRAY) ×2 IMPLANT
PENCIL SMOKE EVACUATOR (MISCELLANEOUS) IMPLANT
RELOAD STAPLE 60 3.6 BLU REG (STAPLE) ×1 IMPLANT
RELOAD STAPLE 60 3.8 GOLD REG (STAPLE) IMPLANT
RELOAD STAPLE 60 4.1 GRN THCK (STAPLE) ×1 IMPLANT
RELOAD STAPLE 60 BLK VRY/THCK (STAPLE) IMPLANT
RELOAD STAPLER 60MM BLK (STAPLE) IMPLANT
RELOAD STAPLER BLUE 60MM (STAPLE) ×4 IMPLANT
RELOAD STAPLER GOLD 60MM (STAPLE) ×1 IMPLANT
RELOAD STAPLER GREEN 60MM (STAPLE) ×1 IMPLANT
SCISSORS LAP 5X45 EPIX DISP (ENDOMECHANICALS) IMPLANT
SEALANT SURGICAL APPL DUAL CAN (MISCELLANEOUS) IMPLANT
SET IRRIG TUBING LAPAROSCOPIC (IRRIGATION / IRRIGATOR) ×2 IMPLANT
SET TUBE SMOKE EVAC HIGH FLOW (TUBING) ×2 IMPLANT
SHEARS HARMONIC ACE PLUS 45CM (MISCELLANEOUS) ×2 IMPLANT
SLEEVE GASTRECTOMY 40FR VISIGI (MISCELLANEOUS) ×2 IMPLANT
SLEEVE XCEL OPT CAN 5 100 (ENDOMECHANICALS) ×6 IMPLANT
SOL ANTI FOG 6CC (MISCELLANEOUS) ×1 IMPLANT
SOLUTION ANTI FOG 6CC (MISCELLANEOUS) ×1
SPONGE LAP 18X18 RF (DISPOSABLE) ×2 IMPLANT
STAPLER ECHELON BIOABSB 60 FLE (MISCELLANEOUS) ×8 IMPLANT
STAPLER ECHELON LONG 60 440 (INSTRUMENTS) ×2 IMPLANT
STAPLER RELOAD 60MM BLK (STAPLE)
STAPLER RELOAD BLUE 60MM (STAPLE) ×8
STAPLER RELOAD GOLD 60MM (STAPLE) ×2
STAPLER RELOAD GREEN 60MM (STAPLE) ×2
STRIP CLOSURE SKIN 1/2X4 (GAUZE/BANDAGES/DRESSINGS) ×2 IMPLANT
SUT MNCRL AB 4-0 PS2 18 (SUTURE) ×2 IMPLANT
SUT SURGIDAC NAB ES-9 0 48 120 (SUTURE) IMPLANT
SUT VICRYL 0 TIES 12 18 (SUTURE) ×2 IMPLANT
SYR 20ML LL LF (SYRINGE) ×2 IMPLANT
SYR 50ML LL SCALE MARK (SYRINGE) ×2 IMPLANT
TOWEL OR 17X26 10 PK STRL BLUE (TOWEL DISPOSABLE) ×2 IMPLANT
TOWEL OR NON WOVEN STRL DISP B (DISPOSABLE) ×2 IMPLANT
TROCAR BLADELESS 15MM (ENDOMECHANICALS) ×2 IMPLANT
TROCAR BLADELESS OPT 5 100 (ENDOMECHANICALS) ×2 IMPLANT
TUBING CONNECTING 10 (TUBING) ×3 IMPLANT
TUBING ENDO SMARTCAP (MISCELLANEOUS) ×2 IMPLANT

## 2019-04-05 NOTE — Interval H&P Note (Signed)
History and Physical Interval Note:  04/05/2019 9:28 AM  Tammy Mooney  has presented today for surgery, with the diagnosis of Morbid Obesity, Hypothryoidism, DM II.  The various methods of treatment have been discussed with the patient and family. After consideration of risks, benefits and other options for treatment, the patient has consented to  Procedure(s): LAPAROSCOPIC GASTRIC SLEEVE RESECTION, UPPER ENDO, ERAS Pathway (N/A) as a surgical intervention.  The patient's history has been reviewed, patient examined, no change in status, stable for surgery.  I have reviewed the patient's chart and labs.  Questions were answered to the patient's satisfaction.     Greer Pickerel

## 2019-04-05 NOTE — Op Note (Signed)
04/05/2019 Tammy Mooney 11-27-1980 [id number]   PRE-OPERATIVE DIAGNOSIS:   Principal Problem:   Class 3 severe obesity with serious comorbidity and body mass index (BMI) of 42.6   PCOS (polycystic ovarian syndrome)   Type 2 diabetes mellitus without complication, without long-term current use of insulin ([name])   Dyslipidemia associated with type 2 diabetes mellitus ([name])   Chronic pain of right knee  POST-OPERATIVE DIAGNOSIS:  same  PROCEDURE:  Procedure(s): LAPAROSCOPIC SLEEVE GASTRECTOMY  UPPER GI ENDOSCOPY  SURGEON:  Surgeon(s): Gayland Curry, MD FACS FASMBS  ASSISTANTS: Romana Juniper MD FACS  ANESTHESIA:   general  DRAINS: none   BOUGIE: 40 fr ViSiGi  LOCAL MEDICATIONS USED:   Exparel  EBL: minimal  SPECIMEN:  Source of Specimen:  Greater curvature of stomach  DISPOSITION OF SPECIMEN:  PATHOLOGY  COUNTS:  YES  INDICATION FOR PROCEDURE: This is a very pleasant 38 y.o.-year-oldar-old morbidly obese female who has had unsuccessful attempts for sustained weight loss. The patient presents today for a planned laparoscopic sleeve gastrectomy with upper endoscopy. We have discussed the risk and benefits of the procedure extensively preoperatively. Please see my separate notes.  PROCEDURE: After obtaining informed consent and receiving 5000 units of subcutaneous heparin, the patient was brought to the operating room at [facility] and placed supine on the operating room table. General endotracheal anesthesia was established. Sequential compression devices were placed. A orogastric tube was placed. The patient's abdomen was prepped and draped in the usual standard surgical fashion. The patient received preoperative IV antibiotic. A surgical timeout was performed. ERAS protocol used.   Access to the abdomen was achieved using a 5 mm 0 laparoscope thru a 5 mm trocar In the left upper Quadrant 2 fingerbreadths below the left subcostal margin using the Optiview technique.  Pneumoperitoneum was smoothly established up to 15 mm of mercury. The laparoscope was advanced and the abdominal cavity was surveilled. The patient was then placed in reverse Trendelenburg.   A 5 mm trocar was placed slightly above and to the left of the umbilicus under direct visualization.  The [facility] liver retractor was placed under the left lobe of the liver through a 5 mm trocar incision site in the subxiphoid position. A 5 mm trocar was placed in the lateral right upper quadrant along with a 15 mm trocar in the mid right abdomen. A final 5 mm trocar was placed in the lateral LUQ.  All under direct visualization after exparel had been infiltrated in bilateral lateral upper abdominal walls as a TAP block.  The stomach was inspected. It was completely decompressed and the orogastric tube was removed.  There was no anterior dimple that was obviously visible. Her preop UGI showed no hiatal hernia.   We identified the pylorus and measured 6 cm proximal to the pylorus and identified an area of where we would start taking down the short gastric vessels. Harmonic scalpel was used to take down the short gastric vessels along the greater curvature of the stomach. We were able to enter the lesser sac. We continued to march along the greater curvature of the stomach taking down the short gastrics. As we approached the gastrosplenic ligament we took care in this area not to injure the spleen. We were able to take down the entire gastrosplenic ligament. We then mobilized the fundus away from the left crus of diaphragm. There were not any significant posterior gastric avascular attachments. This left the stomach completely mobilized. No vessels had been taken down along  the lesser curvature of the stomach.  We then reidentified the pylorus. A 40Fr ViSiGi was then placed in the oropharynx and advanced down into the stomach and placed in the distal antrum and positioned along the lesser curvature. It was placed under  suction which secured the 40Fr ViSiGi in place along the lesser curve. Then using the [employer] echelon 60 mm stapler with a green load with Seamguard, I placed a stapler along the antrum approximately 5 cm from the pylorus. The stapler was angled so that there is ample room at the angularis incisura. I then fired the first staple load after inspecting it posteriorly to ensure adequate space both anteriorly and posteriorly. At this point I still was not completely past the angularis so with a gold load with Seamguard, I placed the stapler in position just inside the prior stapleline. We then rotated the stomach to insure that there was adequate anteriorly as well as posteriorly. The stapler was then fired.  At this point I started using 60 mm blue load staple cartridges with Seamguard. The echelon stapler was then repositioned with a 60 mm blue load with Seamguard and we continued to march up along the [place]. My assistant was holding traction along the greater curvature stomach along the cauterized short gastric vessels ensuring that the stomach was symmetrically retracted. Prior to each firing of the staple, we rotated the stomach to ensure that there is adequate stomach left.  As we approached the fundus, I used 60 mm blue cartridge with Seamguard aiming  lateral to the GE junction after mobilizing some of the esophageal fat pad.  The sleeve was inspected. There is no evidence of cork screw. The staple line appeared hemostatic. The CRNA inflated the ViSiGi to the green zone and the upper abdomen was flooded with saline. There were no bubbles. The sleeve was decompressed and the ViSiGi removed. My assistant scrubbed out and performed an upper endoscopy. The sleeve easily distended with air and the scope was easily advanced to the pylorus. There is no evidence of internal bleeding or cork screwing. There was no narrowing at the angularis. There is no evidence of bubbles. Please see his operative note for further  details. The gastric sleeve was decompressed and the endoscope was removed.  The greater curvature the stomach was grasped with a laparoscopic grasper and removed from the 15 mm trocar site.  The liver retractor was removed. I then closed the 15 mm trocar site with 1 interrupted 0 Vicryl sutures through the fascia using the endoclose. The closure was viewed laparoscopically and it was airtight. Remaining Exparel was then infiltrated in the preperitoneal spaces around the trocar sites. Pneumoperitoneum was released. All trocar sites were closed with a 4-0 Monocryl in a subcuticular fashion followed by the application of benzoin, steri-strips, and bandaids. The patient was extubated and taken to the recovery room in stable condition. All needle, instrument, and sponge counts were correct x2. There are no immediate complications  (1) 60 mm green with Seamguard (1) 60 mm gold with seamguard (4) 60 mm blue with 3 seamguard  PLAN OF CARE: Admit to inpatient   PATIENT DISPOSITION:  PACU - hemodynamically stable.   Delay start of Pharmacological VTE agent (>24hrs) due to surgical blood loss or risk of bleeding:  no  Leighton Ruff. Redmond Pulling, MD, FACS FASMBS General, Bariatric, & Minimally Invasive Surgery [facility], [place]

## 2019-04-05 NOTE — Op Note (Signed)
Preoperative diagnosis: laparoscopic sleeve gastrectomy  Postoperative diagnosis: Same   Procedure: Upper endoscopy   Surgeon: Clovis Riley, M.D.  Anesthesia: Gen.   Description of procedure: The endoscopy was placed in the mouth and into the oropharynx and under endoscopic vision it was advanced to the esophagogastric junction which was identified at 40 from the teeth.  The pouch was tensely insufflated while the upper abdomen was flooded with irrigation to perform a leak test, which was negative. No bubbles were seen.  The staple line was hemostatic and the lumen was evenly tubular without undue narrowing, angulation or twisting specifically at the incisura angularis. The lumen was decompressed and the scope was withdrawn without difficulty.    Clovis Riley, M.D. General, Bariatric, & Minimally Invasive Surgery [facility] [place]

## 2019-04-05 NOTE — Anesthesia Postprocedure Evaluation (Signed)
Anesthesia Post Note  Patient: Tammy Mooney  Procedure(s) Performed: LAPAROSCOPIC GASTRIC SLEEVE RESECTION, UPPER ENDO, ERAS Pathway (N/A Abdomen)     Patient location during evaluation: PACU Anesthesia Type: General Level of consciousness: awake and alert Pain management: pain level controlled Vital Signs Assessment: post-procedure vital signs reviewed and stable Respiratory status: spontaneous breathing, nonlabored ventilation, respiratory function stable and patient connected to nasal cannula oxygen Cardiovascular status: blood pressure returned to baseline and stable Postop Assessment: no apparent nausea or vomiting Anesthetic complications: no    Last Vitals:  Vitals:   04/05/19 1200 04/05/19 1215  BP: 136/90 138/88  Pulse: 89 89  Resp: 14 11  Temp:    SpO2: 99% 98%    Last Pain:  Vitals:   04/05/19 1215  TempSrc:   PainSc: 5                  Evens Meno,W. EDMOND

## 2019-04-05 NOTE — Addendum Note (Signed)
Addendum  created 04/05/19 1329 by Mitzie Na, CRNA   Intraprocedure Flowsheets edited

## 2019-04-05 NOTE — Progress Notes (Signed)
Patient has walked, voided and sat in chair. Patient started intake of water at 1630 with  no reported nausea. Patient reaching 2500 on incentive spirometer.

## 2019-04-05 NOTE — Progress Notes (Signed)
PHARMACY CONSULT FOR:  Risk Assessment for Post-Discharge VTE Following Bariatric Surgery  Post-Discharge VTE Risk Assessment: This patient's probability of 30-day post-discharge VTE is increased due to the factors marked:   Female    Age >/=60 years    BMI >/=50 kg/m2    CHF    Dyspnea at Rest    Paraplegia  x  Non-gastric-band surgery    Operation Time >/=3 hr    Return to OR     Length of Stay >/= 3 d      Hx of VTE   Hypercoagulable condition   Significant venous stasis   Predicted probability of 30-day post-discharge VTE: 0.16%  Other patient-specific factors to consider:   Recommendation for Discharge: No pharmacologic prophylaxis post-discharge     Tammy Mooney is a 38 y.o. female who underwent laparoscopic sleeve gastrectomy on 04/05/19    Case start: 1013 Case end: 1113   Allergies  Allergen Reactions  . Adhesive [Tape] Rash and Other (See Comments)    Plastic tape causes rash, paper tape is okay, tegaderm is ok  . Penicillins Rash    Patient Measurements: Height: 5\' 6"  (167.6 cm) Weight: 264 lb (119.7 kg) IBW/kg (Calculated) : 59.3 Body mass index is 42.61 kg/m.  Recent Labs    04/05/19 1152  HGB 13.4  HCT 41.7   Estimated Creatinine Clearance: 124.5 mL/min (by C-G formula based on SCr of 0.68 mg/dL).    Past Medical History:  Diagnosis Date  . Anxiety   . Coronavirus infection    01-2019  . DM (diabetes mellitus), type 2 ([place])   . Hypothyroidism   . Infertility, female   . Low HDL (under 40)   . PCOS (polycystic ovarian syndrome)   . Varicella   . Vertigo      Medications Prior to Admission  Medication Sig Dispense Refill Last Dose  . cetirizine (ZYRTEC) 10 MG tablet Take 10 mg by mouth daily as needed for allergies.    Past Month at Unknown time  . Cholecalciferol (VITAMIN D3) 125 MCG (5000 UT) CAPS Take 5,000 Units by mouth daily.   Past Month at Unknown time  . Dulaglutide (TRULICITY) 1.5 0000000 SOPN INJECT 1.5 MG INTO  THE SKIN ONCE A WEEK (Patient taking differently: Inject 1.5 mg into the skin every Thursday. ) 12 pen 1 03/29/2019 at Unknown time  . escitalopram (LEXAPRO) 20 MG tablet Take 1 tablet (20 mg total) by mouth daily. 90 tablet 1 04/04/2019 at Unknown time  . levothyroxine (SYNTHROID, LEVOTHROID) 112 MCG tablet Take one daily (Patient taking differently: Take 112 mcg by mouth daily before breakfast. ) 90 tablet 1 04/04/2019 at Unknown time  . metFORMIN (GLUCOPHAGE) 850 MG tablet TAKE 1 TABLET (850 MG TOTAL) BY MOUTH TWICE A DAY WITH A MEAL. 60 tablet 1 04/04/2019 at 0700  . Multiple Vitamin (MULTIVITAMIN PO) Take 1 tablet by mouth daily.   Past Month at Unknown time  . traZODone (DESYREL) 50 MG tablet Take 1 tablet (50 mg total) by mouth at bedtime. 90 tablet 2 04/03/2019 at Unknown time  . levonorgestrel (MIRENA) 20 MCG/24HR IUD 1 each by Intrauterine route once.   in place for 4 years  . meclizine (ANTIVERT) 25 MG tablet Take 1 tablet (25 mg total) by mouth 3 (three) times daily as needed for dizziness. 30 tablet 0 never taken       Tammy Mooney 04/05/2019,4:00 PM

## 2019-04-05 NOTE — Anesthesia Procedure Notes (Signed)
Procedure Name: Intubation Date/Time: 04/05/2019 9:56 AM Performed by: Mitzie Na, CRNA Pre-anesthesia Checklist: Patient identified, Emergency Drugs available, Suction available and Patient being monitored Patient Re-evaluated:Patient Re-evaluated prior to induction Oxygen Delivery Method: Circle system utilized Preoxygenation: Pre-oxygenation with 100% oxygen Induction Type: IV induction and Rapid sequence Laryngoscope Size: Mac and 3 Grade View: Grade I Tube type: Oral Tube size: 7.0 mm Number of attempts: 1 Airway Equipment and Method: Stylet and Oral airway Placement Confirmation: ETT inserted through vocal cords under direct vision,  positive ETCO2 and breath sounds checked- equal and bilateral Secured at: 24 cm Tube secured with: Tape Dental Injury: Teeth and Oropharynx as per pre-operative assessment

## 2019-04-05 NOTE — Discharge Instructions (Signed)
° ° ° °GASTRIC BYPASS/SLEEVE ° Home Care Instructions ° ° These instructions are to help you care for yourself when you go home. ° °Call: If you have any problems. °• Call [phone] and ask for the surgeon on call °• If you need immediate help, come to the ER at [facility].  °• Tell the ER staff that you are a new post-op gastric bypass or gastric sleeve patient °  °Signs and symptoms to report: • Severe vomiting or nausea °o If you cannot keep down clear liquids for longer than 1 day, call your surgeon  °• Abdominal pain that does not get better after taking your pain medication °• Fever over 100.4° F with chills °• Heart beating over 100 beats a minute °• Shortness of breath at rest °• Chest pain °•  Redness, swelling, drainage, or foul odor at incision (surgical) sites °•  If your incisions open or pull apart °• Swelling or pain in calf (lower leg) °• Diarrhea (Loose bowel movements that happen often), frequent watery, uncontrolled bowel movements °• Constipation, (no bowel movements for 3 days) if this happens: Pick one °o Milk of Magnesia, 2 tablespoons by mouth, 3 times a day for 2 days if needed °o Stop taking Milk of Magnesia once you have a bowel movement °o Call your doctor if constipation continues °Or °o Miralax  (instead of Milk of Magnesia) following the label instructions °o Stop taking Miralax once you have a bowel movement °o Call your doctor if constipation continues °• Anything you think is not normal °  °Normal side effects after surgery: • Unable to sleep at night or unable to focus °• Irritability or moody °• Being tearful (crying) or depressed °These are common complaints, possibly related to your anesthesia medications that put you to sleep, stress of surgery, and change in lifestyle.  This usually goes away a few weeks after surgery.  If these feelings continue, call your primary care doctor. °  °Wound Care: You may have surgical glue, steri-strips, or staples over your incisions after  surgery °• Surgical glue:  Looks like a clear film over your incisions and will wear off a little at a time °• Steri-strips: Strips of tape over your incisions. You may notice a yellowish color on the skin under the steri-strips. This is used to make the   steri-strips stick better. Do not pull the steri-strips off - let them fall off °• Staples: Staples may be removed before you leave the hospital °o If you go home with staples, call [facility], [phone] at for an appointment with your surgeon’s nurse to have staples removed 10 days after surgery. °• Showering: You may shower two (2) days after your surgery unless your surgeon tells you differently °o Wash gently around incisions with warm soapy water, rinse well, and gently pat dry  °o No tub baths until staples are removed, steri-strips fall off or glue is gone.  °  °Medications: • Medications should be liquid or crushed if larger than the size of a dime °• Extended release pills (medication that release a little bit at a time through the day) should NOT be crushed or cut. (examples include XL, ER, DR, SR) °• Depending on the size and number of medications you take, you may need to space (take a few throughout the day)/change the time you take your medications so that you do not over-fill your pouch (smaller stomach) °• Make sure you follow-up with your primary care doctor to   make medication changes needed during rapid weight loss and life-style changes °• If you have diabetes, follow up with the doctor that orders your diabetes medication(s) within one week after surgery and check your blood sugar regularly. °• Do not drive while taking prescription pain medication  °• It is ok to take Tylenol by the bottle instructions with your pain medicine or instead of your pain medicine as needed.  DO NOT TAKE NSAIDS (EXAMPLES OF NSAIDS:  IBUPROFREN/ NAPROXEN)  °Diet:                    First 2 Weeks ° You will see the dietician t about two (2) weeks  after your surgery. The dietician will increase the types of foods you can eat if you are handling liquids well: °• If you have severe vomiting or nausea and cannot keep down clear liquids lasting longer than 1 day, call your surgeon @ ([phone]) °Protein Shake °• Drink at least 2 ounces of shake 5-6 times per day °• Each serving of protein shakes (usually 8 - 12 ounces) should have: °o 15 grams of protein  °o And no more than 5 grams of carbohydrate  °• Goal for protein each day: °o Men = 80 grams per day °o Women = 60 grams per day °• Protein powder may be added to fluids such as non-fat milk or Lactaid milk or unsweetened Soy/Almond milk (limit to 35 grams added protein powder per serving) ° °Hydration °• Slowly increase the amount of water and other clear liquids as tolerated (See Acceptable Fluids) °• Slowly increase the amount of protein shake as tolerated  °•  Sip fluids slowly and throughout the day.  Do not use straws. °• May use sugar substitutes in small amounts (no more than 6 - 8 packets per day; i.e. Splenda) ° °Fluid Goal °• The first goal is to drink at least 8 ounces of protein shake/drink per day (or as directed by the nutritionist); some examples of protein shakes are Syntrax Nectar, Adkins Advantage, EAS Edge HP, and Unjury. See handout from pre-op Bariatric Education Class: °o Slowly increase the amount of protein shake you drink as tolerated °o You may find it easier to slowly sip shakes throughout the day °o It is important to get your proteins in first °• Your fluid goal is to drink 64 - 100 ounces of fluid daily °o It may take a few weeks to build up to this °• 32 oz (or more) should be clear liquids  °And  °• 32 oz (or more) should be full liquids (see below for examples) °• Liquids should not contain sugar, caffeine, or carbonation ° °Clear Liquids: °• Water or Sugar-free flavored water (i.e. Fruit H2O, Propel) °• Decaffeinated coffee or tea (sugar-free) °• Crystal Lite, Wyler’s Lite,  Minute Maid Lite °• Sugar-free Jell-O °• Bouillon or broth °• Sugar-free Popsicle:   *Less than 20 calories each; Limit 1 per day ° °Full Liquids: °Protein Shakes/Drinks + 2 choices per day of other full liquids °• Full liquids must be: °o No More Than 15 grams of Carbs per serving  °o No More Than 3 grams of Fat per serving °• Strained low-fat cream soup (except Cream of Potato or Tomato) °• Non-Fat milk °• Fat-free Lactaid Milk °• Unsweetened Soy Or Unsweetened Almond Milk °• Low Sugar yogurt (Dannon Lite & Fit, Greek yogurt; Oikos Triple Zero; Chobani Simply 100; Yoplait 100 calorie Greek - No Fruit on the Bottom) ° °  °Vitamins   and Minerals • Start 1 day after surgery unless otherwise directed by your surgeon °• 2 Chewable Bariatric Specific Multivitamin / Multimineral Supplement with iron (Example: Bariatric Advantage Multi EA) °• Chewable Calcium with Vitamin D-3 °(Example: 3 Chewable Calcium Plus 600 with Vitamin D-3) °o Take 500 mg three (3) times a day for a total of 1500 mg each day °o Do not take all 3 doses of calcium at one time as it may cause constipation, and you can only absorb 500 mg  at a time  °o Do not mix multivitamins containing iron with calcium supplements; take 2 hours apart °• Menstruating women and those with a history of anemia (a blood disease that causes weakness) may need extra iron °o Talk with your doctor to see if you need more iron °• Do not stop taking or change any vitamins or minerals until you talk to your dietitian or surgeon °• Your Dietitian and/or surgeon must approve all vitamin and mineral supplements °  °Activity and Exercise: Limit your physical activity as instructed by your doctor.  It is important to continue walking at home.  During this time, use these guidelines: °• Do not lift anything greater than ten (10) pounds for at least two (2) weeks °• Do not go back to work or drive until your surgeon says you can °• You may have sex when you feel comfortable  °o It is  VERY important for female patients to use a reliable birth control method; fertility often increases after surgery  °o All hormonal birth control will be ineffective for 30 days after surgery due to medications given during surgery a barrier method must be used. °o Do not get pregnant for at least 18 months °• Start exercising as soon as your doctor tells you that you can °o Make sure your doctor approves any physical activity °• Start with a simple walking program °• Walk 5-15 minutes each day, 7 days per week.  °• Slowly increase until you are walking 30-45 minutes per day °Consider joining our BELT program. [phone] or email belt@[employer] °  °Special Instructions Things to remember: °• Use your CPAP when sleeping if this applies to you ° °• [facility] has two free Bariatric Surgery Support Groups that meet monthly °o The 3rd Thursday of each month, 6 pm, [facility]  °o The 2nd Friday of each month, 11:45 am in the private dining room in the basement of [facility] °• It is very important to keep all follow up appointments with your surgeon, dietitian, primary care physician, and behavioral health practitioner °• Routine follow up schedule with your surgeon include appointments at 2-3 weeks, 6-8 weeks, 6 months, and 1 year at a minimum.  Your surgeon may request to see you more often.   °o After the first year, please follow up with your bariatric surgeon and dietitian at least once a year in order to maintain best weight loss results °[facility]: [phone] °[facility]: [phone] °Bariatric Nurse Coordinator: [phone] °  °   Reviewed and Endorsed  °by [place] Patient Education Committee, June, 2016 °Edits Approved: Aug, 2018 ° ° ° °

## 2019-04-05 NOTE — Transfer of Care (Signed)
Immediate Anesthesia Transfer of Care Note  Patient: Tammy Mooney  Procedure(s) Performed: LAPAROSCOPIC GASTRIC SLEEVE RESECTION, UPPER ENDO, ERAS Pathway (N/A Abdomen)  Patient Location: PACU  Anesthesia Type:General  Level of Consciousness: awake, alert , oriented and patient cooperative  Airway & Oxygen Therapy: Patient Spontanous Breathing and Patient connected to face mask oxygen  Post-op Assessment: Report given to RN, Post -op Vital signs reviewed and stable and Patient moving all extremities  Post vital signs: Reviewed and stable  Last Vitals:  Vitals Value Taken Time  BP 144/89 04/05/19 1130  Temp 36.8 C 04/05/19 1130  Pulse 103 04/05/19 1131  Resp 19 04/05/19 1131  SpO2 100 % 04/05/19 1131  Vitals shown include unvalidated device data.  Last Pain:  Vitals:   04/05/19 0831  TempSrc:   PainSc: 0-No pain         Complications: No apparent anesthesia complications

## 2019-04-05 NOTE — Progress Notes (Signed)
Discussed post op day goals with patient including ambulation, IS, diet progression, pain, and nausea control.  BSTOP education provided including BSTOP information guide, "Guide for Pain Management after your Bariatric Procedure".  Questions answered. 

## 2019-04-06 ENCOUNTER — Encounter (HOSPITAL_COMMUNITY): Payer: Self-pay | Admitting: General Surgery

## 2019-04-06 LAB — COMPREHENSIVE METABOLIC PANEL
ALT: 39 U/L (ref 0–44)
AST: 19 U/L (ref 15–41)
Albumin: 3.9 g/dL (ref 3.5–5.0)
Alkaline Phosphatase: 61 U/L (ref 38–126)
Anion gap: 7 (ref 5–15)
BUN: 10 mg/dL (ref 6–20)
CO2: 23 mmol/L (ref 22–32)
Calcium: 9.5 mg/dL (ref 8.9–10.3)
Chloride: 108 mmol/L (ref 98–111)
Creatinine, Ser: 0.61 mg/dL (ref 0.44–1.00)
GFR calc Af Amer: >60 mL/min (ref >60)
GFR calc non Af Amer: >60 mL/min (ref >60)
Glucose, Bld: 136 mg/dL — ABNORMAL HIGH (ref 70–99)
Potassium: 4.2 mmol/L (ref 3.5–5.1)
Sodium: 138 mmol/L (ref 135–145)
Total Bilirubin: 0.6 mg/dL (ref 0.3–1.2)
Total Protein: 6.8 g/dL (ref 6.5–8.1)

## 2019-04-06 LAB — CBC WITH DIFFERENTIAL/PLATELET
Abs Immature Granulocytes: 0.07 10*3/uL (ref 0.00–0.07)
Basophils Absolute: 0 10*3/uL (ref 0.0–0.1)
Basophils Relative: 0 %
Eosinophils Absolute: 0 10*3/uL (ref 0.0–0.5)
Eosinophils Relative: 0 %
HCT: 42.4 % (ref 36.0–46.0)
Hemoglobin: 13.5 g/dL (ref 12.0–15.0)
Immature Granulocytes: 1 %
Lymphocytes Relative: 8 %
Lymphs Abs: 1 10*3/uL (ref 0.7–4.0)
MCH: 28.7 pg (ref 26.0–34.0)
MCHC: 31.8 g/dL (ref 30.0–36.0)
MCV: 90.2 fL (ref 80.0–100.0)
Monocytes Absolute: 0.6 10*3/uL (ref 0.1–1.0)
Monocytes Relative: 5 %
Neutro Abs: 11 10*3/uL — ABNORMAL HIGH (ref 1.7–7.7)
Neutrophils Relative %: 86 %
Platelets: 363 10*3/uL (ref 150–400)
RBC: 4.7 MIL/uL (ref 3.87–5.11)
RDW: 13.4 % (ref 11.5–15.5)
WBC: 12.7 10*3/uL — ABNORMAL HIGH (ref 4.0–10.5)
nRBC: 0 % (ref 0.0–0.2)

## 2019-04-06 LAB — SURGICAL PATHOLOGY

## 2019-04-06 LAB — GLUCOSE, CAPILLARY
Glucose-Capillary: 129 mg/dL — ABNORMAL HIGH (ref 70–99)
Glucose-Capillary: 134 mg/dL — ABNORMAL HIGH (ref 70–99)
Glucose-Capillary: 152 mg/dL — ABNORMAL HIGH (ref 70–99)
Glucose-Capillary: 192 mg/dL — ABNORMAL HIGH (ref 70–99)

## 2019-04-06 MED ORDER — ONDANSETRON 4 MG PO TBDP: 4.0000 mg | ORAL_TABLET | Freq: Four times a day (QID) | ORAL | 0 refills | Status: DC | PRN

## 2019-04-06 MED ORDER — MENTHOL 3 MG MT LOZG
1.0000 | LOZENGE | OROMUCOSAL | Status: DC | PRN
  Filled 2019-04-06: qty 9

## 2019-04-06 MED ORDER — PANTOPRAZOLE SODIUM 40 MG PO TBEC: 40.0000 mg | DELAYED_RELEASE_TABLET | Freq: Every day | ORAL | 0 refills | Status: DC

## 2019-04-06 MED ORDER — GABAPENTIN 100 MG PO CAPS: 200.0000 mg | ORAL_CAPSULE | Freq: Two times a day (BID) | ORAL | 0 refills | Status: DC

## 2019-04-06 MED ORDER — ACETAMINOPHEN 500 MG PO TABS: 1000.0000 mg | ORAL_TABLET | Freq: Three times a day (TID) | ORAL | 0 refills | Status: AC

## 2019-04-06 MED ORDER — TRAMADOL HCL 50 MG PO TABS: 50.0000 mg | ORAL_TABLET | Freq: Four times a day (QID) | ORAL | 0 refills | Status: DC | PRN

## 2019-04-06 MED FILL — ONDANSETRON ODT 4 MG TABLET: 4 | 5 days supply | Qty: 20 | Fill #0

## 2019-04-06 MED FILL — PANTOPRAZOLE SOD DR 40 MG T: 40 | 90 days supply | Qty: 90 | Fill #0

## 2019-04-06 MED FILL — traMADol HCL 50 MG TABS: 50 | 2 days supply | Qty: 10 | Fill #0

## 2019-04-06 MED FILL — GABAPENTIN 100 MG CAPSULE: 100 | 5 days supply | Qty: 20 | Fill #0

## 2019-04-06 NOTE — Progress Notes (Signed)
Patient alert and oriented, pain is controlled. Patient is tolerating fluids, advanced to protein shake today, patient is tolerating well.  Reviewed Gastric sleeve discharge instructions with patient and patient is able to articulate understanding.  Provided information on BELT program, Support Group and [facility] outpatient pharmacy. All questions answered, will continue to monitor.  Total fluid intake 930 Per dehydration protocol call back one week post op

## 2019-04-06 NOTE — Discharge Summary (Signed)
Physician Discharge Summary  Tammy Mooney DGU:[id number] DOB: 1980-08-30 DOA: 04/05/2019  PCP: Briscoe Deutscher, DO  Admit date: 04/05/2019 Discharge date:  04/06/2019  Recommendations for Outpatient Follow-up:   Follow-up Information    Greer Pickerel, MD. Go on 04/29/2019.   Specialty: General Surgery Why: at 4 pm Contact information: [address] [place] [address] [phone]        Carlena Hurl, PA-C. Go on 06/03/2019.   Specialty: General Surgery Why: at 9 am Contact information: [address] [place] [address] [phone]          Discharge Diagnoses:  Principal Problem:   Class 3 severe obesity with serious comorbidity and body mass index (BMI) of 40.0 to 44.9 in adult [facility]) Active Problems:   PCOS (polycystic ovarian syndrome)   Type 2 diabetes mellitus without complication, without long-term current use of insulin ([place])   Dyslipidemia associated with type 2 diabetes mellitus ([name])   Chronic pain of right knee   S/P laparoscopic sleeve gastrectomy   Surgical Procedure: Laparoscopic Sleeve Gastrectomy, upper endoscopy  Discharge Condition: Good Disposition: Home  Diet recommendation: Postoperative sleeve gastrectomy diet (liquids only)  Filed Weights   04/05/19 0747 04/05/19 0808  Weight: 119.7 kg 119.7 kg     Hospital Course:  The patient was admitted for a planned laparoscopic sleeve gastrectomy. Please see operative note. Preoperatively the patient was given 5000 units of subcutaneous heparin for DVT prophylaxis. Postoperative prophylactic Lovenox dosing was started on the evening of postoperative day 0. ERAS protocol was used. On the evening of postoperative day 0, the patient was started on water and ice chips. On postoperative day 1 the patient had no fever or tachycardia and was tolerating water in their diet was gradually advanced throughout the day. The patient was ambulating without difficulty. Their vital signs  are stable without fever or tachycardia. Their hemoglobin had remained stable. Biggest c/o on pod 1 was sore throat.  The patient had received discharge instructions and counseling. They were deemed stable for discharge and had met discharge criteria  BP 113/62 (BP Location: Left Arm)   Pulse 73   Temp 98.2 F (36.8 C) (Oral)   Resp 18   Ht '5\' 6"'  (1.676 m)   Wt 119.7 kg   SpO2 98%   BMI 42.61 kg/m   Gen: alert, NAD, non-toxic appearing Pupils: equal, no scleral icterus Pulm: Lungs clear to auscultation, symmetric chest rise CV: regular rate and rhythm Abd: soft, mild expected ttp;  nondistended.  No cellulitis. No incisional hernia Ext: no edema, no calf tenderness Skin: no rash, no jaundice    Discharge Instructions  Discharge Instructions    Ambulate hourly while awake   Complete by: As directed    Call MD for:  difficulty breathing, headache or visual disturbances   Complete by: As directed    Call MD for:  persistant dizziness or light-headedness   Complete by: As directed    Call MD for:  persistant nausea and vomiting   Complete by: As directed    Call MD for:  redness, tenderness, or signs of infection (pain, swelling, redness, odor or green/yellow discharge around incision site)   Complete by: As directed    Call MD for:  severe uncontrolled pain   Complete by: As directed    Call MD for:  temperature >101 F   Complete by: As directed    Diet bariatric full liquid   Complete by: As directed  Discharge instructions   Complete by: As directed    See bariatric discharge instructions   Incentive spirometry   Complete by: As directed    Perform hourly while awake     Allergies as of 04/06/2019      Reactions   Adhesive [tape] Rash, Other (See Comments)   Plastic tape causes rash, paper tape is okay, tegaderm is ok   Penicillins Rash   Did it involve swelling of the face/tongue/throat, SOB, or low BP? N Did it involve sudden or severe rash/hives, skin  peeling, or any reaction on the inside of your mouth or nose? Y Did you need to seek medical attention at a hospital or doctor's office? N When did it last happen?childhood If all above answers are "NO", may proceed with cephalosporin use.      Medication List    STOP taking these medications   Dulaglutide 1.5 MG/0.5ML Sopn Commonly known as: Trulicity   metFORMIN 837 MG tablet Commonly known as: GLUCOPHAGE     TAKE these medications   acetaminophen 500 MG tablet Commonly known as: TYLENOL Take 2 tablets (1,000 mg total) by mouth every 8 (eight) hours for 5 days.   cetirizine 10 MG tablet Commonly known as: ZYRTEC Take 10 mg by mouth daily as needed for allergies.   escitalopram 20 MG tablet Commonly known as: LEXAPRO Take 1 tablet (20 mg total) by mouth daily.   gabapentin 100 MG capsule Commonly known as: NEURONTIN Take 2 capsules (200 mg total) by mouth every 12 (twelve) hours.   levonorgestrel 20 MCG/24HR IUD Commonly known as: MIRENA 1 each by Intrauterine route once.   levothyroxine 112 MCG tablet Commonly known as: SYNTHROID Take one daily What changed:   how much to take  how to take this  when to take this  additional instructions   meclizine 25 MG tablet Commonly known as: ANTIVERT Take 1 tablet (25 mg total) by mouth 3 (three) times daily as needed for dizziness.   MULTIVITAMIN PO Take 1 tablet by mouth daily.   ondansetron 4 MG disintegrating tablet Commonly known as: ZOFRAN-ODT Take 1 tablet (4 mg total) by mouth every 6 (six) hours as needed for nausea or vomiting.   pantoprazole 40 MG tablet Commonly known as: PROTONIX Take 1 tablet (40 mg total) by mouth daily.   traMADol 50 MG tablet Commonly known as: ULTRAM Take 1 tablet (50 mg total) by mouth every 6 (six) hours as needed (pain).   traZODone 50 MG tablet Commonly known as: DESYREL Take 1 tablet (50 mg total) by mouth at bedtime.   Vitamin D3 125 MCG (5000 UT) Caps Take  5,000 Units by mouth daily.      Follow-up Information    Greer Pickerel, MD. Go on 04/29/2019.   Specialty: General Surgery Why: at 4 pm Contact information: [address] [place] [address] [phone]        Carlena Hurl, PA-C. Go on 06/03/2019.   Specialty: General Surgery Why: at 9 am Contact information: [place] [address] [phone]            The results of significant diagnostics from this hospitalization (including imaging, microbiology, ancillary and laboratory) are listed below for reference.    Significant Diagnostic Studies: No results found.  Labs: Basic Metabolic Panel: Recent Labs  Lab 03/30/19 1358 04/06/19 0302  NA 136 138  K 3.5 4.2  CL 103 108  CO2 21* 23  GLUCOSE  92 136*  BUN 14 10  CREATININE 0.68 0.61  CALCIUM 9.7 9.5   Liver Function Tests: Recent Labs  Lab 03/30/19 1358 04/06/19 0302  AST 24 19  ALT 38 39  ALKPHOS 67 61  BILITOT 0.5 0.6  PROT 7.8 6.8  ALBUMIN 4.4 3.9    CBC: Recent Labs  Lab 03/30/19 1358 04/05/19 1152 04/06/19 0302  WBC 10.7*  --  12.7*  NEUTROABS 7.0  --  11.0*  HGB 14.7 13.4 13.5  HCT 44.4 41.7 42.4  MCV 87.1  --  90.2  PLT 413*  --  363    CBG: Recent Labs  Lab 04/05/19 1635 04/05/19 1950 04/06/19 0000 04/06/19 0439 04/06/19 0724  GLUCAP 176* 192* 152* 134* 129*    Principal Problem:   Class 3 severe obesity with serious comorbidity and body mass index (BMI) of 40.0 to 44.9 in adult [facility]) Active Problems:   PCOS (polycystic ovarian syndrome)   Type 2 diabetes mellitus without complication, without long-term current use of insulin ([place])   Dyslipidemia associated with type 2 diabetes mellitus ([name])   Chronic pain of right knee   S/P laparoscopic sleeve gastrectomy   Time coordinating discharge: 15 min  Signed:  Gayland Curry, MD [facility], [place] [phone] 04/06/2019, 8:32 AM

## 2019-04-06 NOTE — Progress Notes (Signed)
Nutrition Note  RD consulted for diet education for patient that is s/p bariatric surgery. While RDs are working remotely, Insurance risk surveyor providing education.  If nutrition issues arise, please consult RD.   Clayton Bibles, MS, RD, LDN Inpatient Clinical Dietitian Pager: [phone] After Hours Pager: [phone]

## 2019-04-06 NOTE — Plan of Care (Signed)
Pt was discharged home today. Instructions were reviewed with patient, and questions were answered. Pt was taken to main entrance via wheelchair by NT.  

## 2019-04-06 NOTE — Progress Notes (Signed)
Patient alert and oriented, Post op day 1.  Provided support and encouragement.  Encouraged pulmonary toilet, ambulation and small sips of liquids.  All questions answered.  Will continue to monitor. 

## 2019-04-08 ENCOUNTER — Other Ambulatory Visit: Payer: Self-pay | Admitting: *Deleted

## 2019-04-08 ENCOUNTER — Encounter: Payer: Self-pay | Admitting: *Deleted

## 2019-04-08 NOTE — Patient Outreach (Signed)
Tammy Mooney) Care Management  04/08/2019  Tammy Mooney May 09, 1981 [id number]   Transition of care call/case closure   Referral received: 03/30/19 Initial outreach: 04/08/19 Insurance: [employer] Health Save Plan   Subjective: Initial successful telephone call to patient's preferred number in order to complete transition of care assessment; 2 HIPAA identifiers verified. Explained purpose of call and completed transition of care assessment.  Tammy Mooney states she is doing well, denies post-operative problems, says surgical incisions are unremarkable, states surgical pain well managed with prescribed medications, tolerating diet, denies bowel or bladder problems. Spouse/children are assisting with her recovery.  She denies any ongoing health issues and says she does not need a referral to one of the [place] chronic disease management programs.  She says she does have the hospital indemnity insurance and would like the contact information for [employer]. She says she uses a [facility] outpatient pharmacy.  She denies educational needs related to staying safe during the COVID 19 pandemic. She says she was diagnosed with Covid earlier this year and her presenting symptoms were sinus headache and nasal burning with loss of taste and smell.  She says she will work from home beginning next week.   Objective:  Tammy Mooney was hospitalized at [facility] from 9/15-9/16/2020 for laparoscopic gastric sleeve surgery to treat morbid obesity. Comorbidities include: allergic rhinitis, Type 2 DM controlled with diet, most recent Hgb A1C= 6.3% on 03/02/19, hyperlipidemia, hypothyroidism, polycystic ovarian syndrome, anxiety, hx of coronavirus infection, vertigo, Vitamin D deficiency She was discharged to home on 04/06/19 without the need for home health services or DME.   Assessment:  Patient voices good understanding of all discharge instructions.  See transition of care flowsheet for  assessment details.   Plan:  Reviewed hospital discharge diagnosis of laparoscopic gastric sleeve surgery and treatment plan using hospital discharge instructions, assessing medication adherence, reviewing problems requiring provider notification, and discussing the importance of follow up with surgeon as directed. At [facility] request, emailed the [employer] contact number to her [facility] e-mail address. No ongoing care management needs identified so will close case to [place] Management services and route successful outreach letter with [place] Management pamphlet and 24 Hour Nurse Line Magnet to [place] Management clinical pool to be mailed to patient's home address.   Barrington Ellison RN,CCM,CDE [place] Management Coordinator Office Phone [phone] Office Fax [phone]

## 2019-04-11 ENCOUNTER — Telehealth (HOSPITAL_COMMUNITY): Payer: Self-pay

## 2019-04-11 NOTE — Telephone Encounter (Signed)
Patient called to discuss post bariatric surgery follow up questions.  See below:   1.  Tell me about your pain and pain management?denies pain stopped taking tylenol yesterday one gabapentin left taking tonight  2.  Let's talk about fluid intake.  How much total fluid are you taking in?60+ fluid  3.  How much protein have you taken in the last 2 days?80 grams  4.  Have you had nausea?  Tell me about when have experienced nausea and what you did to help?one zofran since discharge worked  5.  Has the frequency or color changed with your urine?urinating frequently light in color  6.  Tell me what your incisions look like?no problems steri's still in place showering daily  7.  Have you been passing gas? BM?had bm no problem  8.  If a problem or question were to arise who would you call?  Do you know contact numbers for [facility], [facility], and [facility]?aware of how to contact services  9.  How has the walking going?walking regularly  10.  How are your vitamins and calcium going?  How are you taking them?taking mvi daily and calcium, forgets last calcium usually discussed setting timers

## 2019-04-19 ENCOUNTER — Other Ambulatory Visit: Payer: Self-pay

## 2019-04-19 ENCOUNTER — Encounter: Payer: 59 | Attending: General Surgery | Admitting: Dietician

## 2019-04-19 ENCOUNTER — Encounter: Payer: Self-pay | Admitting: Dietician

## 2019-04-19 DIAGNOSIS — E669 Obesity, unspecified: Secondary | ICD-10-CM | POA: Diagnosis not present

## 2019-04-19 NOTE — Progress Notes (Signed)
Bariatric Class  Start Time: 3:45pm  End Time: 4:20pm  2 Week Post-Operative Nutrition Class  Patient was seen on 04/19/2019 for post-operative nutrition education at [facility]   Surgery date: 04/05/2019 Surgery type: Sleeve Start weight at [facility]: 260.6 (date: 07/26/2018) Weight today: 251.8 lbs Weight change: -8.8 lbs  Body Composition Scale 04/19/2019  Weight (lbs) 251.8  BMI 40.6  Total Body Fat % 43.8     Visceral Fat 13  Fat-Free Mass % 56.1     Total Body Water % 42.5     Muscle-Mass (lbs) 33.9  Body Fat Displacement ---         Torso  (lbs) 68.4         Left Leg  (lbs) 13.6         Right Leg  (lbs) 13.6         Left Arm  (lbs) 6.8         Right Arm  (lbs) 6.8    The following the learning objectives were met by the patient during this course:  Identifies Phase 3 (Soft, High Protein Foods) Dietary Goals and will begin from 2 weeks post-operatively to 2 months post-operatively  Identifies appropriate sources of fluids and proteins   States protein recommendations and appropriate sources post-operatively  Identifies the need for appropriate texture modifications, mastication, and bite sizes when consuming solids  Identifies appropriate multivitamin and calcium sources post-operatively  Describes the need for physical activity post-operatively and will follow MD recommendations  States when to call healthcare provider regarding medication questions or post-operative complications  Handouts given during class include:  Phase 3: Soft, High Protein Diet  Phase 3 Meal Ideas  Follow-Up Plan: Patient will follow-up at [facility] in 6 weeks for 2 month post-op nutrition visit for diet advancement per MD.

## 2019-04-20 ENCOUNTER — Other Ambulatory Visit: Payer: Self-pay | Admitting: Family Medicine

## 2019-04-20 DIAGNOSIS — E039 Hypothyroidism, unspecified: Secondary | ICD-10-CM

## 2019-04-20 DIAGNOSIS — F419 Anxiety disorder, unspecified: Secondary | ICD-10-CM

## 2019-04-22 MED FILL — LEVOTHYROXINE 112 MCG TAB: 112 | 90 days supply | Qty: 90 | Fill #0

## 2019-04-22 MED FILL — ESCITALOPRAM 20 MG TABLET: 20 | 90 days supply | Qty: 90 | Fill #0

## 2019-04-22 NOTE — Telephone Encounter (Signed)
Last fill for both medications 10/15/18  #90/1 Last OV 03/02/19

## 2019-04-25 ENCOUNTER — Telehealth: Payer: Self-pay | Admitting: Dietician

## 2019-04-25 NOTE — Telephone Encounter (Signed)
I spoke with patient via telephone to assess fluid intake and food tolerance since diet advancement to solid protein foods on 04/19/2019.  Surgery Date: 04/05/2019 Surgery Type: Sleeve  Daily fluid intake: 64 ounces Daily protein intake: 60 grams  Patient states that there are no issues. Able to tolerate cottage cheese, eggs, cheese just fine, 1/4 to 1/2 cup every couple hours. Drinks at least 1 protein shake per day.     Nat Christen Boyd) Short, MS, RD, LDN

## 2019-05-17 MED FILL — traZODone HCL 50 MG TABS: 50 | 90 days supply | Qty: 90 | Fill #2

## 2019-05-27 MED FILL — traZODone HCL 50 MG TABS: 50 | 90 days supply | Qty: 90 | Fill #2

## 2019-05-31 ENCOUNTER — Other Ambulatory Visit: Payer: Self-pay

## 2019-05-31 ENCOUNTER — Encounter: Payer: 59 | Attending: General Surgery | Admitting: Skilled Nursing Facility1

## 2019-05-31 DIAGNOSIS — Z6841 Body Mass Index (BMI) 40.0 and over, adult: Secondary | ICD-10-CM

## 2019-05-31 DIAGNOSIS — E669 Obesity, unspecified: Secondary | ICD-10-CM | POA: Diagnosis not present

## 2019-05-31 NOTE — Progress Notes (Signed)
Bariatric Nutrition Follow-Up Visit Medical Nutrition Therapy  Appt Start Time: 1:59  End Time: 2:35  2 Months Post-Operative sleeve Surgery Surgery Date: 04/05/2019  NUTRITION ASSESSMENT   Surgery date: 04/05/2019 Surgery type: Sleeve Start weight at [facility]: 260.6 (date: 07/26/2018) Weight today: 240.6 lbs   Weight change: 11.2 lbs  Body Composition Scale 04/19/2019 05/31/2019  Weight (lbs) 251.8 240.6  BMI 40.6 38.7  Total Body Fat % 43.8 42.6     Visceral Fat 13 12  Fat-Free Mass % 56.1 57.3     Total Body Water % 42.5 43.1     Muscle-Mass (lbs) 33.9 33.8  Body Fat Displacement ---          Torso  (lbs) 68.4 63.5         Left Leg  (lbs) 13.6 12.7         Right Leg  (lbs) 13.6 12.7         Left Arm  (lbs) 6.8 6.3         Right Arm  (lbs) 6.8 6.3   Clinical  Medical hx: PCOS, DM, hypothyroidism  Medications: levothyroxine Labs:    Lifestyle & Dietary Hx (including living situation, sleep regimen, functional ability, weight hx, current dietary patterns, fluid intake, supplements, physical activity, etc)  Pt initially stated that she was unaware of what she was allowed to eat/not eat at this point in her recovery. She has already incorporated all foods into her diet. Dietitian educated Pt on importance of balanced meals due to Pt unwillingness to change current diet to conform to phase 4 (2 mo. post-op) diet. Surgeon stated that she could not have capsule MV until 6 months post op. Dietitian advised pt that if all foods were well tolerated, a capsule MV can be tolerated. Dietitian advised Pt to  Pt reports she can't remember to take 3 calcium a day. Dietitian advised Pt to take at least 1 supplement a day, and attempt to get the rest from diet. Pt states bread made her sick (continues to eat it).  Pt states she uses baritastic app to log (inconsistant).  Pt is eating outside of the current recommended phases of the diet progression. This was explained to be ill advised to the pt  but pt rebutted with "I am losing 2 pounds a week" Pt states she will snack all day throughout the day including crackers and candy.   Pt states she thinks seeing her kids eat is a trigger for her to snack and eat.  Pt states when she worked with [name] she uncovered she is a stress eater. Pt states being home when she is done setting up a Rental house in [place] will cause her to meal plan which will help.  Pt reports eating over 1200 calories regularly. Pt states that she believes she is consuming enough water daily: Dietitian educated patient on:  Purpose of hydration: Water makes up over 50% of your total body water, and is part of many organs throughout the body. Water is essential to transport digested nutrients, regulate body temperature, rid the body of waste products, and protects joints and the spinal cord. When not properly hydrated you will begin to experience headaches, cramps and dizziness. Further dehydration can result in rapid heart rate, shock, oliguria, and may cause seizures.  ? https://www.merckmanuals.com/home/hormonal-and-metabolic-disordehttps://www.usgs.gov/special-topic/water-science-school/science/water-you-water-and-human-body?qt-science_center_objects=0#qt-science_center_objectsrs/water-balance/about-body-water ? HistoricalGrowth.gl ? https://www.stevens.org/ ? PimpTShirt.fi ? https://www.health.InvestmentBrowse.at  Pt was not open to dietitian education with obstinate Language verbal/body.  Pt seems to see weight loss as her only measure  of success since she has been losing the amount of weight she has wanted struggles to appreciate how her current diet will change her weight maintainance abilities in the future    Estimated daily fluid intake: 33.8-50.7 oz not including 1 protein  shakes  Estimated daily protein intake: 70+ g Supplements: Chewy multivitamin  Current average weekly physical activity: ADL's  24-Hr Dietary Recall First Meal 8-9 premier protein shake Snack: 10:30 cheesestick Second Meal: 12: bread and burger patty Snack: crackers Third Meal: grilled chicken  Snack: protein shake  Beverages: 2-3 16.9 bottles  Post-Op Goals/ Signs/ Symptoms Using straws: no Drinking while eating: yes Chewing/swallowing difficulties: no Changes in vision: no Changes to mood/headaches: no Hair loss/changes to skin/nails: no Difficulty focusing/concentrating: no Sweating: no Dizziness/lightheadedness: no Palpitations: no  Carbonated/caffeinated beverages: no N/V/D/C/Gas: no Abdominal pain: no Dumping syndrome: no    NUTRITION DIAGNOSIS  Overweight/obesity ([place]-3.3) related to past poor dietary habits and physical inactivity as evidenced by completed bariatric surgery and following dietary guidelines for continued weight loss and healthy nutrition status.     NUTRITION INTERVENTION Nutrition counseling (C-1) and education (E-2) to facilitate bariatric surgery goals, including:  . The importance of consuming adequate calories as well as certain nutrients daily due to the body's need for essential vitamins, minerals, and fats . The importance of daily physical activity and to reach a goal of at least 150 minutes of moderate to vigorous physical activity weekly (or as directed by their physician) due to benefits such as increased musculature and improved lab values  Handouts Provided Include   bariatric myplate  Meal ideas sheet  Learning Style & Readiness for Change Teaching method utilized: Visual & Auditory  Demonstrated degree of understanding via: Teach Back  Barriers to learning/adherence to lifestyle change: doubt in self efficacy   RD's Notes for Next Visit . Pt seems to see weight loss as her only measure of success; struggles to appreciate how  her current diet will change her weight maintainance abilities in the future

## 2019-07-15 MED FILL — ESCITALOPRAM 20 MG TABLET: 20 | 90 days supply | Qty: 90 | Fill #1

## 2019-07-15 MED FILL — LEVOTHYROXINE 112 MCG TAB: 112 | 90 days supply | Qty: 90 | Fill #1

## 2019-07-26 ENCOUNTER — Other Ambulatory Visit: Payer: Self-pay

## 2019-07-26 MED FILL — ESCITALOPRAM 20 MG TABLET: 20 | 90 days supply | Qty: 90 | Fill #1

## 2019-07-26 MED FILL — LEVOTHYROXINE 112 MCG TAB: 112 | 90 days supply | Qty: 90 | Fill #1

## 2019-07-27 ENCOUNTER — Encounter: Payer: Self-pay | Admitting: Family Medicine

## 2019-07-27 ENCOUNTER — Ambulatory Visit (INDEPENDENT_AMBULATORY_CARE_PROVIDER_SITE_OTHER): Payer: 59 | Admitting: Family Medicine

## 2019-07-27 VITALS — BP 90/64 | HR 63 | Temp 96.6°F | Ht 66.0 in | Wt 234.0 lb

## 2019-07-27 DIAGNOSIS — E119 Type 2 diabetes mellitus without complications: Secondary | ICD-10-CM

## 2019-07-27 DIAGNOSIS — E039 Hypothyroidism, unspecified: Secondary | ICD-10-CM

## 2019-07-27 DIAGNOSIS — F419 Anxiety disorder, unspecified: Secondary | ICD-10-CM | POA: Diagnosis not present

## 2019-07-27 DIAGNOSIS — Z9884 Bariatric surgery status: Secondary | ICD-10-CM

## 2019-07-27 DIAGNOSIS — E785 Hyperlipidemia, unspecified: Secondary | ICD-10-CM

## 2019-07-27 DIAGNOSIS — E1169 Type 2 diabetes mellitus with other specified complication: Secondary | ICD-10-CM

## 2019-07-27 DIAGNOSIS — Z0000 Encounter for general adult medical examination without abnormal findings: Secondary | ICD-10-CM | POA: Diagnosis not present

## 2019-07-27 LAB — MICROALBUMIN / CREATININE URINE RATIO
Creatinine,U: 195.2 mg/dL
Microalb Creat Ratio: 2.2 mg/g (ref 0.0–30.0)
Microalb, Ur: 4.4 mg/dL — ABNORMAL HIGH (ref 0.0–1.9)

## 2019-07-27 LAB — CBC WITH DIFFERENTIAL/PLATELET
Basophils Absolute: 0.1 10*3/uL (ref 0.0–0.1)
Basophils Relative: 1.2 % (ref 0.0–3.0)
Eosinophils Absolute: 0.1 10*3/uL (ref 0.0–0.7)
Eosinophils Relative: 1 % (ref 0.0–5.0)
HCT: 41.8 % (ref 36.0–46.0)
Hemoglobin: 14.3 g/dL (ref 12.0–15.0)
Lymphocytes Relative: 27.5 % (ref 12.0–46.0)
Lymphs Abs: 2.1 10*3/uL (ref 0.7–4.0)
MCHC: 34.1 g/dL (ref 30.0–36.0)
MCV: 85.3 fl (ref 78.0–100.0)
Monocytes Absolute: 0.4 10*3/uL (ref 0.1–1.0)
Monocytes Relative: 5.2 % (ref 3.0–12.0)
Neutro Abs: 4.9 10*3/uL (ref 1.4–7.7)
Neutrophils Relative %: 65.1 % (ref 43.0–77.0)
Platelets: 399 10*3/uL (ref 150.0–400.0)
RBC: 4.9 Mil/uL (ref 3.87–5.11)
RDW: 14.2 % (ref 11.5–15.5)
WBC: 7.6 10*3/uL (ref 4.0–10.5)

## 2019-07-27 LAB — COMPREHENSIVE METABOLIC PANEL
ALT: 20 U/L (ref 0–35)
AST: 14 U/L (ref 0–37)
Albumin: 4.3 g/dL (ref 3.5–5.2)
Alkaline Phosphatase: 82 U/L (ref 39–117)
BUN: 16 mg/dL (ref 6–23)
CO2: 23 mEq/L (ref 19–32)
Calcium: 9.8 mg/dL (ref 8.4–10.5)
Chloride: 106 mEq/L (ref 96–112)
Creatinine, Ser: 0.68 mg/dL (ref 0.40–1.20)
GFR: 95.94 mL/min (ref >60.00)
Glucose, Bld: 91 mg/dL (ref 70–99)
Potassium: 4.1 mEq/L (ref 3.5–5.1)
Sodium: 138 mEq/L (ref 135–145)
Total Bilirubin: 0.6 mg/dL (ref 0.2–1.2)
Total Protein: 6.9 g/dL (ref 6.0–8.3)

## 2019-07-27 LAB — HEMOGLOBIN A1C: Hgb A1c MFr Bld: 5.6 % (ref 4.6–6.5)

## 2019-07-27 LAB — VITAMIN B12: Vitamin B-12: 377 pg/mL (ref 211–911)

## 2019-07-27 LAB — VITAMIN D 25 HYDROXY (VIT D DEFICIENCY, FRACTURES): VITD: 24.51 ng/mL — ABNORMAL LOW (ref 30.00–100.00)

## 2019-07-27 LAB — IRON: Iron: 91 ug/dL (ref 42–145)

## 2019-07-27 LAB — TSH: TSH: 1.01 u[IU]/mL (ref 0.35–4.50)

## 2019-07-27 LAB — T4, FREE: Free T4: 1.15 ng/dL (ref 0.60–1.60)

## 2019-07-27 NOTE — Patient Instructions (Signed)
Routine lab work today. You look great! So nice to meet you!  Dr. Rogers Blocker   Preventive Care 40-40 Years Old, Female Preventive care refers to visits with your health care provider and lifestyle choices that can promote health and wellness. This includes:  A yearly physical exam. This may also be called an annual well check.  Regular dental visits and eye exams.  Immunizations.  Screening for certain conditions.  Healthy lifestyle choices, such as eating a healthy diet, getting regular exercise, not using drugs or products that contain nicotine and tobacco, and limiting alcohol use. What can I expect for my preventive care visit? Physical exam Your health care provider will check your:  Height and weight. This may be used to calculate body mass index (BMI), which tells if you are at a healthy weight.  Heart rate and blood pressure.  Skin for abnormal spots. Counseling Your health care provider may ask you questions about your:  Alcohol, tobacco, and drug use.  Emotional well-being.  Home and relationship well-being.  Sexual activity.  Eating habits.  Work and work Statistician.  Method of birth control.  Menstrual cycle.  Pregnancy history. What immunizations do I need?  Influenza (flu) vaccine  This is recommended every year. Tetanus, diphtheria, and pertussis (Tdap) vaccine  You may need a Td booster every 10 years. Varicella (chickenpox) vaccine  You may need this if you have not been vaccinated. Human papillomavirus (HPV) vaccine  If recommended by your health care provider, you may need three doses over 6 months. Measles, mumps, and rubella (MMR) vaccine  You may need at least one dose of MMR. You may also need a second dose. Meningococcal conjugate (MenACWY) vaccine  One dose is recommended if you are age 62-21 years and a first-year college student living in a residence hall, or if you have one of several medical conditions. You may also need  additional booster doses. Pneumococcal conjugate (PCV13) vaccine  You may need this if you have certain conditions and were not previously vaccinated. Pneumococcal polysaccharide (PPSV23) vaccine  You may need one or two doses if you smoke cigarettes or if you have certain conditions. Hepatitis A vaccine  You may need this if you have certain conditions or if you travel or work in places where you may be exposed to hepatitis A. Hepatitis B vaccine  You may need this if you have certain conditions or if you travel or work in places where you may be exposed to hepatitis B. Haemophilus influenzae type b (Hib) vaccine  You may need this if you have certain conditions. You may receive vaccines as individual doses or as more than one vaccine together in one shot (combination vaccines). Talk with your health care provider about the risks and benefits of combination vaccines. What tests do I need?  Blood tests  Lipid and cholesterol levels. These may be checked every 5 years starting at age 59.  Hepatitis C test.  Hepatitis B test. Screening  Diabetes screening. This is done by checking your blood sugar (glucose) after you have not eaten for a while (fasting).  Sexually transmitted disease (STD) testing.  BRCA-related cancer screening. This may be done if you have a family history of breast, ovarian, tubal, or peritoneal cancers.  Pelvic exam and Pap test. This may be done every 3 years starting at age 68. Starting at age 66, this may be done every 5 years if you have a Pap test in combination with an HPV test. Talk with your  health care provider about your test results, treatment options, and if necessary, the need for more tests. Follow these instructions at home: Eating and drinking   Eat a diet that includes fresh fruits and vegetables, whole grains, lean protein, and low-fat dairy.  Take vitamin and mineral supplements as recommended by your health care provider.  Do not  drink alcohol if: ? Your health care provider tells you not to drink. ? You are pregnant, may be pregnant, or are planning to become pregnant.  If you drink alcohol: ? Limit how much you have to 0-1 drink a day. ? Be aware of how much alcohol is in your drink. In the [place] one drink equals one 12 oz bottle of beer (355 mL), one 5 oz glass of wine (148 mL), or one 1 oz glass of hard liquor (44 mL). Lifestyle  Take daily care of your teeth and gums.  Stay active. Exercise for at least 30 minutes on 5 or more days each week.  Do not use any products that contain nicotine or tobacco, such as cigarettes, e-cigarettes, and chewing tobacco. If you need help quitting, ask your health care provider.  If you are sexually active, practice safe sex. Use a condom or other form of birth control (contraception) in order to prevent pregnancy and STIs (sexually transmitted infections). If you plan to become pregnant, see your health care provider for a preconception visit. What's next?  Visit your health care provider once a year for a well check visit.  Ask your health care provider how often you should have your eyes and teeth checked.  Stay up to date on all vaccines. This information is not intended to replace advice given to you by your health care provider. Make sure you discuss any questions you have with your health care provider. Document Revised: 03/18/2018 Document Reviewed: 03/18/2018 Elsevier Patient Education  2020 Elsevier Inc.  

## 2019-07-27 NOTE — Progress Notes (Signed)
Patient: Tammy Mooney MRN: [id number] DOB: September 03, 1980 PCP: Orma Flaming, MD     Subjective:  Chief Complaint  Patient presents with  . Transitions Of Care  . Annual Exam    HPI: The patient is a 39 y.o. female who presents today for annual exam. She denies any changes to past medical history. There have been no recent hospitalizations. They are following a well balanced diet and exercise plan. Weight has been decreasing steadily. She had gastric sleeve surgery this past fall and has lost 35 pounds.  She is fasting today.   No first degree relative with breast or colon cancer.   History of type 2 diabetes: diagnosed 2 years ago. a1c very well controlled. On no medication with recent sleeve and weight loss.   Anxiety: currently on lexapro 20mg . She states this seemed to have been triggered about 4 years ago with the birth of her 67rd child. She has been on this a little over a year and loves it. No desire to change medication.   Immunization History  Administered Date(s) Administered  . Influenza,inj,Quad PF,6+ Mos 04/12/2018  . Influenza-Unspecified 05/14/2017, 04/21/2019   Colonoscopy: 39 years of age.  Mammogram: this year, but thinking about 49 years.  Pap smear: 07/10/2017. Normal    Review of Systems  Constitutional: Negative for chills, fatigue and fever.  HENT: Negative for dental problem, ear pain, hearing loss and trouble swallowing.   Eyes: Negative for visual disturbance.  Respiratory: Negative for cough, chest tightness and shortness of breath.   Cardiovascular: Negative for chest pain, palpitations and leg swelling.  Gastrointestinal: Negative for abdominal pain, blood in stool, diarrhea, nausea and vomiting.  Endocrine: Negative for cold intolerance, polydipsia, polyphagia and polyuria.  Genitourinary: Negative for dysuria and hematuria.  Musculoskeletal: Negative for arthralgias.  Skin: Negative for rash.  Neurological: Negative for dizziness and  headaches.  Psychiatric/Behavioral: Positive for sleep disturbance. Negative for dysphoric mood. The patient is not nervous/anxious.     Allergies Patient is allergic to adhesive [tape] and penicillins.  Past Medical History Patient  has a past medical history of Anxiety, Coronavirus infection, DM (diabetes mellitus), type 2 ([place]), Hypothyroidism, Infertility, female, Low HDL (under 40), PCOS (polycystic ovarian syndrome), Varicella, and Vertigo.  Surgical History Patient  has a past surgical history that includes Cesarean section; Tonsillectomy; Eye surgery; Nasal sinus surgery; Cesarean section (07/01/2012); Cesarean section (N/A, 10/25/2014); Mandible surgery (AGE 48); Image guided sinus surgery (N/A, 06/05/2016); Turbinate reduction (Bilateral, 06/05/2016); Sinusotomy (Left, 06/05/2016); Maxillary antrostomy (Right, 06/05/2016); Hardware Removal (Left, 06/05/2016); and Laparoscopic gastric sleeve resection (N/A, 04/05/2019).  Family History Pateint's family history includes Depression in her mother; Diabetes in her brother, mother, and paternal grandmother; Heart attack in her father; Heart disease in her father and maternal grandfather; Hyperlipidemia in her father; Hypertension in her father; Obesity in her mother; Sleep apnea in her mother.  Social History Patient  reports that she has never smoked. She has never used smokeless tobacco. She reports that she does not drink alcohol or use drugs.    Objective: Vitals:   07/27/19 0830  BP: 90/64  Pulse: 63  Temp: (!) 96.6 F (35.9 C)  TempSrc: Temporal  SpO2: 97%  Weight: 234 lb (106.1 kg)  Height: 5\' 6"  (1.676 m)    Body mass index is 37.77 kg/m.  Physical Exam Vitals reviewed.  Constitutional:      Appearance: Normal appearance. She is well-developed. She is obese.  HENT:     Head: Normocephalic and atraumatic.  Right Ear: Tympanic membrane, ear canal and external ear normal.     Left Ear: Tympanic membrane, ear canal  and external ear normal.     Nose: Nose normal.     Mouth/Throat:     Mouth: Mucous membranes are moist.  Eyes:     Extraocular Movements: Extraocular movements intact.     Conjunctiva/sclera: Conjunctivae normal.     Pupils: Pupils are equal, round, and reactive to light.  Neck:     Thyroid: No thyromegaly.     Vascular: No carotid bruit.  Cardiovascular:     Rate and Rhythm: Normal rate and regular rhythm.     Heart sounds: Normal heart sounds. No murmur.  Pulmonary:     Effort: Pulmonary effort is normal.     Breath sounds: Normal breath sounds.  Abdominal:     General: Bowel sounds are normal. There is no distension.     Palpations: Abdomen is soft.     Tenderness: There is no abdominal tenderness.  Musculoskeletal:     Cervical back: Normal range of motion and neck supple.  Lymphadenopathy:     Cervical: No cervical adenopathy.  Skin:    General: Skin is warm and dry.     Capillary Refill: Capillary refill takes less than 2 seconds.     Findings: No rash.     Comments: Dermatofibroma on right lower anterior leg.   Neurological:     General: No focal deficit present.     Mental Status: She is alert and oriented to person, place, and time.     Cranial Nerves: No cranial nerve deficit.     Coordination: Coordination normal.     Deep Tendon Reflexes: Reflexes normal.  Psychiatric:        Mood and Affect: Mood normal.        Behavior: Behavior normal.        Depression screen [facility] 2/9 07/27/2019 10/15/2018 07/19/2018 02/12/2018 02/09/2017  Decreased Interest 0 1 0 1 2  Down, Depressed, Hopeless 0 1 1 1 2   PHQ - 2 Score 0 2 1 2 4   Altered sleeping 1 0 2 2 1   Tired, decreased energy 0 3 0 1 3  Change in appetite 0 0 0 0 2  Feeling bad or failure about yourself  0 0 0 1 3  Trouble concentrating 0 0 0 1 0  Moving slowly or fidgety/restless 0 0 0 0 0  Suicidal thoughts 0 0 0 0 0  PHQ-9 Score 1 5 3 7 13   Difficult doing work/chores Not difficult at all Not difficult at all  Not difficult at all Somewhat difficult -   GAD 7 : Generalized Anxiety Score 07/27/2019 10/15/2018 07/19/2018 02/12/2018  Nervous, Anxious, on Edge 0 3 1 3   Control/stop worrying 0 3 0 1  Worry too much - different things 0 3 1 2   Trouble relaxing 0 3 1 2   Restless 0 0 0 0  Easily annoyed or irritable 0 3 1 2   Afraid - awful might happen 0 0 0 1  Total GAD 7 Score 0 15 4 11   Anxiety Difficulty Not difficult at all Somewhat difficult Not difficult at all Somewhat difficult     Assessment/plan: 1. Annual physical exam Routine lab work today. She is fasting. Doing well with lifestyle changes s/p transplant. Really encouraged exercise. utd on HM> discussed mammogram guidelines. F/u in one year or as needed.  Patient counseling [x]    Nutrition: Stressed importance of moderation in sodium/caffeine  intake, saturated fat and cholesterol, caloric balance, sufficient intake of fresh fruits, vegetables, fiber, calcium, iron, and 1 mg of folate supplement per day (for females capable of pregnancy).  [x]    Stressed the importance of regular exercise.   []    Substance Abuse: Discussed cessation/primary prevention of tobacco, alcohol, or other drug use; driving or other dangerous activities under the influence; availability of treatment for abuse.   [x]    Injury prevention: Discussed safety belts, safety helmets, smoke detector, smoking near bedding or upholstery.   [x]    Sexuality: Discussed sexually transmitted diseases, partner selection, use of condoms, avoidance of unintended pregnancy  and contraceptive alternatives.  [x]    Dental health: Discussed importance of regular tooth brushing, flossing, and dental visits.  [x]    Health maintenance and immunizations reviewed. Please refer to Health maintenance section.     2. Primary hypothyroidism  - TSH - T4, free  3. Type 2 diabetes mellitus without complication, without long-term current use of insulin ([place]) Routine lab work. Encouraged yearly eye  exams. She will go and have this done. a1c has been very well controlled with diet alone x 2 years. F/u in 6 months.  - CBC with Differential/Platelet - Comprehensive metabolic panel - Hemoglobin A1c - Microalbumin / creatinine urine ratio  4. Dyslipidemia associated with type 2 diabetes mellitus ([place])   5. Anxiety GAD7 score of zero. Very well controlled on current medication. Continue lexapro 20mg . No refills needed at this time. F/u in 6 months.   6. S/P laparoscopic sleeve gastrectomy Labs per surgeon request.  - VITAMIN D 25 Hydroxy (Vit-D Deficiency, Fractures) - Vitamin B12 - Iron - Vitamin B1    This visit occurred during the SARS-CoV-2 public health emergency.  Safety protocols were in place, including screening questions prior to the visit, additional usage of staff PPE, and extensive cleaning of exam room while observing appropriate contact time as indicated for disinfecting solutions.    Return in about 6 months (around 01/24/2020) for anxiety/diabetes f/u .   Orma Flaming, MD [place]  07/27/2019

## 2019-07-28 ENCOUNTER — Other Ambulatory Visit: Payer: Self-pay | Admitting: Family Medicine

## 2019-07-28 DIAGNOSIS — E559 Vitamin D deficiency, unspecified: Secondary | ICD-10-CM

## 2019-07-28 MED ORDER — VITAMIN D (ERGOCALCIFEROL) 1.25 MG (50000 UNIT) PO CAPS: ORAL_CAPSULE | ORAL | 0 refills | Status: DC

## 2019-07-28 MED FILL — VIT D2 1.25 MG (50,000 UNIT: 1.25 MG | 56 days supply | Qty: 8 | Fill #0

## 2019-07-28 NOTE — Progress Notes (Signed)
Vit d 

## 2019-07-31 LAB — VITAMIN B1: Vitamin B1 (Thiamine): 8 nmol/L (ref 8–30)

## 2019-08-19 ENCOUNTER — Other Ambulatory Visit: Payer: Self-pay | Admitting: Family Medicine

## 2019-08-19 DIAGNOSIS — G4709 Other insomnia: Secondary | ICD-10-CM

## 2019-09-09 MED FILL — VIT D2 1.25 MG (50,000 UNIT: 1.25 MG | 56 days supply | Qty: 8 | Fill #0

## 2019-10-04 DIAGNOSIS — M9901 Segmental and somatic dysfunction of cervical region: Secondary | ICD-10-CM | POA: Diagnosis not present

## 2019-10-04 DIAGNOSIS — M9902 Segmental and somatic dysfunction of thoracic region: Secondary | ICD-10-CM | POA: Diagnosis not present

## 2019-10-04 DIAGNOSIS — M542 Cervicalgia: Secondary | ICD-10-CM | POA: Diagnosis not present

## 2019-10-04 DIAGNOSIS — M5032 Other cervical disc degeneration, mid-cervical region, unspecified level: Secondary | ICD-10-CM | POA: Diagnosis not present

## 2019-10-07 DIAGNOSIS — M9901 Segmental and somatic dysfunction of cervical region: Secondary | ICD-10-CM | POA: Diagnosis not present

## 2019-10-07 DIAGNOSIS — M9902 Segmental and somatic dysfunction of thoracic region: Secondary | ICD-10-CM | POA: Diagnosis not present

## 2019-10-07 DIAGNOSIS — M5032 Other cervical disc degeneration, mid-cervical region, unspecified level: Secondary | ICD-10-CM | POA: Diagnosis not present

## 2019-10-07 DIAGNOSIS — M542 Cervicalgia: Secondary | ICD-10-CM | POA: Diagnosis not present

## 2019-10-10 DIAGNOSIS — M542 Cervicalgia: Secondary | ICD-10-CM | POA: Diagnosis not present

## 2019-10-10 DIAGNOSIS — M5032 Other cervical disc degeneration, mid-cervical region, unspecified level: Secondary | ICD-10-CM | POA: Diagnosis not present

## 2019-10-10 DIAGNOSIS — M9901 Segmental and somatic dysfunction of cervical region: Secondary | ICD-10-CM | POA: Diagnosis not present

## 2019-10-10 DIAGNOSIS — M9902 Segmental and somatic dysfunction of thoracic region: Secondary | ICD-10-CM | POA: Diagnosis not present

## 2019-10-14 DIAGNOSIS — M9902 Segmental and somatic dysfunction of thoracic region: Secondary | ICD-10-CM | POA: Diagnosis not present

## 2019-10-14 DIAGNOSIS — M5032 Other cervical disc degeneration, mid-cervical region, unspecified level: Secondary | ICD-10-CM | POA: Diagnosis not present

## 2019-10-14 DIAGNOSIS — M9901 Segmental and somatic dysfunction of cervical region: Secondary | ICD-10-CM | POA: Diagnosis not present

## 2019-10-14 DIAGNOSIS — M542 Cervicalgia: Secondary | ICD-10-CM | POA: Diagnosis not present

## 2019-10-17 DIAGNOSIS — M9901 Segmental and somatic dysfunction of cervical region: Secondary | ICD-10-CM | POA: Diagnosis not present

## 2019-10-17 DIAGNOSIS — M5032 Other cervical disc degeneration, mid-cervical region, unspecified level: Secondary | ICD-10-CM | POA: Diagnosis not present

## 2019-10-17 DIAGNOSIS — M9902 Segmental and somatic dysfunction of thoracic region: Secondary | ICD-10-CM | POA: Diagnosis not present

## 2019-10-17 DIAGNOSIS — M542 Cervicalgia: Secondary | ICD-10-CM | POA: Diagnosis not present

## 2019-10-18 ENCOUNTER — Other Ambulatory Visit: Payer: Self-pay | Admitting: Family Medicine

## 2019-10-18 DIAGNOSIS — F419 Anxiety disorder, unspecified: Secondary | ICD-10-CM

## 2019-10-18 DIAGNOSIS — E039 Hypothyroidism, unspecified: Secondary | ICD-10-CM

## 2019-10-19 DIAGNOSIS — M9901 Segmental and somatic dysfunction of cervical region: Secondary | ICD-10-CM | POA: Diagnosis not present

## 2019-10-19 DIAGNOSIS — M542 Cervicalgia: Secondary | ICD-10-CM | POA: Diagnosis not present

## 2019-10-19 DIAGNOSIS — M9902 Segmental and somatic dysfunction of thoracic region: Secondary | ICD-10-CM | POA: Diagnosis not present

## 2019-10-19 DIAGNOSIS — M5032 Other cervical disc degeneration, mid-cervical region, unspecified level: Secondary | ICD-10-CM | POA: Diagnosis not present

## 2019-10-25 ENCOUNTER — Other Ambulatory Visit: Payer: Self-pay

## 2019-10-25 ENCOUNTER — Telehealth: Payer: Self-pay

## 2019-10-25 DIAGNOSIS — F419 Anxiety disorder, unspecified: Secondary | ICD-10-CM

## 2019-10-25 DIAGNOSIS — E039 Hypothyroidism, unspecified: Secondary | ICD-10-CM

## 2019-10-25 MED ORDER — LEVOTHYROXINE SODIUM 112 MCG PO TABS: ORAL_TABLET | ORAL | 1 refills | Status: DC

## 2019-10-25 MED FILL — LEVOTHYROXINE 112 MCG TAB: 112 | 90 days supply | Qty: 90 | Fill #0

## 2019-10-25 NOTE — Telephone Encounter (Signed)
Pt requesting Escitalopram 20mg  #90 with 1 refill  LOV: 07/27/2019 Next Office Visit: 02/01/2020  Last refill: 04/22/19  Approve?

## 2019-10-26 MED ORDER — ESCITALOPRAM OXALATE 20 MG PO TABS: 20.0000 mg | ORAL_TABLET | Freq: Every day | ORAL | 1 refills | Status: DC

## 2019-10-26 MED FILL — ESCITALOPRAM 20 MG TABLET: 20 | 90 days supply | Qty: 90 | Fill #0

## 2019-10-26 NOTE — Addendum Note (Signed)
Addended by: Orma Flaming on: 10/26/2019 11:50 AM   Modules accepted: Orders

## 2019-10-28 DIAGNOSIS — M9901 Segmental and somatic dysfunction of cervical region: Secondary | ICD-10-CM | POA: Diagnosis not present

## 2019-10-28 DIAGNOSIS — M5032 Other cervical disc degeneration, mid-cervical region, unspecified level: Secondary | ICD-10-CM | POA: Diagnosis not present

## 2019-10-28 DIAGNOSIS — M9902 Segmental and somatic dysfunction of thoracic region: Secondary | ICD-10-CM | POA: Diagnosis not present

## 2019-10-28 DIAGNOSIS — M542 Cervicalgia: Secondary | ICD-10-CM | POA: Diagnosis not present

## 2019-10-31 DIAGNOSIS — M9902 Segmental and somatic dysfunction of thoracic region: Secondary | ICD-10-CM | POA: Diagnosis not present

## 2019-10-31 DIAGNOSIS — M542 Cervicalgia: Secondary | ICD-10-CM | POA: Diagnosis not present

## 2019-10-31 DIAGNOSIS — M9901 Segmental and somatic dysfunction of cervical region: Secondary | ICD-10-CM | POA: Diagnosis not present

## 2019-10-31 DIAGNOSIS — M5032 Other cervical disc degeneration, mid-cervical region, unspecified level: Secondary | ICD-10-CM | POA: Diagnosis not present

## 2019-11-04 DIAGNOSIS — M9902 Segmental and somatic dysfunction of thoracic region: Secondary | ICD-10-CM | POA: Diagnosis not present

## 2019-11-04 DIAGNOSIS — M9901 Segmental and somatic dysfunction of cervical region: Secondary | ICD-10-CM | POA: Diagnosis not present

## 2019-11-04 DIAGNOSIS — M5032 Other cervical disc degeneration, mid-cervical region, unspecified level: Secondary | ICD-10-CM | POA: Diagnosis not present

## 2019-11-04 DIAGNOSIS — M542 Cervicalgia: Secondary | ICD-10-CM | POA: Diagnosis not present

## 2019-11-07 DIAGNOSIS — M9902 Segmental and somatic dysfunction of thoracic region: Secondary | ICD-10-CM | POA: Diagnosis not present

## 2019-11-07 DIAGNOSIS — M9901 Segmental and somatic dysfunction of cervical region: Secondary | ICD-10-CM | POA: Diagnosis not present

## 2019-11-07 DIAGNOSIS — M5032 Other cervical disc degeneration, mid-cervical region, unspecified level: Secondary | ICD-10-CM | POA: Diagnosis not present

## 2019-11-07 DIAGNOSIS — M542 Cervicalgia: Secondary | ICD-10-CM | POA: Diagnosis not present

## 2019-11-11 DIAGNOSIS — M9901 Segmental and somatic dysfunction of cervical region: Secondary | ICD-10-CM | POA: Diagnosis not present

## 2019-11-11 DIAGNOSIS — M542 Cervicalgia: Secondary | ICD-10-CM | POA: Diagnosis not present

## 2019-11-11 DIAGNOSIS — M5032 Other cervical disc degeneration, mid-cervical region, unspecified level: Secondary | ICD-10-CM | POA: Diagnosis not present

## 2019-11-11 DIAGNOSIS — M9902 Segmental and somatic dysfunction of thoracic region: Secondary | ICD-10-CM | POA: Diagnosis not present

## 2019-11-14 DIAGNOSIS — M5032 Other cervical disc degeneration, mid-cervical region, unspecified level: Secondary | ICD-10-CM | POA: Diagnosis not present

## 2019-11-14 DIAGNOSIS — M542 Cervicalgia: Secondary | ICD-10-CM | POA: Diagnosis not present

## 2019-11-14 DIAGNOSIS — M9902 Segmental and somatic dysfunction of thoracic region: Secondary | ICD-10-CM | POA: Diagnosis not present

## 2019-11-14 DIAGNOSIS — M9901 Segmental and somatic dysfunction of cervical region: Secondary | ICD-10-CM | POA: Diagnosis not present

## 2019-11-18 DIAGNOSIS — M9902 Segmental and somatic dysfunction of thoracic region: Secondary | ICD-10-CM | POA: Diagnosis not present

## 2019-11-18 DIAGNOSIS — M9901 Segmental and somatic dysfunction of cervical region: Secondary | ICD-10-CM | POA: Diagnosis not present

## 2019-11-18 DIAGNOSIS — M5032 Other cervical disc degeneration, mid-cervical region, unspecified level: Secondary | ICD-10-CM | POA: Diagnosis not present

## 2019-11-18 DIAGNOSIS — M542 Cervicalgia: Secondary | ICD-10-CM | POA: Diagnosis not present

## 2019-11-21 DIAGNOSIS — M9901 Segmental and somatic dysfunction of cervical region: Secondary | ICD-10-CM | POA: Diagnosis not present

## 2019-11-21 DIAGNOSIS — M542 Cervicalgia: Secondary | ICD-10-CM | POA: Diagnosis not present

## 2019-11-21 DIAGNOSIS — M9902 Segmental and somatic dysfunction of thoracic region: Secondary | ICD-10-CM | POA: Diagnosis not present

## 2019-11-21 DIAGNOSIS — M5032 Other cervical disc degeneration, mid-cervical region, unspecified level: Secondary | ICD-10-CM | POA: Diagnosis not present

## 2019-11-25 DIAGNOSIS — M5032 Other cervical disc degeneration, mid-cervical region, unspecified level: Secondary | ICD-10-CM | POA: Diagnosis not present

## 2019-11-25 DIAGNOSIS — M542 Cervicalgia: Secondary | ICD-10-CM | POA: Diagnosis not present

## 2019-11-25 DIAGNOSIS — M9901 Segmental and somatic dysfunction of cervical region: Secondary | ICD-10-CM | POA: Diagnosis not present

## 2019-11-25 DIAGNOSIS — M9902 Segmental and somatic dysfunction of thoracic region: Secondary | ICD-10-CM | POA: Diagnosis not present

## 2019-12-05 DIAGNOSIS — M5032 Other cervical disc degeneration, mid-cervical region, unspecified level: Secondary | ICD-10-CM | POA: Diagnosis not present

## 2019-12-05 DIAGNOSIS — M9901 Segmental and somatic dysfunction of cervical region: Secondary | ICD-10-CM | POA: Diagnosis not present

## 2019-12-05 DIAGNOSIS — M9902 Segmental and somatic dysfunction of thoracic region: Secondary | ICD-10-CM | POA: Diagnosis not present

## 2019-12-05 DIAGNOSIS — M542 Cervicalgia: Secondary | ICD-10-CM | POA: Diagnosis not present

## 2019-12-09 DIAGNOSIS — M9901 Segmental and somatic dysfunction of cervical region: Secondary | ICD-10-CM | POA: Diagnosis not present

## 2019-12-09 DIAGNOSIS — M542 Cervicalgia: Secondary | ICD-10-CM | POA: Diagnosis not present

## 2019-12-09 DIAGNOSIS — M5032 Other cervical disc degeneration, mid-cervical region, unspecified level: Secondary | ICD-10-CM | POA: Diagnosis not present

## 2019-12-09 DIAGNOSIS — M9902 Segmental and somatic dysfunction of thoracic region: Secondary | ICD-10-CM | POA: Diagnosis not present

## 2019-12-12 DIAGNOSIS — M9902 Segmental and somatic dysfunction of thoracic region: Secondary | ICD-10-CM | POA: Diagnosis not present

## 2019-12-12 DIAGNOSIS — M5032 Other cervical disc degeneration, mid-cervical region, unspecified level: Secondary | ICD-10-CM | POA: Diagnosis not present

## 2019-12-12 DIAGNOSIS — M542 Cervicalgia: Secondary | ICD-10-CM | POA: Diagnosis not present

## 2019-12-12 DIAGNOSIS — M9901 Segmental and somatic dysfunction of cervical region: Secondary | ICD-10-CM | POA: Diagnosis not present

## 2019-12-15 DIAGNOSIS — M542 Cervicalgia: Secondary | ICD-10-CM | POA: Diagnosis not present

## 2019-12-15 DIAGNOSIS — M9902 Segmental and somatic dysfunction of thoracic region: Secondary | ICD-10-CM | POA: Diagnosis not present

## 2019-12-15 DIAGNOSIS — M9901 Segmental and somatic dysfunction of cervical region: Secondary | ICD-10-CM | POA: Diagnosis not present

## 2019-12-15 DIAGNOSIS — M5032 Other cervical disc degeneration, mid-cervical region, unspecified level: Secondary | ICD-10-CM | POA: Diagnosis not present

## 2019-12-20 DIAGNOSIS — M9902 Segmental and somatic dysfunction of thoracic region: Secondary | ICD-10-CM | POA: Diagnosis not present

## 2019-12-20 DIAGNOSIS — M5032 Other cervical disc degeneration, mid-cervical region, unspecified level: Secondary | ICD-10-CM | POA: Diagnosis not present

## 2019-12-20 DIAGNOSIS — M542 Cervicalgia: Secondary | ICD-10-CM | POA: Diagnosis not present

## 2019-12-20 DIAGNOSIS — M9901 Segmental and somatic dysfunction of cervical region: Secondary | ICD-10-CM | POA: Diagnosis not present

## 2019-12-27 DIAGNOSIS — M9901 Segmental and somatic dysfunction of cervical region: Secondary | ICD-10-CM | POA: Diagnosis not present

## 2019-12-27 DIAGNOSIS — M5032 Other cervical disc degeneration, mid-cervical region, unspecified level: Secondary | ICD-10-CM | POA: Diagnosis not present

## 2019-12-27 DIAGNOSIS — M9902 Segmental and somatic dysfunction of thoracic region: Secondary | ICD-10-CM | POA: Diagnosis not present

## 2019-12-27 DIAGNOSIS — M542 Cervicalgia: Secondary | ICD-10-CM | POA: Diagnosis not present

## 2019-12-30 DIAGNOSIS — M542 Cervicalgia: Secondary | ICD-10-CM | POA: Diagnosis not present

## 2019-12-30 DIAGNOSIS — M5032 Other cervical disc degeneration, mid-cervical region, unspecified level: Secondary | ICD-10-CM | POA: Diagnosis not present

## 2019-12-30 DIAGNOSIS — M9901 Segmental and somatic dysfunction of cervical region: Secondary | ICD-10-CM | POA: Diagnosis not present

## 2019-12-30 DIAGNOSIS — M9902 Segmental and somatic dysfunction of thoracic region: Secondary | ICD-10-CM | POA: Diagnosis not present

## 2020-01-02 DIAGNOSIS — M542 Cervicalgia: Secondary | ICD-10-CM | POA: Diagnosis not present

## 2020-01-02 DIAGNOSIS — M9902 Segmental and somatic dysfunction of thoracic region: Secondary | ICD-10-CM | POA: Diagnosis not present

## 2020-01-02 DIAGNOSIS — M5032 Other cervical disc degeneration, mid-cervical region, unspecified level: Secondary | ICD-10-CM | POA: Diagnosis not present

## 2020-01-02 DIAGNOSIS — M9901 Segmental and somatic dysfunction of cervical region: Secondary | ICD-10-CM | POA: Diagnosis not present

## 2020-01-06 DIAGNOSIS — M9902 Segmental and somatic dysfunction of thoracic region: Secondary | ICD-10-CM | POA: Diagnosis not present

## 2020-01-06 DIAGNOSIS — M9901 Segmental and somatic dysfunction of cervical region: Secondary | ICD-10-CM | POA: Diagnosis not present

## 2020-01-06 DIAGNOSIS — M5032 Other cervical disc degeneration, mid-cervical region, unspecified level: Secondary | ICD-10-CM | POA: Diagnosis not present

## 2020-01-06 DIAGNOSIS — M542 Cervicalgia: Secondary | ICD-10-CM | POA: Diagnosis not present

## 2020-01-13 DIAGNOSIS — M9901 Segmental and somatic dysfunction of cervical region: Secondary | ICD-10-CM | POA: Diagnosis not present

## 2020-01-13 DIAGNOSIS — M9902 Segmental and somatic dysfunction of thoracic region: Secondary | ICD-10-CM | POA: Diagnosis not present

## 2020-01-13 DIAGNOSIS — M542 Cervicalgia: Secondary | ICD-10-CM | POA: Diagnosis not present

## 2020-01-13 DIAGNOSIS — M5032 Other cervical disc degeneration, mid-cervical region, unspecified level: Secondary | ICD-10-CM | POA: Diagnosis not present

## 2020-01-20 DIAGNOSIS — M542 Cervicalgia: Secondary | ICD-10-CM | POA: Diagnosis not present

## 2020-01-20 DIAGNOSIS — M9901 Segmental and somatic dysfunction of cervical region: Secondary | ICD-10-CM | POA: Diagnosis not present

## 2020-01-20 DIAGNOSIS — M5032 Other cervical disc degeneration, mid-cervical region, unspecified level: Secondary | ICD-10-CM | POA: Diagnosis not present

## 2020-01-20 DIAGNOSIS — M9902 Segmental and somatic dysfunction of thoracic region: Secondary | ICD-10-CM | POA: Diagnosis not present

## 2020-01-27 DIAGNOSIS — M9901 Segmental and somatic dysfunction of cervical region: Secondary | ICD-10-CM | POA: Diagnosis not present

## 2020-01-27 DIAGNOSIS — M542 Cervicalgia: Secondary | ICD-10-CM | POA: Diagnosis not present

## 2020-01-27 DIAGNOSIS — M9902 Segmental and somatic dysfunction of thoracic region: Secondary | ICD-10-CM | POA: Diagnosis not present

## 2020-01-27 DIAGNOSIS — M5032 Other cervical disc degeneration, mid-cervical region, unspecified level: Secondary | ICD-10-CM | POA: Diagnosis not present

## 2020-02-01 ENCOUNTER — Encounter: Payer: Self-pay | Admitting: Family Medicine

## 2020-02-01 ENCOUNTER — Ambulatory Visit: Payer: 59 | Admitting: Family Medicine

## 2020-02-01 ENCOUNTER — Other Ambulatory Visit: Payer: Self-pay | Admitting: Family Medicine

## 2020-02-01 ENCOUNTER — Other Ambulatory Visit: Payer: Self-pay

## 2020-02-01 VITALS — BP 110/66 | HR 69 | Temp 97.7°F | Ht 66.0 in | Wt 233.0 lb

## 2020-02-01 DIAGNOSIS — E785 Hyperlipidemia, unspecified: Secondary | ICD-10-CM

## 2020-02-01 DIAGNOSIS — E559 Vitamin D deficiency, unspecified: Secondary | ICD-10-CM

## 2020-02-01 DIAGNOSIS — Z9189 Other specified personal risk factors, not elsewhere classified: Secondary | ICD-10-CM

## 2020-02-01 DIAGNOSIS — F419 Anxiety disorder, unspecified: Secondary | ICD-10-CM

## 2020-02-01 DIAGNOSIS — Z1159 Encounter for screening for other viral diseases: Secondary | ICD-10-CM | POA: Diagnosis not present

## 2020-02-01 DIAGNOSIS — G4709 Other insomnia: Secondary | ICD-10-CM | POA: Diagnosis not present

## 2020-02-01 DIAGNOSIS — E119 Type 2 diabetes mellitus without complications: Secondary | ICD-10-CM | POA: Diagnosis not present

## 2020-02-01 DIAGNOSIS — E039 Hypothyroidism, unspecified: Secondary | ICD-10-CM

## 2020-02-01 DIAGNOSIS — E1169 Type 2 diabetes mellitus with other specified complication: Secondary | ICD-10-CM | POA: Diagnosis not present

## 2020-02-01 LAB — COMPREHENSIVE METABOLIC PANEL
ALT: 15 U/L (ref 0–35)
AST: 12 U/L (ref 0–37)
Albumin: 4.3 g/dL (ref 3.5–5.2)
Alkaline Phosphatase: 75 U/L (ref 39–117)
BUN: 18 mg/dL (ref 6–23)
CO2: 25 mEq/L (ref 19–32)
Calcium: 9.8 mg/dL (ref 8.4–10.5)
Chloride: 105 mEq/L (ref 96–112)
Creatinine, Ser: 0.68 mg/dL (ref 0.40–1.20)
GFR: 95.69 mL/min (ref >60.00)
Glucose, Bld: 89 mg/dL (ref 70–99)
Potassium: 4.1 mEq/L (ref 3.5–5.1)
Sodium: 137 mEq/L (ref 135–145)
Total Bilirubin: 0.5 mg/dL (ref 0.2–1.2)
Total Protein: 6.7 g/dL (ref 6.0–8.3)

## 2020-02-01 LAB — CBC WITH DIFFERENTIAL/PLATELET
Basophils Absolute: 0.1 10*3/uL (ref 0.0–0.1)
Basophils Relative: 1.2 % (ref 0.0–3.0)
Eosinophils Absolute: 0.2 10*3/uL (ref 0.0–0.7)
Eosinophils Relative: 2.2 % (ref 0.0–5.0)
HCT: 42.1 % (ref 36.0–46.0)
Hemoglobin: 14.4 g/dL (ref 12.0–15.0)
Lymphocytes Relative: 27 % (ref 12.0–46.0)
Lymphs Abs: 2 10*3/uL (ref 0.7–4.0)
MCHC: 34.3 g/dL (ref 30.0–36.0)
MCV: 87.3 fl (ref 78.0–100.0)
Monocytes Absolute: 0.4 10*3/uL (ref 0.1–1.0)
Monocytes Relative: 5.3 % (ref 3.0–12.0)
Neutro Abs: 4.7 10*3/uL (ref 1.4–7.7)
Neutrophils Relative %: 64.3 % (ref 43.0–77.0)
Platelets: 370 10*3/uL (ref 150.0–400.0)
RBC: 4.82 Mil/uL (ref 3.87–5.11)
RDW: 13.4 % (ref 11.5–15.5)
WBC: 7.3 10*3/uL (ref 4.0–10.5)

## 2020-02-01 LAB — LIPID PANEL
Cholesterol: 175 mg/dL (ref 0–200)
HDL: 37.8 mg/dL — ABNORMAL LOW (ref >39.00)
LDL Cholesterol: 120 mg/dL — ABNORMAL HIGH (ref 0–99)
NonHDL: 137.02
Total CHOL/HDL Ratio: 5
Triglycerides: 83 mg/dL (ref 0.0–149.0)
VLDL: 16.6 mg/dL (ref 0.0–40.0)

## 2020-02-01 LAB — TSH: TSH: 1.69 u[IU]/mL (ref 0.35–4.50)

## 2020-02-01 LAB — VITAMIN D 25 HYDROXY (VIT D DEFICIENCY, FRACTURES): VITD: 72.32 ng/mL (ref 30.00–100.00)

## 2020-02-01 LAB — HEMOGLOBIN A1C: Hgb A1c MFr Bld: 5.7 % (ref 4.6–6.5)

## 2020-02-01 MED ORDER — TRAZODONE HCL 50 MG PO TABS: 50.0000 mg | ORAL_TABLET | Freq: Every day | ORAL | 2 refills | Status: DC

## 2020-02-01 MED FILL — traZODone HCL 50 MG TABS: 50 | 90 days supply | Qty: 90 | Fill #0

## 2020-02-01 NOTE — Patient Instructions (Signed)
-  would call gyn and re do mirena!  -you are doing great! Look awesome. Keep up the good work.   -see you in 6 months!   Aw

## 2020-02-01 NOTE — Progress Notes (Signed)
Patient: Tammy Mooney MRN: [id number] DOB: [birth year] PCP: Orma Flaming, MD     Subjective:  Chief Complaint  Patient presents with  . Anxiety  . Diabetes  . vitamin D deficiency  . Hyperlipidemia  . Hypothyroidism    HPI: The patient is a 39 y.o. female who presents today for Anxiety and Diabetes. Pt is requesting her strings be checked. She says that it has almost been 5 yrs.  Diabetes: Patient is here for follow up of type 2 diabetes. First diagnosed 2020.  Currently on the following medications none. Last A1C was 5.6. Currently exercising and following diabetic diet.  Denies any hypoglycemic events. Denies any vision changes, nausea, vomiting, abdominal pain, ulcers/paraesthesia in feet, polyuria, polydipsia or polyphagia. Denies any chest pain, shortness of breath.   Hypothyroidism -takes medication daily. No symptoms of hypo or hyper thyroid. Weight has been stable.   Anxiety Currently on lexapro 20mg  daily. Anxiety started after her 3rd kid and she states she is very well controlled. Has been on lexapro for about 2 years. No desire to change dosage. Feels very well controlled.   Hyperlipidemia On no medication. She is fasting today and it has been 2 years since this was checked. Has had gastric sleeve surgery. Weight slowly decreasing. Really working out more. Does not smoke, does have very well controlled diabetes. No HTN. Hx of MI/CAD in her father.  The 10-year ASCVD risk score Mikey Bussing DC Jr., et al., 2013) is: 1.4%   Vitamin D deficiency -repleted and time for recheck. On daily vitamin d.   Verdis Frederickson is due to come out in December, wanting to know if she should have another put in.   Has had covid vaccines.   Review of Systems  Constitutional: Negative for chills, fatigue and fever.  HENT: Negative for dental problem, ear pain, hearing loss and trouble swallowing.   Eyes: Negative for visual disturbance.  Respiratory: Negative for cough, chest tightness and  shortness of breath.   Cardiovascular: Negative for chest pain, palpitations and leg swelling.  Gastrointestinal: Negative for abdominal pain, blood in stool, diarrhea, nausea and vomiting.  Endocrine: Negative for cold intolerance, polydipsia, polyphagia and polyuria.  Genitourinary: Negative for dysuria and hematuria.  Musculoskeletal: Negative for arthralgias.  Skin: Negative for rash.  Neurological: Negative for dizziness, light-headedness and headaches.  Psychiatric/Behavioral: Negative for dysphoric mood and sleep disturbance. The patient is not nervous/anxious.     Allergies Patient is allergic to adhesive [tape] and penicillins.  Past Medical History Patient  has a past medical history of Anxiety, Coronavirus infection, DM (diabetes mellitus), type 2 ([place]), Hypothyroidism, Infertility, female, Low HDL (under 40), PCOS (polycystic ovarian syndrome), Varicella, and Vertigo.  Surgical History Patient  has a past surgical history that includes Cesarean section; Tonsillectomy; Eye surgery; Nasal sinus surgery; Cesarean section (07/01/2012); Cesarean section (N/A, 10/25/2014); Mandible surgery (AGE 22); Image guided sinus surgery (N/A, 06/05/2016); Turbinate reduction (Bilateral, 06/05/2016); Sinusotomy (Left, 06/05/2016); Maxillary antrostomy (Right, 06/05/2016); Hardware Removal (Left, 06/05/2016); and Laparoscopic gastric sleeve resection (N/A, 04/05/2019).  Family History Pateint's family history includes Depression in her mother; Diabetes in her brother, mother, and paternal grandmother; Heart attack in her father; Heart disease in her father and maternal grandfather; Hyperlipidemia in her father; Hypertension in her father; Obesity in her mother; Sleep apnea in her mother.  Social History Patient  reports that she has never smoked. She has never used smokeless tobacco. She reports that she does not drink alcohol and does not use drugs.  Objective: Vitals:   02/01/20 0803  BP:  110/66  Pulse: 69  Temp: 97.7 F (36.5 C)  TempSrc: Temporal  SpO2: 99%  Weight: 233 lb (105.7 kg)  Height: 5\' 6"  (1.676 m)    Body mass index is 37.61 kg/m.  Physical Exam Vitals reviewed.  Constitutional:      Appearance: Normal appearance. She is well-developed. She is obese.  HENT:     Head: Normocephalic and atraumatic.     Right Ear: Tympanic membrane, ear canal and external ear normal.     Left Ear: Tympanic membrane, ear canal and external ear normal.     Mouth/Throat:     Mouth: Mucous membranes are moist.  Eyes:     Extraocular Movements: Extraocular movements intact.     Conjunctiva/sclera: Conjunctivae normal.     Pupils: Pupils are equal, round, and reactive to light.  Neck:     Thyroid: No thyromegaly.  Cardiovascular:     Rate and Rhythm: Normal rate and regular rhythm.     Pulses: Normal pulses.     Heart sounds: Normal heart sounds. No murmur heard.   Pulmonary:     Effort: Pulmonary effort is normal.     Breath sounds: Normal breath sounds.  Abdominal:     General: Bowel sounds are normal. There is no distension.     Palpations: Abdomen is soft.     Tenderness: There is no abdominal tenderness.  Musculoskeletal:     Cervical back: Normal range of motion and neck supple.  Lymphadenopathy:     Cervical: No cervical adenopathy.  Skin:    General: Skin is warm and dry.     Capillary Refill: Capillary refill takes less than 2 seconds.     Findings: No rash.  Neurological:     General: No focal deficit present.     Mental Status: She is alert and oriented to person, place, and time.     Cranial Nerves: No cranial nerve deficit.     Coordination: Coordination normal.     Deep Tendon Reflexes: Reflexes normal.  Psychiatric:        Mood and Affect: Mood normal.        Behavior: Behavior normal.      Office Visit from 02/01/2020 in [facility]  PHQ-2 Total Score 0          GAD 7 : Generalized Anxiety Score 02/01/2020  07/27/2019 10/15/2018 07/19/2018  Nervous, Anxious, on Edge 1 0 3 1  Control/stop worrying 1 0 3 0  Worry too much - different things 0 0 3 1  Trouble relaxing 0 0 3 1  Restless 0 0 0 0  Easily annoyed or irritable 1 0 3 1  Afraid - awful might happen 0 0 0 0  Total GAD 7 Score 3 0 15 4  Anxiety Difficulty Not difficult at all Not difficult at all Somewhat difficult Not difficult at all      Assessment/plan: 1. Type 2 diabetes mellitus without complication, without long-term current use of insulin ([name]) Very well controlled. Checking today. Continue diet/exercise and weight loss. F/u in 6 months.  - CBC with Differential/Platelet - Comprehensive metabolic panel - Hemoglobin A1c  2. Anxiety GAD7 score is minimal. Continue with lexapro. Very well controlled. F/u in 6 months. Continue exercise.   3. Primary hypothyroidism - TSH  4. Vitamin D deficiency  - VITAMIN D 25 Hydroxy (Vit-D Deficiency, Fractures)  5. Other insomnia  - traZODone (DESYREL) 50 MG tablet;  Take 1 tablet (50 mg total) by mouth at bedtime.  Dispense: 90 tablet; Refill: 2  6. Hyperlipidemia -recheck today. Doing really well with diet/exercise and weight loss.   Replace IUD in December/january.   This visit occurred during the SARS-CoV-2 public health emergency.  Safety protocols were in place, including screening questions prior to the visit, additional usage of staff PPE, and extensive cleaning of exam room while observing appropriate contact time as indicated for disinfecting solutions.     Return in about 6 months (around 08/03/2020) for anxiety/diabetes .   Orma Flaming, MD [place]    02/01/2020

## 2020-02-02 LAB — HEPATITIS C ANTIBODY
Hepatitis C Ab: NONREACTIVE
SIGNAL TO CUT-OFF: 0.01 (ref <1.00)

## 2020-02-03 DIAGNOSIS — M5032 Other cervical disc degeneration, mid-cervical region, unspecified level: Secondary | ICD-10-CM | POA: Diagnosis not present

## 2020-02-03 DIAGNOSIS — M9902 Segmental and somatic dysfunction of thoracic region: Secondary | ICD-10-CM | POA: Diagnosis not present

## 2020-02-03 DIAGNOSIS — M9901 Segmental and somatic dysfunction of cervical region: Secondary | ICD-10-CM | POA: Diagnosis not present

## 2020-02-03 DIAGNOSIS — M542 Cervicalgia: Secondary | ICD-10-CM | POA: Diagnosis not present

## 2020-02-20 MED FILL — LEVOTHYROXINE 112 MCG TAB: 112 | 90 days supply | Qty: 90 | Fill #1

## 2020-02-20 MED FILL — traZODone HCL 50 MG TABS: 50 | 90 days supply | Qty: 90 | Fill #0

## 2020-02-20 MED FILL — ESCITALOPRAM 20 MG TABLET: 20 | 90 days supply | Qty: 90 | Fill #0

## 2020-03-02 DIAGNOSIS — M542 Cervicalgia: Secondary | ICD-10-CM | POA: Diagnosis not present

## 2020-03-02 DIAGNOSIS — M9901 Segmental and somatic dysfunction of cervical region: Secondary | ICD-10-CM | POA: Diagnosis not present

## 2020-03-02 DIAGNOSIS — M9902 Segmental and somatic dysfunction of thoracic region: Secondary | ICD-10-CM | POA: Diagnosis not present

## 2020-03-02 DIAGNOSIS — M5032 Other cervical disc degeneration, mid-cervical region, unspecified level: Secondary | ICD-10-CM | POA: Diagnosis not present

## 2020-04-10 ENCOUNTER — Other Ambulatory Visit: Payer: 59

## 2020-04-10 DIAGNOSIS — Z20822 Contact with and (suspected) exposure to covid-19: Secondary | ICD-10-CM

## 2020-04-12 LAB — SARS-COV-2, NAA 2 DAY TAT

## 2020-04-12 LAB — NOVEL CORONAVIRUS, NAA: SARS-CoV-2, NAA: NOT DETECTED

## 2020-05-10 ENCOUNTER — Other Ambulatory Visit: Payer: Self-pay | Admitting: Family Medicine

## 2020-05-10 DIAGNOSIS — F419 Anxiety disorder, unspecified: Secondary | ICD-10-CM

## 2020-05-10 DIAGNOSIS — E039 Hypothyroidism, unspecified: Secondary | ICD-10-CM

## 2020-05-10 MED FILL — traZODone HCL 50 MG TABS: 50 | 90 days supply | Qty: 90 | Fill #1

## 2020-05-10 MED FILL — ESCITALOPRAM 20 MG TABLET: 20 | 90 days supply | Qty: 90 | Fill #0

## 2020-05-10 MED FILL — LEVOTHYROXINE 112 MCG TAB: 112 | 90 days supply | Qty: 90 | Fill #0

## 2020-05-11 ENCOUNTER — Other Ambulatory Visit: Payer: Self-pay

## 2020-05-11 ENCOUNTER — Other Ambulatory Visit: Payer: Self-pay | Admitting: Family Medicine

## 2020-05-11 ENCOUNTER — Ambulatory Visit: Payer: 59 | Admitting: Family Medicine

## 2020-05-11 ENCOUNTER — Encounter: Payer: Self-pay | Admitting: Family Medicine

## 2020-05-11 VITALS — BP 124/84 | HR 68 | Temp 97.6°F | Ht 66.0 in | Wt 237.0 lb

## 2020-05-11 DIAGNOSIS — R590 Localized enlarged lymph nodes: Secondary | ICD-10-CM | POA: Diagnosis not present

## 2020-05-11 DIAGNOSIS — F32A Depression, unspecified: Secondary | ICD-10-CM | POA: Diagnosis not present

## 2020-05-11 DIAGNOSIS — F419 Anxiety disorder, unspecified: Secondary | ICD-10-CM

## 2020-05-11 MED ORDER — BUPROPION HCL ER (XL) 150 MG PO TB24: 150.0000 mg | ORAL_TABLET | Freq: Every day | ORAL | 1 refills | Status: DC

## 2020-05-11 NOTE — Patient Instructions (Signed)
-  adding on wellbutrin 150mg /day. Will help with depression and weight loss, but watch anxiety. Can make this worse. Take in AM. Do not take at same time as trazodone. Let me know if you hate this.    -ultrasound of your neck/glands. If abnormal we will CT. Checking thyroid today.     See you in one month.

## 2020-05-11 NOTE — Progress Notes (Signed)
Patient: Tammy Mooney MRN: [id number] DOB: [birth year] PCP: Tammy Flaming, MD     Subjective:  Chief Complaint  Patient presents with  . Depression     Rx lexapro--  . Mass    Pt stated --lump below chin.    HPI: The patient is a 39 y.o. female who presents today for mass under neck and depression/anxiety.   Lump on side of throat First noticed this around the beginning of September. She states she did have a cold and is not sure if this was related. IT is hard and fells like a pea on the left lower side of her mid neck.  She also feels like she may feel another lump just below this one. No problems swallowing, no change in her voice, no weight loss. No night sweats/fever/chills. NO easy bruising or bleeding. No hx of thyroid cancer in the family. Doesn't necessarily think it's growing. Tender at times.   Depression/anxiety She feels like she has been more down over the past 2 months. No trigger or reason. She has been crying more and is just down. She is on 20mg  of lexapro and just isn't sure it's helping anymore. Has been on this x 2 years.+ hx of depression in her family. She is on the lexapro for anxiety, but states these feelings do not feel like anxiey to her.  She is not in counseling. Never done, but not opposed to start this. Husband is very supportive.    Review of Systems  Constitutional: Negative for chills, diaphoresis, fever and unexpected weight change.  HENT: Negative for congestion.   Respiratory: Negative for cough and shortness of breath.   Cardiovascular: Negative for chest pain, palpitations and leg swelling.  Gastrointestinal: Negative for abdominal pain, diarrhea and nausea.  Hematological: Positive for adenopathy. Does not bruise/bleed easily.  Psychiatric/Behavioral: Positive for dysphoric mood. Negative for suicidal ideas. The patient is not nervous/anxious.     Allergies Patient is allergic to adhesive [tape] and penicillins.  Past Medical  History Patient  has a past medical history of Anxiety, Coronavirus infection, DM (diabetes mellitus), type 2 ([place]), Hypothyroidism, Infertility, female, Low HDL (under 40), PCOS (polycystic ovarian syndrome), Varicella, and Vertigo.  Surgical History Patient  has a past surgical history that includes Cesarean section; Tonsillectomy; Eye surgery; Nasal sinus surgery; Cesarean section (07/01/2012); Cesarean section (N/A, 10/25/2014); Mandible surgery (AGE 60); Image guided sinus surgery (N/A, 06/05/2016); Turbinate reduction (Bilateral, 06/05/2016); Sinusotomy (Left, 06/05/2016); Maxillary antrostomy (Right, 06/05/2016); Hardware Removal (Left, 06/05/2016); and Laparoscopic gastric sleeve resection (N/A, 04/05/2019).  Family History Pateint's family history includes Depression in her mother; Diabetes in her brother, mother, and paternal grandmother; Heart attack in her father; Heart disease in her father and maternal grandfather; Hyperlipidemia in her father; Hypertension in her father; Obesity in her mother; Sleep apnea in her mother.  Social History Patient  reports that she has never smoked. She has never used smokeless tobacco. She reports that she does not drink alcohol and does not use drugs.    Objective: Vitals:   05/11/20 0859  BP: 124/84  Pulse: 68  Temp: 97.6 F (36.4 C)  SpO2: 97%  Weight: 237 lb (107.5 kg)  Height: 5\' 6"  (1.676 m)    Body mass index is 38.25 kg/m.  Physical Exam Vitals reviewed.  Constitutional:      Appearance: Normal appearance. She is obese.  HENT:     Head: Normocephalic and atraumatic.  Neck:     Comments: She has a hard very small  mass just inferior to left submandibular gland as well as possible larger lymph node vs. Mass superior/lateral to left thyroid lobe. Mobile, nontender Cardiovascular:     Rate and Rhythm: Normal rate and regular rhythm.     Heart sounds: Normal heart sounds.  Pulmonary:     Effort: Pulmonary effort is normal.      Breath sounds: Normal breath sounds.  Abdominal:     General: Bowel sounds are normal.     Palpations: Abdomen is soft.  Musculoskeletal:     Cervical back: Normal range of motion and neck supple.  Neurological:     General: No focal deficit present.     Mental Status: She is alert and oriented to person, place, and time.  Psychiatric:        Mood and Affect: Mood normal.        Behavior: Behavior normal.     Comments: Tearful at times. No si/hi/ah/vh       Office Visit from 05/11/2020 in [facility]  PHQ-9 Total Score 6     GAD 7 : Generalized Anxiety Score 05/11/2020 02/01/2020 07/27/2019 10/15/2018  Nervous, Anxious, on Edge 1 1 0 3  Control/stop worrying 0 1 0 3  Worry too much - different things 1 0 0 3  Trouble relaxing 0 0 0 3  Restless 1 0 0 0  Easily annoyed or irritable 0 1 0 3  Afraid - awful might happen 0 0 0 0  Total GAD 7 Score 3 3 0 15  Anxiety Difficulty - Not difficult at all Not difficult at all Somewhat difficult        Assessment/plan: 1. Anxiety and depression GAD7 score is mild and her phq9 score is mild, but clinically her depression seems much worse and is interfering with her life. We are going to keep lexapro 20mg  and add on wellbutrin to see if we can get better control of depression as well as weight loss. She has no history of seizures. Discussed if she feels like her anxiety is worse we will need to stop it. Also discussed counseling and lisa flores card given. Already exercising. Will check thyroid level as well. Close f/u in one month.  - TSH; Future - TSH  2. Lymphadenopathy, submandibular Do not like firm feeling. Checking ultrasound and will go from there.  - US Soft Tissue Head/Neck (NON-THYROID); Future    This visit occurred during the SARS-CoV-2 public health emergency.  Safety protocols were in place, including screening questions prior to the visit, additional usage of staff PPE, and extensive cleaning of exam  room while observing appropriate contact time as indicated for disinfecting solutions.     Return in about 1 month (around 06/11/2020) for depression/anxiety .     Tammy Flaming, MD [place]  05/11/2020

## 2020-05-11 NOTE — Progress Notes (Deleted)
Patient: Tammy Mooney MRN: [id number] DOB: 02-22-1981 PCP: Orma Flaming, MD     Subjective:  Chief Complaint  Patient presents with  . Depression     Rx lexapro--  . Mass    Pt stated --lump below chin.    HPI: The patient is a 39 y.o. female who presents today for lump on the left side of her throat. She first noticed this around beginning of September. She states she did have cold and not sure if related. It is hard and feels like a pea. She also feels like she may feel another lump just below this one. No problems swallowing, no change In voice, no weight loss. No night sweats/fever/chills. No easy bruising or bleeding. No hx of thyroid cancer in the family.   Depression/anxiety She feels like she has just been more down over the past 2 months. She has been crying more and is just down. She isn't sure why. No triggering factor. She is on 20mg  of lexapro and just isn't sure it's helping her anymore. She has been on this x 2+ years. She does have a history of depression in her family. She is on lexapro for her anxiety, but this doesn't really feel like anxiety to her. She is not in counseling. She has never done, but is not opposed to it. Husband is very supportive.   Review of Systems  Allergies Patient is allergic to adhesive [tape] and penicillins.  Past Medical History Patient  has a past medical history of Anxiety, Coronavirus infection, DM (diabetes mellitus), type 2 ([place]), Hypothyroidism, Infertility, female, Low HDL (under 40), PCOS (polycystic ovarian syndrome), Varicella, and Vertigo.  Surgical History Patient  has a past surgical history that includes Cesarean section; Tonsillectomy; Eye surgery; Nasal sinus surgery; Cesarean section (07/01/2012); Cesarean section (N/A, 10/25/2014); Mandible surgery (AGE 48); Image guided sinus surgery (N/A, 06/05/2016); Turbinate reduction (Bilateral, 06/05/2016); Sinusotomy (Left, 06/05/2016); Maxillary antrostomy (Right,  06/05/2016); Hardware Removal (Left, 06/05/2016); and Laparoscopic gastric sleeve resection (N/A, 04/05/2019).  Family History Pateint's family history includes Depression in her mother; Diabetes in her brother, mother, and paternal grandmother; Heart attack in her father; Heart disease in her father and maternal grandfather; Hyperlipidemia in her father; Hypertension in her father; Obesity in her mother; Sleep apnea in her mother.  Social History Patient  reports that she has never smoked. She has never used smokeless tobacco. She reports that she does not drink alcohol and does not use drugs.    Objective: Vitals:   05/11/20 0859  BP: 124/84  Pulse: 68  Temp: 97.6 F (36.4 C)  SpO2: 97%  Weight: 237 lb (107.5 kg)  Height: 5\' 6"  (1.676 m)    Body mass index is 38.25 kg/m.  Physical Exam Vitals reviewed.  Constitutional:      Appearance: Normal appearance. She is obese.  HENT:     Head: Normocephalic and atraumatic.  Neck:     Comments: Enlarged and hard left submandibular gland with smaller pea size mass on top. Slightly TTP    Neurological:     Mental Status: She is alert.        Assessment/plan:    This visit occurred during the SARS-CoV-2 public health emergency.  Safety protocols were in place, including screening questions prior to the visit, additional usage of staff PPE, and extensive cleaning of exam room while observing appropriate contact time as indicated for disinfecting solutions.     Return in about 1 month (around 06/11/2020) for depression/anxiety .  Orma Flaming, MD [place]  05/11/2020

## 2020-05-12 LAB — TSH: TSH: 2.87 mIU/L

## 2020-05-17 ENCOUNTER — Encounter: Payer: Self-pay | Admitting: Family Medicine

## 2020-05-28 MED FILL — buPROPion HCL ER (XL) 150 M: 150 | 90 days supply | Qty: 90 | Fill #0

## 2020-05-31 ENCOUNTER — Ambulatory Visit
Admission: RE | Admit: 2020-05-31 | Discharge: 2020-05-31 | Disposition: A | Discharge status: Home / Self Care | Payer: 59 | Source: Ambulatory Visit | Attending: Family Medicine | Admitting: Family Medicine

## 2020-05-31 DIAGNOSIS — R221 Localized swelling, mass and lump, neck: Secondary | ICD-10-CM | POA: Diagnosis not present

## 2020-05-31 DIAGNOSIS — R590 Localized enlarged lymph nodes: Secondary | ICD-10-CM

## 2020-06-18 ENCOUNTER — Encounter: Payer: Self-pay | Admitting: Family Medicine

## 2020-06-18 ENCOUNTER — Telehealth (INDEPENDENT_AMBULATORY_CARE_PROVIDER_SITE_OTHER): Payer: 59 | Admitting: Family Medicine

## 2020-06-18 VITALS — Ht 66.0 in | Wt 237.0 lb

## 2020-06-18 DIAGNOSIS — F32A Depression, unspecified: Secondary | ICD-10-CM | POA: Diagnosis not present

## 2020-06-18 DIAGNOSIS — F419 Anxiety disorder, unspecified: Secondary | ICD-10-CM | POA: Diagnosis not present

## 2020-06-18 NOTE — Progress Notes (Signed)
Patient: Tammy Mooney MRN: [id number] DOB: 1980/09/29 PCP: Orma Flaming, MD      I connected with Lorenza Cambridge on 06/18/20 at 8:46am by a video enabled telemedicine application and verified that I am speaking with the correct person using two identifiers.  Location patient: Home Location provider: [facility], Office Persons participating in this virtual visit: Nasteho Glantz and Dr. Rogers Blocker   I discussed the limitations of evaluation and management by telemedicine and the availability of in person appointments. The patient expressed understanding and agreed to proceed.   Subjective:  Chief Complaint  Patient presents with  . Depression  . Anxiety    HPI: The patient is a 39 y.o. female who presents today for Depression/Anxiety.  I started her on wellbutrin XL 150mg /daily at her last appointment. She is also on lexapro 20mg /day.  She states she has not taken the wellbutrin daily like she should since it's a new routine for her. She has not started counseling. she needs to feel out paperwork. She feels like her depression is much better. She has not cried at all and is doing well. She is a little more anxious, but thinks its due to the holidays and everything she going on. She is handling this fine. She is still exercising and doing her pilates on a regular basis. Thinking about joining the new [employer] as well for more cardio.    Had on episode of palpitations yesterday, but none before or after. Thinks related to not eating. She has not had anything since that time and was asymptomatic during the palpitations. She states she had them intermittently for 2 hours. No other symptoms. Went away after she ate.   Allergies Patient is allergic to adhesive [tape] and penicillins.  Past Medical History Patient  has a past medical history of Anxiety, Coronavirus infection, DM (diabetes mellitus), type 2 ([place]), Hypothyroidism, Infertility, female, Low HDL (under 40), PCOS  (polycystic ovarian syndrome), Varicella, and Vertigo.  Surgical History Patient  has a past surgical history that includes Cesarean section; Tonsillectomy; Eye surgery; Nasal sinus surgery; Cesarean section (07/01/2012); Cesarean section (N/A, 10/25/2014); Mandible surgery (AGE 57); Image guided sinus surgery (N/A, 06/05/2016); Turbinate reduction (Bilateral, 06/05/2016); Sinusotomy (Left, 06/05/2016); Maxillary antrostomy (Right, 06/05/2016); Hardware Removal (Left, 06/05/2016); and Laparoscopic gastric sleeve resection (N/A, 04/05/2019).  Family History Pateint's family history includes Depression in her mother; Diabetes in her brother, mother, and paternal grandmother; Heart attack in her father; Heart disease in her father and maternal grandfather; Hyperlipidemia in her father; Hypertension in her father; Obesity in her mother; Sleep apnea in her mother.  Social History Patient  reports that she has never smoked. She has never used smokeless tobacco. She reports that she does not drink alcohol and does not use drugs.    Objective: Vitals:   06/18/20 0834  Weight: 237 lb (107.5 kg)  Height: 5\' 6"  (1.676 m)    Body mass index is 38.25 kg/m.  Physical Exam Vitals reviewed.  Constitutional:      Appearance: Normal appearance.  HENT:     Head: Normocephalic and atraumatic.  Pulmonary:     Effort: Pulmonary effort is normal.  Neurological:     General: No focal deficit present.     Mental Status: She is alert and oriented to person, place, and time.  Psychiatric:        Mood and Affect: Mood normal.        Behavior: Behavior normal.  Video Visit from 06/18/2020 in [facility]  PHQ-9 Total Score 2     GAD 7 : Generalized Anxiety Score 06/18/2020 05/11/2020 02/01/2020 07/27/2019  Nervous, Anxious, on Edge 2 1 1  0  Control/stop worrying 1 0 1 0  Worry too much - different things 1 1 0 0  Trouble relaxing 2 0 0 0  Restless 0 1 0 0  Easily annoyed  or irritable 2 0 1 0  Afraid - awful might happen 0 0 0 0  Total GAD 7 Score 8 3 3  0  Anxiety Difficulty Not difficult at all - Not difficult at all Not difficult at all     Assessment/plan: 1. Anxiety and depression phq9 score significantly improved, but her GAD7 score has worsened mildly. She thinks the anxiety is due to the holidays and she is managing well. She has not taken the Wellbutrin consistently and really wants to give this a try. She also is still going to start counseling. She is doing better though. Will stay consistent on the wellbutrin and let me know. Otherwise happy with progress.   2. Submandibular lymph node Ultrasound shows normal lymph node, possibly reactive. Still there and slightly bothers her. Will see if dr. [place] Baseman will see her. If needs a referral she will just email me and let me know.   3. Isolated palpitations If continue she needs to f/u in office. precautions given.      Return if symptoms worsen or fail to improve.    Orma Flaming, MD [place]  06/18/2020

## 2020-07-02 ENCOUNTER — Encounter: Payer: Self-pay | Admitting: Family Medicine

## 2020-07-02 ENCOUNTER — Telehealth: Payer: Self-pay | Admitting: Family Medicine

## 2020-07-02 NOTE — Telephone Encounter (Signed)
Patient called stating that she injured her right shoulder on Friday during palates and it seems to be getting worse. She is concerned about a possible tear.  Please advise.

## 2020-07-05 ENCOUNTER — Other Ambulatory Visit: Payer: Self-pay | Admitting: Family Medicine

## 2020-07-05 MED ORDER — VORTIOXETINE HBR 5 MG PO TABS: ORAL_TABLET | ORAL | 0 refills | Status: DC

## 2020-07-25 ENCOUNTER — Encounter: Payer: Self-pay | Admitting: Family Medicine

## 2020-07-31 MED FILL — TRINTELLIX 5 MG TABLET: 5 | 15 days supply | Qty: 15 | Fill #1

## 2020-07-31 MED FILL — LEVOTHYROXINE 112 MCG TAB: 112 | 90 days supply | Qty: 90 | Fill #1

## 2020-07-31 MED FILL — traZODone HCL 50 MG TABS: 50 | 90 days supply | Qty: 90 | Fill #2

## 2020-08-27 ENCOUNTER — Other Ambulatory Visit: Payer: Self-pay

## 2020-09-02 ENCOUNTER — Encounter: Payer: Self-pay | Admitting: Family Medicine

## 2020-09-03 ENCOUNTER — Other Ambulatory Visit: Payer: Self-pay | Admitting: Family Medicine

## 2020-09-03 MED ORDER — VORTIOXETINE HBR 5 MG PO TABS: ORAL_TABLET | ORAL | 1 refills | Status: DC

## 2020-09-03 MED FILL — TRINTELLIX 5 MG TABLET: 5 | 90 days supply | Qty: 90 | Fill #0

## 2020-09-25 NOTE — Telephone Encounter (Signed)
Appt scheduled

## 2020-09-26 ENCOUNTER — Other Ambulatory Visit: Payer: Self-pay

## 2020-09-26 ENCOUNTER — Encounter: Payer: Self-pay | Admitting: Family Medicine

## 2020-09-26 ENCOUNTER — Ambulatory Visit: Payer: 59 | Admitting: Family Medicine

## 2020-09-26 VITALS — BP 110/70 | HR 101 | Temp 98.0°F | Ht 66.0 in | Wt 243.8 lb

## 2020-09-26 DIAGNOSIS — L309 Dermatitis, unspecified: Secondary | ICD-10-CM | POA: Diagnosis not present

## 2020-09-26 MED ORDER — KETOCONAZOLE 2 % EX CREA: 1.0000 "application " | TOPICAL_CREAM | Freq: Every day | CUTANEOUS | 0 refills | Status: DC

## 2020-09-26 MED ORDER — KETOCONAZOLE 2 % EX SHAM: 1.0000 "application " | MEDICATED_SHAMPOO | CUTANEOUS | 0 refills | Status: DC

## 2020-09-26 NOTE — Progress Notes (Signed)
Patient: Tammy Mooney MRN: [id number] DOB: 1981/03/17 PCP: Orma Flaming, MD     Subjective:  Chief Complaint  Patient presents with  . Rash    Pt says that she recently started Trintellix. She began to noticed flare ups the beginning of January. She stopped tanning and is starting to move down her neck.   . Pruritis    She also says she feels burning.    HPI: The patient is a 40 y.o. female who presents today for itching and burning through out her face, and down to her neck. She states it started on her face after she started taking trintellix in January. She already has rosacea. The rash would be on her eyes and they would get itchy and scaly and swollen and then get better she also feels her face is hot and red. The rash has now moved down into her neck. She states she has not used any new products, soaps, detergents, vitamins/food. Her eyes do not hurt and they don't hurt to move. No color change in sclera. No discharge. She also is extremely stressed.   Review of Systems  Constitutional: Negative for chills, fatigue and fever.  Eyes: Negative for pain, discharge, redness and visual disturbance.  Respiratory: Negative for cough and shortness of breath.   Cardiovascular: Negative for chest pain and palpitations.  Musculoskeletal: Negative for arthralgias.  Skin: Positive for rash.  Neurological: Negative for dizziness and headaches.    Allergies Patient is allergic to adhesive [tape] and penicillins.  Past Medical History Patient  has a past medical history of Anxiety, Coronavirus infection, DM (diabetes mellitus), type 2 ([place]), Hypothyroidism, Infertility, female, Low HDL (under 40), PCOS (polycystic ovarian syndrome), Varicella, and Vertigo.  Surgical History Patient  has a past surgical history that includes Cesarean section; Tonsillectomy; Eye surgery; Nasal sinus surgery; Cesarean section (07/01/2012); Cesarean section (N/A, 10/25/2014); Mandible surgery (AGE 67);  Image guided sinus surgery (N/A, 06/05/2016); Turbinate reduction (Bilateral, 06/05/2016); Sinusotomy (Left, 06/05/2016); Maxillary antrostomy (Right, 06/05/2016); Hardware Removal (Left, 06/05/2016); and Laparoscopic gastric sleeve resection (N/A, 04/05/2019).  Family History Pateint's family history includes Depression in her mother; Diabetes in her brother, mother, and paternal grandmother; Heart attack in her father; Heart disease in her father and maternal grandfather; Hyperlipidemia in her father; Hypertension in her father; Obesity in her mother; Sleep apnea in her mother.  Social History Patient  reports that she has never smoked. She has never used smokeless tobacco. She reports that she does not drink alcohol and does not use drugs.    Objective: Vitals:   09/26/20 1312  BP: 110/70  Pulse: (!) 101  Temp: 98 F (36.7 C)  TempSrc: Temporal  SpO2: 96%  Weight: 243 lb 12.8 oz (110.6 kg)  Height: 5\' 6"  (1.676 m)    Body mass index is 39.35 kg/m.  Physical Exam Vitals reviewed.  Constitutional:      Appearance: Normal appearance. She is obese.  HENT:     Head: Normocephalic.  Eyes:     Extraocular Movements: Extraocular movements intact.     Conjunctiva/sclera: Conjunctivae normal.     Pupils: Pupils are equal, round, and reactive to light.  Pulmonary:     Effort: Pulmonary effort is normal.  Skin:    Findings: Erythema and rash present.     Comments: Erythema over face, especially on cheeks. Also has maculopapular rash over face and on neck.   Neurological:     General: No focal deficit present.     Mental Status:  She is alert and oriented to person, place, and time.  Psychiatric:        Mood and Affect: Mood normal.        Behavior: Behavior normal.        Assessment/plan: 1. Dermatitis I do think this could be more seborrheic dermatitis. Trial of ketoconazole shampoo twice weekly and alternating between hydrocortisone cream and ketoconazole cream. Im not ready  to stop her trintellix as she is well controlled on this medication and not sure this is the cause of her rash. Will refer to derm just in case face doesn't get better with above treatment and then derm can weigh in on possible dermatitis due to medication, but will continue trintellix for now. Discussed the stress and climate change could also be contributing to her outbreak.  - Ambulatory referral to Dermatology    This visit occurred during the SARS-CoV-2 public health emergency.  Safety protocols were in place, including screening questions prior to the visit, additional usage of staff PPE, and extensive cleaning of exam room while observing appropriate contact time as indicated for disinfecting solutions.     Return if symptoms worsen or fail to improve.   Orma Flaming, MD [place]   09/26/2020

## 2020-09-26 NOTE — Patient Instructions (Addendum)
I want you to try topical hydrocortisone cream to your eyelids/face once a day and then the px ketoconazole cream once a day. Also sending in a wash for you to use twice a week. Leave on for a few weeks then rinse.   Referring to derm. If it's gets worse, let me know and i'll do oral steroids. I don't want to stop trintellix so let's try this first.   Seborrheic Dermatitis, Adult Seborrheic dermatitis is a skin disease that causes red, scaly patches. It usually occurs on the scalp, and it is often called dandruff. The patches may appear on other parts of the body. Skin patches tend to appear where there are many oil glands in the skin. Areas of the body that are commonly affected include the:  Scalp.  Ears.  Eyebrows.  Face.  Bearded area of [employer].  Skin folds of the body, such as the armpits, groin, and buttocks.  Chest. The condition may come and go for no known reason, and it is often long-lasting (chronic). What are the causes? The cause of this condition is not known. What increases the risk? The following factors may make you more likely to develop this condition:  Having certain conditions, such as: ? HIV (human immunodeficiency virus). ? AIDS (acquired immunodeficiency syndrome). ? Parkinson's disease. ? Mood disorders, such as depression.  Being 69-43 years old. What are the signs or symptoms? Symptoms of this condition include:  Thick scales on the scalp.  Redness on the face or in the armpits.  Skin that is flaky. The flakes may be white or yellow.  Skin that seems oily or dry but is not helped with moisturizers.  Itching or burning in the affected areas.   How is this diagnosed? This condition is diagnosed with a medical history and physical exam. A sample of your skin may be tested (skin biopsy). You may need to see a skin specialist (dermatologist). How is this treated? There is no cure for this condition, but treatment can help to manage the  symptoms. You may get treatment to remove scales, lower the risk of skin infection, and reduce swelling or itching. Treatment may include:  Creams that reduce skin yeast.  Medicated shampoo.  Moisturizing creams or ointments.  Creams that reduce swelling and irritation (steroids). Follow these instructions at home:  Apply over-the-counter and prescription medicines only as told by your health care provider.  Use any medicated shampoo, skin creams, or ointments only as told by your health care provider.  Keep all follow-up visits as told by your health care provider. This is important. Contact a health care provider if:  Your symptoms do not improve with treatment.  Your symptoms get worse.  You have new symptoms. Get help right away if:  Your condition rapidly worsens with treatment. Summary  Seborrheic dermatitis is a skin disease that causes red, scaly patches.  Seborrheic dermatitis commonly affects the scalp, face, and skin folds.  There is no cure for this condition, but treatment can help to manage the symptoms. This information is not intended to replace advice given to you by your health care provider. Make sure you discuss any questions you have with your health care provider. Document Revised: 04/14/2019 Document Reviewed: 04/14/2019 Elsevier Patient Education  [employer].

## 2020-10-11 ENCOUNTER — Other Ambulatory Visit (HOSPITAL_BASED_OUTPATIENT_CLINIC_OR_DEPARTMENT_OTHER): Payer: Self-pay

## 2020-10-25 ENCOUNTER — Encounter (HOSPITAL_COMMUNITY): Payer: Self-pay | Admitting: *Deleted

## 2020-11-20 ENCOUNTER — Other Ambulatory Visit: Payer: Self-pay | Admitting: Family Medicine

## 2020-11-20 ENCOUNTER — Other Ambulatory Visit (HOSPITAL_COMMUNITY): Payer: Self-pay

## 2020-11-20 DIAGNOSIS — G4709 Other insomnia: Secondary | ICD-10-CM

## 2020-11-20 DIAGNOSIS — E039 Hypothyroidism, unspecified: Secondary | ICD-10-CM

## 2020-11-21 ENCOUNTER — Other Ambulatory Visit (HOSPITAL_COMMUNITY): Payer: Self-pay

## 2020-11-21 ENCOUNTER — Encounter: Payer: Self-pay | Admitting: Family Medicine

## 2020-11-21 ENCOUNTER — Other Ambulatory Visit: Payer: Self-pay

## 2020-11-21 ENCOUNTER — Ambulatory Visit (INDEPENDENT_AMBULATORY_CARE_PROVIDER_SITE_OTHER): Payer: 59 | Admitting: Family Medicine

## 2020-11-21 VITALS — BP 112/70 | HR 84 | Temp 97.5°F | Ht 66.0 in | Wt 242.8 lb

## 2020-11-21 DIAGNOSIS — E1169 Type 2 diabetes mellitus with other specified complication: Secondary | ICD-10-CM

## 2020-11-21 DIAGNOSIS — E039 Hypothyroidism, unspecified: Secondary | ICD-10-CM

## 2020-11-21 DIAGNOSIS — L719 Rosacea, unspecified: Secondary | ICD-10-CM | POA: Diagnosis not present

## 2020-11-21 DIAGNOSIS — G4709 Other insomnia: Secondary | ICD-10-CM | POA: Diagnosis not present

## 2020-11-21 DIAGNOSIS — Z0000 Encounter for general adult medical examination without abnormal findings: Secondary | ICD-10-CM

## 2020-11-21 DIAGNOSIS — Z9884 Bariatric surgery status: Secondary | ICD-10-CM | POA: Diagnosis not present

## 2020-11-21 DIAGNOSIS — E785 Hyperlipidemia, unspecified: Secondary | ICD-10-CM | POA: Diagnosis not present

## 2020-11-21 DIAGNOSIS — E559 Vitamin D deficiency, unspecified: Secondary | ICD-10-CM | POA: Diagnosis not present

## 2020-11-21 DIAGNOSIS — E119 Type 2 diabetes mellitus without complications: Secondary | ICD-10-CM

## 2020-11-21 DIAGNOSIS — F339 Major depressive disorder, recurrent, unspecified: Secondary | ICD-10-CM | POA: Diagnosis not present

## 2020-11-21 LAB — CBC WITH DIFFERENTIAL/PLATELET
Basophils Absolute: 0.1 10*3/uL (ref 0.0–0.1)
Basophils Relative: 0.6 % (ref 0.0–3.0)
Eosinophils Absolute: 0.1 10*3/uL (ref 0.0–0.7)
Eosinophils Relative: 1.3 % (ref 0.0–5.0)
HCT: 42.2 % (ref 36.0–46.0)
Hemoglobin: 14.4 g/dL (ref 12.0–15.0)
Lymphocytes Relative: 22 % (ref 12.0–46.0)
Lymphs Abs: 2.2 10*3/uL (ref 0.7–4.0)
MCHC: 34 g/dL (ref 30.0–36.0)
MCV: 87.2 fl (ref 78.0–100.0)
Monocytes Absolute: 0.3 10*3/uL (ref 0.1–1.0)
Monocytes Relative: 3.4 % (ref 3.0–12.0)
Neutro Abs: 7.2 10*3/uL (ref 1.4–7.7)
Neutrophils Relative %: 72.7 % (ref 43.0–77.0)
Platelets: 367 10*3/uL (ref 150.0–400.0)
RBC: 4.84 Mil/uL (ref 3.87–5.11)
RDW: 13.3 % (ref 11.5–15.5)
WBC: 9.9 10*3/uL (ref 4.0–10.5)

## 2020-11-21 LAB — COMPREHENSIVE METABOLIC PANEL
ALT: 17 U/L (ref 0–35)
AST: 13 U/L (ref 0–37)
Albumin: 4.3 g/dL (ref 3.5–5.2)
Alkaline Phosphatase: 77 U/L (ref 39–117)
BUN: 18 mg/dL (ref 6–23)
CO2: 22 mEq/L (ref 19–32)
Calcium: 9.9 mg/dL (ref 8.4–10.5)
Chloride: 105 mEq/L (ref 96–112)
Creatinine, Ser: 0.71 mg/dL (ref 0.40–1.20)
GFR: 105.86 mL/min (ref >60.00)
Glucose, Bld: 111 mg/dL — ABNORMAL HIGH (ref 70–99)
Potassium: 4 mEq/L (ref 3.5–5.1)
Sodium: 138 mEq/L (ref 135–145)
Total Bilirubin: 0.5 mg/dL (ref 0.2–1.2)
Total Protein: 6.8 g/dL (ref 6.0–8.3)

## 2020-11-21 LAB — HEMOGLOBIN A1C: Hgb A1c MFr Bld: 6 % (ref 4.6–6.5)

## 2020-11-21 LAB — MICROALBUMIN / CREATININE URINE RATIO
Creatinine,U: 160.3 mg/dL
Microalb Creat Ratio: 2.4 mg/g (ref 0.0–30.0)
Microalb, Ur: 3.8 mg/dL — ABNORMAL HIGH (ref 0.0–1.9)

## 2020-11-21 LAB — T4, FREE: Free T4: 1.1 ng/dL (ref 0.60–1.60)

## 2020-11-21 LAB — LIPID PANEL
Cholesterol: 190 mg/dL (ref 0–200)
HDL: 37.2 mg/dL — ABNORMAL LOW (ref >39.00)
LDL Cholesterol: 125 mg/dL — ABNORMAL HIGH (ref 0–99)
NonHDL: 152.43
Total CHOL/HDL Ratio: 5
Triglycerides: 138 mg/dL (ref 0.0–149.0)
VLDL: 27.6 mg/dL (ref 0.0–40.0)

## 2020-11-21 LAB — TSH: TSH: 3.17 u[IU]/mL (ref 0.35–4.50)

## 2020-11-21 MED ORDER — VORTIOXETINE HBR 5 MG PO TABS
ORAL_TABLET | Freq: Every day | ORAL | 3 refills | Status: AC
  Filled 2020-11-21: qty 90, 90d supply, fill #0
  Filled 2021-03-19: qty 90, 90d supply, fill #1
  Filled 2021-06-24: qty 90, 90d supply, fill #2

## 2020-11-21 MED ORDER — LEVOTHYROXINE SODIUM 112 MCG PO TABS
ORAL_TABLET | Freq: Every day | ORAL | 3 refills | Status: DC
Start: 2020-11-21 — End: 2021-10-25
  Filled 2020-11-21: qty 90, 90d supply, fill #0
  Filled 2021-03-19: qty 90, 90d supply, fill #1
  Filled 2021-06-24: qty 90, 90d supply, fill #2
  Filled 2021-09-25: qty 90, 90d supply, fill #3

## 2020-11-21 MED ORDER — TRAZODONE HCL 50 MG PO TABS
ORAL_TABLET | Freq: Every day | ORAL | 3 refills | Status: DC
  Filled 2020-11-21: qty 90, 90d supply, fill #0
  Filled 2021-03-19: qty 90, 90d supply, fill #1
  Filled 2021-06-26: qty 90, 90d supply, fill #2
  Filled 2021-10-25: qty 90, 90d supply, fill #3

## 2020-11-21 MED ORDER — METRONIDAZOLE 0.75 % EX GEL
1.0000 "application " | Freq: Two times a day (BID) | CUTANEOUS | 3 refills | Status: DC
  Filled 2020-11-21: qty 45, 30d supply, fill #0

## 2020-11-21 NOTE — Patient Instructions (Signed)
Preventive Care 84-41 Years Old, Female Preventive care refers to lifestyle choices and visits with your health care provider that can promote health and wellness. This includes:  A yearly physical exam. This is also called an annual wellness visit.  Regular dental and eye exams.  Immunizations.  Screening for certain conditions.  Healthy lifestyle choices, such as: ? Eating a healthy diet. ? Getting regular exercise. ? Not using drugs or products that contain nicotine and tobacco. ? Limiting alcohol use. What can I expect for my preventive care visit? Physical exam Your health care provider will check your:  Height and weight. These may be used to calculate your BMI (body mass index). BMI is a measurement that tells if you are at a healthy weight.  Heart rate and blood pressure.  Body temperature.  Skin for abnormal spots. Counseling Your health care provider may ask you questions about your:  Past medical problems.  Family's medical history.  Alcohol, tobacco, and drug use.  Emotional well-being.  Home life and relationship well-being.  Sexual activity.  Diet, exercise, and sleep habits.  Work and work Statistician.  Access to firearms.  Method of birth control.  Menstrual cycle.  Pregnancy history. What immunizations do I need? Vaccines are usually given at various ages, according to a schedule. Your health care provider will recommend vaccines for you based on your age, medical history, and lifestyle or other factors, such as travel or where you work.   What tests do I need? Blood tests  Lipid and cholesterol levels. These may be checked every 5 years, or more often if you are over 3 years old.  Hepatitis C test.  Hepatitis B test. Screening  Lung cancer screening. You may have this screening every year starting at age 73 if you have a 30-pack-year history of smoking and currently smoke or have quit within the past 15 years.  Colorectal cancer  screening. ? All adults should have this screening starting at age 52 and continuing until age 17. ? Your health care provider may recommend screening at age 49 if you are at increased risk. ? You will have tests every 1-10 years, depending on your results and the type of screening test.  Diabetes screening. ? This is done by checking your blood sugar (glucose) after you have not eaten for a while (fasting). ? You may have this done every 1-3 years.  Mammogram. ? This may be done every 1-2 years. ? Talk with your health care provider about when you should start having regular mammograms. This may depend on whether you have a family history of breast cancer.  BRCA-related cancer screening. This may be done if you have a family history of breast, ovarian, tubal, or peritoneal cancers.  Pelvic exam and Pap test. ? This may be done every 3 years starting at age 10. ? Starting at age 11, this may be done every 5 years if you have a Pap test in combination with an HPV test. Other tests  STD (sexually transmitted disease) testing, if you are at risk.  Bone density scan. This is done to screen for osteoporosis. You may have this scan if you are at high risk for osteoporosis. Talk with your health care provider about your test results, treatment options, and if necessary, the need for more tests. Follow these instructions at home: Eating and drinking  Eat a diet that includes fresh fruits and vegetables, whole grains, lean protein, and low-fat dairy products.  Take vitamin and mineral supplements  as recommended by your health care provider.  Do not drink alcohol if: ? Your health care provider tells you not to drink. ? You are pregnant, may be pregnant, or are planning to become pregnant.  If you drink alcohol: ? Limit how much you have to 0-1 drink a day. ? Be aware of how much alcohol is in your drink. In the [place] one drink equals one 12 oz bottle of beer (355 mL), one 5 oz glass of  wine (148 mL), or one 1 oz glass of hard liquor (44 mL).   Lifestyle  Take daily care of your teeth and gums. Brush your teeth every morning and night with fluoride toothpaste. Floss one time each day.  Stay active. Exercise for at least 30 minutes 5 or more days each week.  Do not use any products that contain nicotine or tobacco, such as cigarettes, e-cigarettes, and chewing tobacco. If you need help quitting, ask your health care provider.  Do not use drugs.  If you are sexually active, practice safe sex. Use a condom or other form of protection to prevent STIs (sexually transmitted infections).  If you do not wish to become pregnant, use a form of birth control. If you plan to become pregnant, see your health care provider for a prepregnancy visit.  If told by your health care provider, take low-dose aspirin daily starting at age 50.  Find healthy ways to cope with stress, such as: ? Meditation, yoga, or listening to music. ? Journaling. ? Talking to a trusted person. ? Spending time with friends and family. Safety  Always wear your seat belt while driving or riding in a vehicle.  Do not drive: ? If you have been drinking alcohol. Do not ride with someone who has been drinking. ? When you are tired or distracted. ? While texting.  Wear a helmet and other protective equipment during sports activities.  If you have firearms in your house, make sure you follow all gun safety procedures. What's next?  Visit your health care provider once a year for an annual wellness visit.  Ask your health care provider how often you should have your eyes and teeth checked.  Stay up to date on all vaccines. This information is not intended to replace advice given to you by your health care provider. Make sure you discuss any questions you have with your health care provider. Document Revised: 04/10/2020 Document Reviewed: 03/18/2018 Elsevier Patient Education  2021 Elsevier Inc.  

## 2020-11-21 NOTE — Progress Notes (Signed)
Patient: Tammy Mooney MRN: [id number] DOB: 01-14-1981 PCP: Orma Flaming, MD     Subjective:  Chief Complaint  Patient presents with  . Annual Exam     HPI: The patient is a 40 y.o. female who presents today for annual exam. She denies any changes to past medical history. There have been no recent hospitalizations. They are following a well balanced diet and exercise plan. She does pilates 3-4 times weekly. Weight has been increasing steadily. Follow up on chronic medical conditions, Discuss labs and rosacea.   No family hx of breast or colon cancer.   Diabetes: Patient is here for follow up of type 2 diabetes. First diagnosed gestational diabetes.  Currently on the following medications-none, diet controlled.  Last A1C was 5.7. Currently exercising and following diabetic diet. Denies any hypoglycemic events. Denies any vision changes, nausea, vomiting, abdominal pain, ulcers/paraesthesia in feet, polyuria, polydipsia or polyphagia. Denies any chest pain, shortness of breath.   Hyperlipidemia Mild, LDL has been around 120. Family hx of MI with CAD and multiple stents in her father.  The 10-year ASCVD risk score Mikey Bussing DC Jr., et al., 2013) is: 1.4%  Hypothyroidism Currently on synthroid 152mcg. Takes as prescribed. Denies any symptoms. Euthyroid.   Depression Currently on trintellix 5mg /day.  Really happy with this.   Rosacea Has tried metrogel in the past. Also saw dermatology, not keen to see them right now.     Immunization History  Administered Date(s) Administered  . Influenza,inj,Quad PF,6+ Mos 04/12/2018  . Influenza-Unspecified 05/14/2017, 04/21/2019   Colonoscopy: routine screening  Mammogram: never had one.  Pap smear: 07/10/2022   Review of Systems  Constitutional: Negative for chills, fatigue and fever.  HENT: Negative for dental problem, ear pain, hearing loss and trouble swallowing.   Eyes: Negative for visual disturbance.  Respiratory: Negative  for cough, chest tightness and shortness of breath.   Cardiovascular: Negative for chest pain, palpitations and leg swelling.  Gastrointestinal: Negative for abdominal pain, blood in stool, diarrhea and nausea.  Endocrine: Negative for cold intolerance, polydipsia, polyphagia and polyuria.  Genitourinary: Negative for dysuria and hematuria.  Musculoskeletal: Negative for arthralgias.  Skin: Negative for rash.  Neurological: Negative for dizziness and headaches.  Psychiatric/Behavioral: Negative for dysphoric mood and sleep disturbance. The patient is not nervous/anxious.     Allergies Patient is allergic to adhesive [tape] and penicillins.  Past Medical History Patient  has a past medical history of Anxiety, Coronavirus infection, DM (diabetes mellitus), type 2 ([place]), Hypothyroidism, Infertility, female, Low HDL (under 40), PCOS (polycystic ovarian syndrome), Varicella, and Vertigo.  Surgical History Patient  has a past surgical history that includes Cesarean section; Tonsillectomy; Eye surgery; Nasal sinus surgery; Cesarean section (07/01/2012); Cesarean section (N/A, 10/25/2014); Mandible surgery (AGE 89); Image guided sinus surgery (N/A, 06/05/2016); Turbinate reduction (Bilateral, 06/05/2016); Sinusotomy (Left, 06/05/2016); Maxillary antrostomy (Right, 06/05/2016); Hardware Removal (Left, 06/05/2016); and Laparoscopic gastric sleeve resection (N/A, 04/05/2019).  Family History Pateint's family history includes Depression in her mother; Diabetes in her brother, mother, and paternal grandmother; Heart attack in her father; Heart disease in her father and maternal grandfather; Hyperlipidemia in her father; Hypertension in her father; Obesity in her mother; Sleep apnea in her mother.  Social History Patient  reports that she has never smoked. She has never used smokeless tobacco. She reports that she does not drink alcohol and does not use drugs.    Objective: Vitals:   11/21/20 0800  BP:  112/70  Pulse: 84  Temp: (!) 97.5 F (  36.4 C)  TempSrc: Temporal  SpO2: 97%  Weight: 242 lb 12.8 oz (110.1 kg)  Height: 5\' 6"  (1.676 m)    Body mass index is 39.19 kg/m.  Physical Exam Vitals reviewed.  Constitutional:      Appearance: Normal appearance. She is well-developed. She is obese.  HENT:     Head: Normocephalic and atraumatic.     Right Ear: Tympanic membrane, ear canal and external ear normal.     Left Ear: Tympanic membrane, ear canal and external ear normal.     Mouth/Throat:     Mouth: Mucous membranes are moist.  Eyes:     Extraocular Movements: Extraocular movements intact.     Conjunctiva/sclera: Conjunctivae normal.     Pupils: Pupils are equal, round, and reactive to light.  Neck:     Thyroid: No thyromegaly.     Vascular: No carotid bruit.  Cardiovascular:     Rate and Rhythm: Normal rate and regular rhythm.     Pulses: Normal pulses.     Heart sounds: Normal heart sounds. No murmur heard.   Pulmonary:     Effort: Pulmonary effort is normal.     Breath sounds: Normal breath sounds.  Abdominal:     General: Abdomen is flat. Bowel sounds are normal. There is no distension.     Palpations: Abdomen is soft.     Tenderness: There is no abdominal tenderness.  Musculoskeletal:     Cervical back: Normal range of motion and neck supple.  Lymphadenopathy:     Cervical: No cervical adenopathy.  Skin:    General: Skin is warm and dry.     Capillary Refill: Capillary refill takes less than 2 seconds.     Findings: Erythema (over bilateral cheeks, forehead. no papules or pustules. ) present. No rash.  Neurological:     General: No focal deficit present.     Mental Status: She is alert and oriented to person, place, and time.     Cranial Nerves: No cranial nerve deficit.     Coordination: Coordination normal.     Deep Tendon Reflexes: Reflexes normal.  Psychiatric:        Mood and Affect: Mood normal.        Behavior: Behavior normal.         [place] Office Visit from 11/21/2020 in [place]  PHQ-2 Total Score 0       Assessment/plan: 1. Annual physical exam Routine fasting labs today. HM reviewed and discussed she needs a mmg and can set this up, otherwise utd. Really work on more cardio, but very good with her pilates classes. Overall doing well. Fu 1 year annual.  Patient counseling [x]    Nutrition: Stressed importance of moderation in sodium/caffeine intake, saturated fat and cholesterol, caloric balance, sufficient intake of fresh fruits, vegetables, fiber, calcium, iron, and 1 mg of folate supplement per day (for females capable of pregnancy).  [x]    Stressed the importance of regular exercise.   []    Substance Abuse: Discussed cessation/primary prevention of tobacco, alcohol, or other drug use; driving or other dangerous activities under the influence; availability of treatment for abuse.   [x]    Injury prevention: Discussed safety belts, safety helmets, smoke detector, smoking near bedding or upholstery.   [x]    Sexuality: Discussed sexually transmitted diseases, partner selection, use of condoms, avoidance of unintended pregnancy  and contraceptive alternatives.  [x]    Dental health: Discussed importance of regular tooth brushing, flossing, and dental visits.  [  x]   Health maintenance and immunizations reviewed. Please refer to Health maintenance section.    - CBC with Differential/Platelet - Comprehensive metabolic panel  2. Primary hypothyroidism  - TSH - T4, free - levothyroxine (SYNTHROID) 112 MCG tablet; TAKE 1 TABLET BY MOUTH ONCE DAILY  Dispense: 90 tablet; Refill: 3  3. Type 2 diabetes mellitus without complication, without long-term current use of insulin ([place]) Has been well controlled on diet alone. Will check labs today.  - Hemoglobin A1c - Microalbumin / creatinine urine ratio  4. Dyslipidemia associated with type 2 diabetes mellitus ([place])  - Lipid panel  5. Vitamin  D deficiency   6. Other insomnia  - traZODone (DESYREL) 50 MG tablet; TAKE 1 TABLET BY MOUTH AT BEDTIME.  Dispense: 90 tablet; Refill: 3  7. S/P laparoscopic sleeve gastrectomy  - Vitamin B12; Future - Iron, TIBC and Ferritin Panel; Future  8. Rosacea metrogel sent back in. I do wonder if trial topical ivermectin would be worth trying to see if better results. Will discuss with her.   9. depression Well controlled on her trintellix. Really likes this. F/u in 6 months.    Return in about 6 months (around 05/24/2021) for depression/diabetes with dr. Jonni Sanger .     Orma Flaming, MD [place]  11/21/2020

## 2020-11-22 ENCOUNTER — Other Ambulatory Visit (INDEPENDENT_AMBULATORY_CARE_PROVIDER_SITE_OTHER): Payer: 59

## 2020-11-22 ENCOUNTER — Other Ambulatory Visit (HOSPITAL_COMMUNITY): Payer: Self-pay

## 2020-11-22 DIAGNOSIS — Z9884 Bariatric surgery status: Secondary | ICD-10-CM | POA: Diagnosis not present

## 2020-11-22 LAB — VITAMIN B12: Vitamin B-12: 277 pg/mL (ref 211–911)

## 2020-11-23 LAB — IRON,TIBC AND FERRITIN PANEL
%SAT: 21 % (calc) (ref 16–45)
Ferritin: 196 ng/mL (ref 16–232)
Iron: 67 ug/dL (ref 40–190)
TIBC: 325 mcg/dL (calc) (ref 250–450)

## 2021-03-19 ENCOUNTER — Other Ambulatory Visit (HOSPITAL_COMMUNITY): Payer: Self-pay

## 2021-03-20 ENCOUNTER — Other Ambulatory Visit (HOSPITAL_COMMUNITY): Payer: Self-pay

## 2021-05-08 ENCOUNTER — Other Ambulatory Visit: Payer: Self-pay

## 2021-05-08 ENCOUNTER — Other Ambulatory Visit (HOSPITAL_BASED_OUTPATIENT_CLINIC_OR_DEPARTMENT_OTHER): Payer: Self-pay

## 2021-05-08 ENCOUNTER — Encounter: Payer: Self-pay | Admitting: Family Medicine

## 2021-05-08 ENCOUNTER — Ambulatory Visit: Payer: 59 | Admitting: Family Medicine

## 2021-05-08 VITALS — BP 110/68 | HR 80 | Temp 98.4°F | Ht 66.0 in | Wt 245.2 lb

## 2021-05-08 DIAGNOSIS — Z975 Presence of (intrauterine) contraceptive device: Secondary | ICD-10-CM | POA: Diagnosis not present

## 2021-05-08 DIAGNOSIS — E039 Hypothyroidism, unspecified: Secondary | ICD-10-CM | POA: Diagnosis not present

## 2021-05-08 DIAGNOSIS — E119 Type 2 diabetes mellitus without complications: Secondary | ICD-10-CM | POA: Diagnosis not present

## 2021-05-08 DIAGNOSIS — E049 Nontoxic goiter, unspecified: Secondary | ICD-10-CM | POA: Diagnosis not present

## 2021-05-08 DIAGNOSIS — E786 Lipoprotein deficiency: Secondary | ICD-10-CM | POA: Diagnosis not present

## 2021-05-08 DIAGNOSIS — E282 Polycystic ovarian syndrome: Secondary | ICD-10-CM | POA: Diagnosis not present

## 2021-05-08 DIAGNOSIS — Z1231 Encounter for screening mammogram for malignant neoplasm of breast: Secondary | ICD-10-CM

## 2021-05-08 DIAGNOSIS — R7303 Prediabetes: Secondary | ICD-10-CM | POA: Insufficient documentation

## 2021-05-08 DIAGNOSIS — E538 Deficiency of other specified B group vitamins: Secondary | ICD-10-CM

## 2021-05-08 LAB — POCT GLYCOSYLATED HEMOGLOBIN (HGB A1C): Hemoglobin A1C: 5.6 % (ref 4.0–5.6)

## 2021-05-08 MED ORDER — VITAMIN B-12 1000 MCG PO TABS: 1000.0000 ug | ORAL_TABLET | Freq: Every day | ORAL | Status: AC

## 2021-05-08 MED ORDER — NYSTATIN-TRIAMCINOLONE 100000-0.1 UNIT/GM-% EX CREA
1.0000 "application " | TOPICAL_CREAM | Freq: Two times a day (BID) | CUTANEOUS | 1 refills | Status: DC
  Filled 2021-05-08: qty 30, 7d supply, fill #0

## 2021-05-08 NOTE — Patient Instructions (Signed)
Please return in May for your complete physcial; come fasting for that appointment.   Please message me when you are ready to remove and replace your Mirena IUD.   If you have any questions or concerns, please don't hesitate to send me a message via MyChart or call the office at [phone]. Thank you for visiting with [name] today! It's our pleasure caring for you.   I have ordered a mammogram and/or bone density for you as we discussed today: [x]   Mammogram  []   Bone Density  Please call the office checked below to schedule your appointment:  [x]   [facility]      [address] [place], [place]         []   [facility]  [address] [place], [place]

## 2021-05-08 NOTE — Progress Notes (Signed)
Subjective  CC:  Chief Complaint  Patient presents with   Transitions Of Care   Diabetes   Hypothyroidism   Depression   Anxiety   I reviewed records. Last cpe 11/2020 HPI: Tammy Mooney is a 40 y.o. female who presents to the office today for follow up of diabetes and problems listed above in the chief complaint.  Diabetes follow up: actually has IFG, PCOS. Diet controlled. S/p gastric sleeve surgery 2020. Has used metformin in past. Didn't help much and had diarrhea. Tries to eat well. Sometimes too much processed food. Not on statin nor pneumococcal imms.  PCOS Low thyroid at goal by labs and feels well Rosacea: to see derm tomorrow Chronic depression controlled on trintellix IUD; mirena; amenorrheic. 6 years come December. Would like replacement when due. No GYN 3 children. Infertility when she was younger.  HM: mammogram due  Vit b12 def and at risk for iron deficiency and low D due to gastric surgery. Take supplements.   Wt Readings from Last 3 Encounters:  05/08/21 245 lb 3.2 oz (111.2 kg)  11/21/20 242 lb 12.8 oz (110.1 kg)  09/26/20 243 lb 12.8 oz (110.6 kg)    BP Readings from Last 3 Encounters:  05/08/21 110/68  11/21/20 112/70  09/26/20 110/70    Assessment  1. Primary hypothyroidism   2. Diet-controlled diabetes mellitus ([place])   3. Vitamin B12 deficiency   4. IUD (intrauterine device) in place   5. Prediabetes   6. PCOS (polycystic ovarian syndrome)   7. Encounter for screening mammogram for breast cancer   8. Enlarged thyroid   9. Low HDL (under 40)      Plan  IFG is currently very well controlled. Continue dietary mgt.  Thyroid levels are at goal.  Start b12 1061mcg daily in addition to d and iron PCOS and IUD. Will replace next year. Due by 06/2022 Pt to shcedule mammo ? Goiter. Check ultrasound. R/o nodules.   Follow up: No follow-ups on file.. Orders Placed This Encounter  Procedures   MM DIGITAL SCREENING BILATERAL   US THYROID    POCT HgB A1C   Meds ordered this encounter  Medications   nystatin-triamcinolone (MYCOLOG II) cream    Sig: Apply 1 application topically 2 (two) times daily. For 1-2 weeks, then as needed    Dispense:  60 g    Refill:  1   vitamin B-12 (CYANOCOBALAMIN) 1000 MCG tablet    Sig: Take 1 tablet (1,000 mcg total) by mouth daily.      Immunization History  Administered Date(s) Administered   Influenza,inj,Quad PF,6+ Mos 04/12/2018   Influenza-Unspecified 05/14/2017, 04/21/2019, 05/07/2021    Diabetes Related Lab Review: Lab Results  Component Value Date   HGBA1C 5.6 05/08/2021   HGBA1C 6.0 11/21/2020   HGBA1C 5.7 02/01/2020    Lab Results  Component Value Date   MICROALBUR 3.8 (H) 11/21/2020   Lab Results  Component Value Date   CREATININE 0.71 11/21/2020   BUN 18 11/21/2020   NA 138 11/21/2020   K 4.0 11/21/2020   CL 105 11/21/2020   CO2 22 11/21/2020   Lab Results  Component Value Date   CHOL 190 11/21/2020   CHOL 175 02/01/2020   CHOL 165 12/24/2017   Lab Results  Component Value Date   HDL 37.20 (L) 11/21/2020   HDL 37.80 (L) 02/01/2020   HDL 34 (L) 12/24/2017   Lab Results  Component Value Date   LDLCALC 125 (H) 11/21/2020  LDLCALC 120 (H) 02/01/2020   LDLCALC 108 (H) 12/24/2017   Lab Results  Component Value Date   TRIG 138.0 11/21/2020   TRIG 83.0 02/01/2020   TRIG 114 12/24/2017   Lab Results  Component Value Date   CHOLHDL 5 11/21/2020   CHOLHDL 5 02/01/2020   CHOLHDL 5 05/20/2013   No results found for: LDLDIRECT The 10-year ASCVD risk score (Arnett DK, et al., 2019) is: 1.7%   Values used to calculate the score:     Age: 29 years     Sex: Female     Is Non-Hispanic African American: No     Diabetic: Yes     Tobacco smoker: No     Systolic Blood Pressure: 093 mmHg     Is BP treated: No     HDL Cholesterol: 37.2 mg/dL     Total Cholesterol: 190 mg/dL I have reviewed the PMH, Fam and Soc history. Patient Active Problem List    Diagnosis Date Noted   Prediabetes 05/08/2021    Priority: High   Major depression, recurrent, chronic ([place]) 11/21/2020    Priority: High   S/P laparoscopic sleeve gastrectomy 04/05/2019    Priority: High   Morbid obesity ([place]) 02/09/2017    Priority: High   Primary hypothyroidism 11/13/2015    Priority: High   IUD (intrauterine device) in place 05/08/2021    Priority: Medium    Chronic pain of right knee 04/22/2018    Priority: Medium     Previous evaluation by Orthopedics. MRI available.    Primary insomnia 09/17/2017    Priority: Medium    Dyslipidemia associated with type 2 diabetes mellitus ([place]) 02/23/2017    Priority: Medium          Anxiety 11/13/2015    Priority: Medium     Mild. Patient takes Buspar as needed.    PCOS (polycystic ovarian syndrome) 09/25/2011    Priority: Medium    Vitamin B12 deficiency 05/08/2021    Priority: Low   Rosacea 11/21/2020    Priority: Low   Vitamin D deficiency 02/23/2017    Priority: Low    repleted 07/2019    Allergic rhinitis 01/03/2016    Priority: Low    Controlled with Zyrtec.    Low HDL (under 40) 05/08/2021   Enlarged thyroid 05/08/2021    Social History: Patient  reports that she has never smoked. She has never used smokeless tobacco. She reports that she does not drink alcohol and does not use drugs.  Review of Systems: Ophthalmic: negative for eye pain, loss of vision or double vision Cardiovascular: negative for chest pain Respiratory: negative for SOB or persistent cough Gastrointestinal: negative for abdominal pain Genitourinary: negative for dysuria or gross hematuria MSK: negative for foot lesions Neurologic: negative for weakness or gait disturbance  Objective  Vitals: BP 110/68   Pulse 80   Temp 98.4 F (36.9 C) (Temporal)   Ht 5\' 6"  (1.676 m)   Wt 245 lb 3.2 oz (111.2 kg)   SpO2 96%   Breastfeeding No   BMI 39.58 kg/m  General: well appearing, no acute distress  Psych:  Alert and oriented,  normal mood and affect HEENT:  Normocephalic, atraumatic, moist mucous membranes, supple neck , enlarged thyroid L>R, no nodules or ttp Cardiovascular:  Nl S1 and S2, RRR without murmur, gallop or rub. no edema Respiratory:  Good breath sounds bilaterally, CTAB with normal effort, no rales Skin: erythematous cheeks with acniform comedones.    Diabetic education: ongoing education  regarding chronic disease management for diabetes was given today. We continue to reinforce the ABC's of diabetic management: A1c (<7 or 8 dependent upon patient), tight blood pressure control, and cholesterol management with goal LDL < 100 minimally. We discuss diet strategies, exercise recommendations, medication options and possible side effects. At each visit, we review recommended immunizations and preventive care recommendations for diabetics and stress that good diabetic control can prevent other problems. See below for this patient's data.   Commons side effects, risks, benefits, and alternatives for medications and treatment plan prescribed today were discussed, and the patient expressed understanding of the given instructions. Patient is instructed to call or message via MyChart if he/she has any questions or concerns regarding our treatment plan. No barriers to understanding were identified. We discussed Red Flag symptoms and signs in detail. Patient expressed understanding regarding what to do in case of urgent or emergency type symptoms.  Medication list was reconciled, printed and provided to the patient in AVS. Patient instructions and summary information was reviewed with the patient as documented in the AVS. This note was prepared with assistance of Dragon voice recognition software. Occasional wrong-word or sound-a-like substitutions may have occurred due to the inherent limitations of voice recognition software  This visit occurred during the SARS-CoV-2 public health emergency.  Safety protocols were in  place, including screening questions prior to the visit, additional usage of staff PPE, and extensive cleaning of exam room while observing appropriate contact time as indicated for disinfecting solutions.

## 2021-05-09 DIAGNOSIS — Z1283 Encounter for screening for malignant neoplasm of skin: Secondary | ICD-10-CM | POA: Diagnosis not present

## 2021-05-09 DIAGNOSIS — L718 Other rosacea: Secondary | ICD-10-CM | POA: Diagnosis not present

## 2021-05-09 DIAGNOSIS — L821 Other seborrheic keratosis: Secondary | ICD-10-CM | POA: Diagnosis not present

## 2021-05-20 ENCOUNTER — Ambulatory Visit
Admission: RE | Admit: 2021-05-20 | Discharge: 2021-05-20 | Disposition: A | Discharge status: Home / Self Care | Payer: 59 | Source: Ambulatory Visit | Attending: Family Medicine | Admitting: Family Medicine

## 2021-05-20 DIAGNOSIS — E039 Hypothyroidism, unspecified: Secondary | ICD-10-CM

## 2021-05-20 DIAGNOSIS — E041 Nontoxic single thyroid nodule: Secondary | ICD-10-CM | POA: Diagnosis not present

## 2021-05-20 DIAGNOSIS — E049 Nontoxic goiter, unspecified: Secondary | ICD-10-CM

## 2021-05-23 ENCOUNTER — Ambulatory Visit
Admission: RE | Admit: 2021-05-23 | Discharge: 2021-05-23 | Disposition: A | Discharge status: Home / Self Care | Payer: 59 | Source: Ambulatory Visit | Attending: Family Medicine | Admitting: Family Medicine

## 2021-05-23 ENCOUNTER — Other Ambulatory Visit: Payer: Self-pay

## 2021-05-23 DIAGNOSIS — Z1231 Encounter for screening mammogram for malignant neoplasm of breast: Secondary | ICD-10-CM

## 2021-05-24 ENCOUNTER — Other Ambulatory Visit: Payer: Self-pay | Admitting: Family Medicine

## 2021-05-24 DIAGNOSIS — R928 Other abnormal and inconclusive findings on diagnostic imaging of breast: Secondary | ICD-10-CM

## 2021-06-03 DIAGNOSIS — M9902 Segmental and somatic dysfunction of thoracic region: Secondary | ICD-10-CM | POA: Diagnosis not present

## 2021-06-03 DIAGNOSIS — M9901 Segmental and somatic dysfunction of cervical region: Secondary | ICD-10-CM | POA: Diagnosis not present

## 2021-06-03 DIAGNOSIS — M5032 Other cervical disc degeneration, mid-cervical region, unspecified level: Secondary | ICD-10-CM | POA: Diagnosis not present

## 2021-06-03 DIAGNOSIS — M542 Cervicalgia: Secondary | ICD-10-CM | POA: Diagnosis not present

## 2021-06-05 DIAGNOSIS — M542 Cervicalgia: Secondary | ICD-10-CM | POA: Diagnosis not present

## 2021-06-05 DIAGNOSIS — M5032 Other cervical disc degeneration, mid-cervical region, unspecified level: Secondary | ICD-10-CM | POA: Diagnosis not present

## 2021-06-05 DIAGNOSIS — M9901 Segmental and somatic dysfunction of cervical region: Secondary | ICD-10-CM | POA: Diagnosis not present

## 2021-06-05 DIAGNOSIS — M9902 Segmental and somatic dysfunction of thoracic region: Secondary | ICD-10-CM | POA: Diagnosis not present

## 2021-06-07 DIAGNOSIS — M5032 Other cervical disc degeneration, mid-cervical region, unspecified level: Secondary | ICD-10-CM | POA: Diagnosis not present

## 2021-06-07 DIAGNOSIS — M9901 Segmental and somatic dysfunction of cervical region: Secondary | ICD-10-CM | POA: Diagnosis not present

## 2021-06-07 DIAGNOSIS — M542 Cervicalgia: Secondary | ICD-10-CM | POA: Diagnosis not present

## 2021-06-07 DIAGNOSIS — M9902 Segmental and somatic dysfunction of thoracic region: Secondary | ICD-10-CM | POA: Diagnosis not present

## 2021-06-11 DIAGNOSIS — M5032 Other cervical disc degeneration, mid-cervical region, unspecified level: Secondary | ICD-10-CM | POA: Diagnosis not present

## 2021-06-11 DIAGNOSIS — M9902 Segmental and somatic dysfunction of thoracic region: Secondary | ICD-10-CM | POA: Diagnosis not present

## 2021-06-11 DIAGNOSIS — M542 Cervicalgia: Secondary | ICD-10-CM | POA: Diagnosis not present

## 2021-06-11 DIAGNOSIS — M9901 Segmental and somatic dysfunction of cervical region: Secondary | ICD-10-CM | POA: Diagnosis not present

## 2021-06-18 ENCOUNTER — Ambulatory Visit: Payer: 59

## 2021-06-18 ENCOUNTER — Other Ambulatory Visit: Payer: Self-pay

## 2021-06-18 ENCOUNTER — Ambulatory Visit
Admission: RE | Admit: 2021-06-18 | Discharge: 2021-06-18 | Disposition: A | Discharge status: Home / Self Care | Payer: 59 | Source: Ambulatory Visit | Attending: Family Medicine | Admitting: Family Medicine

## 2021-06-18 ENCOUNTER — Other Ambulatory Visit: Payer: Self-pay | Admitting: Family Medicine

## 2021-06-18 DIAGNOSIS — R921 Mammographic calcification found on diagnostic imaging of breast: Secondary | ICD-10-CM

## 2021-06-18 DIAGNOSIS — M542 Cervicalgia: Secondary | ICD-10-CM | POA: Diagnosis not present

## 2021-06-18 DIAGNOSIS — M5032 Other cervical disc degeneration, mid-cervical region, unspecified level: Secondary | ICD-10-CM | POA: Diagnosis not present

## 2021-06-18 DIAGNOSIS — R928 Other abnormal and inconclusive findings on diagnostic imaging of breast: Secondary | ICD-10-CM

## 2021-06-18 DIAGNOSIS — M9901 Segmental and somatic dysfunction of cervical region: Secondary | ICD-10-CM | POA: Diagnosis not present

## 2021-06-18 DIAGNOSIS — N6002 Solitary cyst of left breast: Secondary | ICD-10-CM | POA: Diagnosis not present

## 2021-06-18 DIAGNOSIS — M9902 Segmental and somatic dysfunction of thoracic region: Secondary | ICD-10-CM | POA: Diagnosis not present

## 2021-06-24 ENCOUNTER — Other Ambulatory Visit (HOSPITAL_COMMUNITY): Payer: Self-pay

## 2021-06-25 ENCOUNTER — Other Ambulatory Visit (HOSPITAL_COMMUNITY): Payer: Self-pay

## 2021-06-26 ENCOUNTER — Other Ambulatory Visit (HOSPITAL_COMMUNITY): Payer: Self-pay

## 2021-07-04 DIAGNOSIS — M5032 Other cervical disc degeneration, mid-cervical region, unspecified level: Secondary | ICD-10-CM | POA: Diagnosis not present

## 2021-07-04 DIAGNOSIS — M542 Cervicalgia: Secondary | ICD-10-CM | POA: Diagnosis not present

## 2021-07-04 DIAGNOSIS — M9902 Segmental and somatic dysfunction of thoracic region: Secondary | ICD-10-CM | POA: Diagnosis not present

## 2021-07-04 DIAGNOSIS — M9901 Segmental and somatic dysfunction of cervical region: Secondary | ICD-10-CM | POA: Diagnosis not present

## 2021-07-15 DIAGNOSIS — Z760 Encounter for issue of repeat prescription: Secondary | ICD-10-CM | POA: Diagnosis not present

## 2021-07-18 ENCOUNTER — Other Ambulatory Visit: Payer: Self-pay

## 2021-07-18 ENCOUNTER — Encounter (INDEPENDENT_AMBULATORY_CARE_PROVIDER_SITE_OTHER): Payer: Self-pay | Admitting: Family Medicine

## 2021-07-18 ENCOUNTER — Ambulatory Visit (INDEPENDENT_AMBULATORY_CARE_PROVIDER_SITE_OTHER): Payer: 59 | Admitting: Family Medicine

## 2021-07-18 VITALS — BP 107/71 | HR 69 | Temp 97.6°F | Ht 67.0 in | Wt 245.0 lb

## 2021-07-18 DIAGNOSIS — R0602 Shortness of breath: Secondary | ICD-10-CM | POA: Diagnosis not present

## 2021-07-18 DIAGNOSIS — E282 Polycystic ovarian syndrome: Secondary | ICD-10-CM | POA: Diagnosis not present

## 2021-07-18 DIAGNOSIS — F339 Major depressive disorder, recurrent, unspecified: Secondary | ICD-10-CM | POA: Diagnosis not present

## 2021-07-18 DIAGNOSIS — E538 Deficiency of other specified B group vitamins: Secondary | ICD-10-CM

## 2021-07-18 DIAGNOSIS — E119 Type 2 diabetes mellitus without complications: Secondary | ICD-10-CM

## 2021-07-18 DIAGNOSIS — E039 Hypothyroidism, unspecified: Secondary | ICD-10-CM

## 2021-07-18 DIAGNOSIS — Z903 Acquired absence of stomach [part of]: Secondary | ICD-10-CM

## 2021-07-18 DIAGNOSIS — E669 Obesity, unspecified: Secondary | ICD-10-CM

## 2021-07-18 DIAGNOSIS — E559 Vitamin D deficiency, unspecified: Secondary | ICD-10-CM

## 2021-07-18 DIAGNOSIS — Z6838 Body mass index (BMI) 38.0-38.9, adult: Secondary | ICD-10-CM

## 2021-07-18 NOTE — Progress Notes (Signed)
Chief Complaint:   OBESITY Tammy Mooney is here to discuss her progress with her obesity treatment plan along with follow-up of her obesity related diagnoses. See Medical Weight Management Flowsheet for complete bioelectrical impedance results.  Today's visit was #: 1 Starting weight: 245 lbs Starting date: 07/18/2021  Interim History: Tammy Mooney's last visit was on 02/23/2018.  In September 2020, she had VSG.  Her high weight was 270, and her low weight was 235.  She says she is taking a daily multivitamin and recently had labs with her PCP.  She endorses less food noise and says she is doing more exercise (pilates 3 times per week).  She has history of taking Trulicity and phentermine (increased focus).  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  Tammy Mooney provided the following food recall today:  She says she eats out too much. Still doing protein drinks in the morning. Water. Dave KB - 2 slices with butter (thin). Crystal Lite with collagen. Bag salad with chicken (2/3) for dinner 4-5 with family. Increased hunger around 7 so will have a snack/leftovers.  Assessment/Plan:   1. SOB (shortness of breath) on exertion Tammy Mooney will have an IC today.  2. Primary hypothyroidism Course: Stable. Medication: levothyroxine 112 mcg daily.   Plan: Patient was instructed not to take MVM or iron within 4 hours of taking thyroid medications.  We will continue to monitor alongside Endocrinology/PCP as it relates to her weight loss journey.   Lab Results  Component Value Date   TSH 3.17 11/21/2020   3. Vitamin B12 deficiency Lab Results  Component Value Date   VITAMINB12 277 11/22/2020   Supplementation:  Vitamin B12 1,000 mcg daily.    Plan:  Continue current treatment.   4. PCOS (polycystic ovarian syndrome) Medication: None.  Plan: She will continue to focus on protein-rich, low simple carbohydrate foods. We reviewed the importance of hydration, regular exercise for stress reduction,  and restorative sleep.   Counseling PCOS is a leading cause of menstrual irregularities and infertility. It is also associated with obesity, hirsutism (excessive hair growth on the face, chest, or back), and cardiovascular risk factors such as high cholesterol and insulin resistance. Insulin resistance appears to play a central role.  Women with PCOS have been shown to have impaired appetite-regulating hormones. Women with polycystic ovary syndrome (PCOS) have an increased risk for cardiovascular disease (CVD) - European Journal of Preventive Cardiology.  5. Type 2 diabetes mellitus without complication, without long-term current use of insulin ([place]) Diabetes Mellitus: At goal. Medication: None. Issues reviewed: blood sugar goals, complications of diabetes mellitus, hypoglycemia prevention and treatment, exercise, and nutrition.  Plan: The patient will continue to focus on protein-rich, low simple carbohydrate foods. We reviewed the importance of hydration, regular exercise for stress reduction, and restorative sleep.   Lab Results  Component Value Date   HGBA1C 5.6 05/08/2021   HGBA1C 6.0 11/21/2020   HGBA1C 5.7 02/01/2020   Lab Results  Component Value Date   MICROALBUR 3.8 (H) 11/21/2020   LDLCALC 125 (H) 11/21/2020   CREATININE 0.71 11/21/2020   6. Vitamin D deficiency At goal. She is taking OTC vitamin D 1,000 IU daily.  Plan: Continue current OTC vitamin D supplementation.  Follow-up for routine testing of Vitamin D, at least 2-3 times per year to avoid over-replacement.  Lab Results  Component Value Date   VD25OH 72.32 02/01/2020   VD25OH 24.51 (L) 07/27/2019   VD25OH 37.5 12/24/2017   7. History of sleeve gastrectomy  Tammy Mooney is at risk for malnutrition due to her previous bariatric surgery.   Counseling You may need to eat 3 meals and 2 snacks, or 5 small meals each day in order to reach your protein and calorie goals.  Allow at least 15 minutes for each meal so that  you can eat mindfully. Listen to your body so that you do not overeat. For most people, your sleeve or pouch will comfortably hold 4-6 ounces. Eat foods from all food groups. This includes fruits and vegetables, grains, dairy, and meat and other proteins. Include a protein-rich food at every meal and snack, and eat the protein food first.  You should be taking a Bariatric Multivitamin as well as calcium.   8. Depression, recurrent ([place]) Tammy Mooney is taking Trintellix 5 mg daily.  PHQ-9 is 13.  She denies SI/HI.  Plan:  Continue Trintellix.  Behavior modification techniques were discussed today to help Tammy Mooney deal with her emotional/non-hunger eating behaviors.    9. Class 2 severe obesity with serious comorbidity and body mass index (BMI) of 38.0 to 38.9 in adult, unspecified obesity type ([place])  Course: Tammy Mooney is currently in the action stage of change. As such, her goal is to continue with weight loss efforts.   Nutrition goals: She has agreed to the Category 2 Plan.   Exercise goals: For substantial health benefits, adults should do at least 150 minutes (2 hours and 30 minutes) a week of moderate-intensity, or 75 minutes (1 hour and 15 minutes) a week of vigorous-intensity aerobic physical activity, or an equivalent combination of moderate- and vigorous-intensity aerobic activity. Aerobic activity should be performed in episodes of at least 10 minutes, and preferably, it should be spread throughout the week.  Behavioral modification strategies: increasing lean protein intake, decreasing simple carbohydrates, increasing vegetables, and increasing water intake.  Tammy Mooney has agreed to follow-up with our clinic in 2-3 weeks. She was informed of the importance of frequent follow-up visits to maximize her success with intensive lifestyle modifications for her multiple health conditions.   Objective:   Blood pressure 107/71, pulse 69, temperature 97.6 F (36.4 C), temperature source Oral, height  5\' 7"  (1.702 m), weight 245 lb (111.1 kg), SpO2 99 %. Body mass index is 38.37 kg/m.  General: Cooperative, alert, well developed, in no acute distress. HEENT: Conjunctivae and lids unremarkable. Cardiovascular: Regular rhythm.  Lungs: Normal work of breathing. Neurologic: No focal deficits.   Lab Results  Component Value Date   CREATININE 0.71 11/21/2020   BUN 18 11/21/2020   NA 138 11/21/2020   K 4.0 11/21/2020   CL 105 11/21/2020   CO2 22 11/21/2020   Lab Results  Component Value Date   ALT 17 11/21/2020   AST 13 11/21/2020   ALKPHOS 77 11/21/2020   BILITOT 0.5 11/21/2020   Lab Results  Component Value Date   HGBA1C 5.6 05/08/2021   HGBA1C 6.0 11/21/2020   HGBA1C 5.7 02/01/2020   HGBA1C 5.6 07/27/2019   HGBA1C 6.3 03/02/2019   Lab Results  Component Value Date   INSULIN 79.9 (H) 12/24/2017   INSULIN 40.1 (H) 09/03/2017   INSULIN 39.3 (H) 05/12/2017   INSULIN 42.1 (H) 02/09/2017   Lab Results  Component Value Date   TSH 3.17 11/21/2020   Lab Results  Component Value Date   CHOL 190 11/21/2020   HDL 37.20 (L) 11/21/2020   LDLCALC 125 (H) 11/21/2020   TRIG 138.0 11/21/2020   CHOLHDL 5 11/21/2020   Lab Results  Component Value Date  VD25OH 72.32 02/01/2020   VD25OH 24.51 (L) 07/27/2019   VD25OH 37.5 12/24/2017   Lab Results  Component Value Date   WBC 9.9 11/21/2020   HGB 14.4 11/21/2020   HCT 42.2 11/21/2020   MCV 87.2 11/21/2020   PLT 367.0 11/21/2020   Lab Results  Component Value Date   IRON 67 11/22/2020   TIBC 325 11/22/2020   FERRITIN 196 11/22/2020   Attestation Statements:   Reviewed by clinician on day of visit: allergies, medications, problem list, medical history, surgical history, family history, social history, and previous encounter notes.  Time spent on visit including pre-visit chart review and post-visit care and documentation was 45 minutes.Time was spent on: During the visit, I independently reviewed the patient's EKG,  bioimpedance scale results, and indirect calorimeter results. Food choices and timing of food intake reviewed today. I discussed a personalized meal plan with the patient that will help her to lose weight and will improve her obesity-related conditions going forward. I performed a medically necessary appropriate examination and/or evaluation. I discussed the assessment and treatment plan with the patient. Motivational interviewing as well as evidence-based interventions for health behavior change were utilized today including the discussion of self monitoring techniques, problem-solving barriers and SMART goal setting techniques.  The patient was provided an opportunity to ask questions and all were answered. The patient agreed with the plan and demonstrated an understanding of the instructions. Clinical information was updated and documented in the EMR.  I, Water quality scientist, CMA, am acting as transcriptionist for Briscoe Deutscher, DO  I have reviewed the above documentation for accuracy and completeness, and I agree with the above. -  Briscoe Deutscher, DO, MS, FAAFP, DABOM - Family and Bariatric Medicine.

## 2021-08-01 DIAGNOSIS — M5032 Other cervical disc degeneration, mid-cervical region, unspecified level: Secondary | ICD-10-CM | POA: Diagnosis not present

## 2021-08-01 DIAGNOSIS — M9902 Segmental and somatic dysfunction of thoracic region: Secondary | ICD-10-CM | POA: Diagnosis not present

## 2021-08-01 DIAGNOSIS — M9901 Segmental and somatic dysfunction of cervical region: Secondary | ICD-10-CM | POA: Diagnosis not present

## 2021-08-01 DIAGNOSIS — M542 Cervicalgia: Secondary | ICD-10-CM | POA: Diagnosis not present

## 2021-08-09 DIAGNOSIS — M542 Cervicalgia: Secondary | ICD-10-CM | POA: Diagnosis not present

## 2021-08-09 DIAGNOSIS — M9901 Segmental and somatic dysfunction of cervical region: Secondary | ICD-10-CM | POA: Diagnosis not present

## 2021-08-09 DIAGNOSIS — M5032 Other cervical disc degeneration, mid-cervical region, unspecified level: Secondary | ICD-10-CM | POA: Diagnosis not present

## 2021-08-09 DIAGNOSIS — M9902 Segmental and somatic dysfunction of thoracic region: Secondary | ICD-10-CM | POA: Diagnosis not present

## 2021-08-13 ENCOUNTER — Other Ambulatory Visit (HOSPITAL_COMMUNITY): Payer: Self-pay

## 2021-08-13 ENCOUNTER — Ambulatory Visit (INDEPENDENT_AMBULATORY_CARE_PROVIDER_SITE_OTHER): Payer: 59 | Admitting: Family Medicine

## 2021-08-13 ENCOUNTER — Other Ambulatory Visit: Payer: Self-pay

## 2021-08-13 ENCOUNTER — Encounter (INDEPENDENT_AMBULATORY_CARE_PROVIDER_SITE_OTHER): Payer: Self-pay | Admitting: Family Medicine

## 2021-08-13 VITALS — BP 99/65 | HR 69 | Temp 97.6°F | Ht 67.0 in | Wt 242.0 lb

## 2021-08-13 DIAGNOSIS — R632 Polyphagia: Secondary | ICD-10-CM | POA: Diagnosis not present

## 2021-08-13 DIAGNOSIS — E038 Other specified hypothyroidism: Secondary | ICD-10-CM

## 2021-08-13 DIAGNOSIS — Z6837 Body mass index (BMI) 37.0-37.9, adult: Secondary | ICD-10-CM | POA: Diagnosis not present

## 2021-08-13 DIAGNOSIS — E669 Obesity, unspecified: Secondary | ICD-10-CM

## 2021-08-13 DIAGNOSIS — Z6838 Body mass index (BMI) 38.0-38.9, adult: Secondary | ICD-10-CM

## 2021-08-13 DIAGNOSIS — Z9884 Bariatric surgery status: Secondary | ICD-10-CM | POA: Diagnosis not present

## 2021-08-13 MED ORDER — PHENTERMINE HCL 37.5 MG PO TABS
18.7500 mg | ORAL_TABLET | Freq: Every day | ORAL | 0 refills | Status: DC
  Filled 2021-08-13: qty 30, 30d supply, fill #0

## 2021-08-19 NOTE — Progress Notes (Signed)
Chief Complaint:   OBESITY Tammy Mooney is here to discuss her progress with her obesity treatment plan along with follow-up of her obesity related diagnoses. See Medical Weight Management Flowsheet for complete bioelectrical impedance results.  Today's visit was #: 2 Starting weight: 245 lbs Starting date: 07/18/2021 Weight change since last visit: 3 lbs Total lbs lost to date: 3 lbs Total weight loss percentage to date: -1.22%  Nutrition Plan: Category 2 Plan for 70% of the time.  Activity: Pilates for 50 minutes 3 times per week.   Interim History: Tammy Mooney says she is getting in 1700-1800 calories and 100 grams of protein per day.  She says she needs to come in every 2 weeks.  She says she is taking her vitamins again.  She struggles with hunger between 4 and 8 pm.  She says she drinks 60 ounces of water per day.  She reports that it is hard to think about sleep.  She gets around 8 hours per night, but it is not restful.  Assessment/Plan:   1. Polyphagia Not optimized. Current treatment: None.    Plan: She will continue to focus on protein-rich, low simple carbohydrate foods. We reviewed the importance of hydration, regular exercise for stress reduction, and restorative sleep.  - Start phentermine (ADIPEX-P) 37.5 MG tablet; Take 0.5-1 tablets (18.75-37.5 mg total) by mouth daily.  Dispense: 30 tablet; Refill: 0  We reviewed potential side effects including insomnia, dry mouth, increased heart rate and blood pressure, and increased anxiety. We reviewed reducing caffeine consumption while taking phentermine, especially if the patient is experiencing side effects. Alternative treatment options were discussed. All questions were answered, and the patient wishes to move forward with this medication.   I have consulted the [place] Controlled Substances Registry for this patient, and feel the risk/benefit ratio today is favorable for proceeding with this prescription for a controlled substance.  The patient understands monitoring parameters and red flags.   2. Other specified hypothyroidism Course: Stable. Medication: levothyroxine 112 mcg daily.   Plan: Patient was instructed not to take MVM or iron within 4 hours of taking thyroid medications.  We will continue to monitor alongside PCP as it relates to her weight loss journey.   Lab Results  Component Value Date   TSH 3.17 11/21/2020   3. History of bariatric surgery Tammy Mooney is at risk for malnutrition due to her previous bariatric surgery.   Counseling You may need to eat 3 meals and 2 snacks, or 5 small meals each day in order to reach your protein and calorie goals.  Allow at least 15 minutes for each meal so that you can eat mindfully. Listen to your body so that you do not overeat. For most people, your sleeve or pouch will comfortably hold 4-6 ounces. Eat foods from all food groups. This includes fruits and vegetables, grains, dairy, and meat and other proteins. Include a protein-rich food at every meal and snack, and eat the protein food first.  You should be taking a Bariatric Multivitamin as well as calcium.   4. Obesity, current BMI 37.9  Course: Tammy Mooney is currently in the action stage of change. As such, her goal is to continue with weight loss efforts.   Nutrition goals: She has agreed to the Category 2 Plan.   Exercise goals: For substantial health benefits, adults should do at least 150 minutes (2 hours and 30 minutes) a week of moderate-intensity, or 75 minutes (1 hour and 15 minutes) a week of vigorous-intensity  aerobic physical activity, or an equivalent combination of moderate- and vigorous-intensity aerobic activity. Aerobic activity should be performed in episodes of at least 10 minutes, and preferably, it should be spread throughout the week.  Behavioral modification strategies: increasing lean protein intake, decreasing simple carbohydrates, increasing vegetables, increasing water intake, and decreasing  liquid calories.  Tammy Mooney has agreed to follow-up with our clinic in 4 weeks. She was informed of the importance of frequent follow-up visits to maximize her success with intensive lifestyle modifications for her multiple health conditions.   Objective:   Blood pressure 99/65, pulse 69, temperature 97.6 F (36.4 C), temperature source Oral, height 5\' 7"  (1.702 m), weight 242 lb (109.8 kg), SpO2 98 %. Body mass index is 37.9 kg/m.  General: Cooperative, alert, well developed, in no acute distress. HEENT: Conjunctivae and lids unremarkable. Cardiovascular: Regular rhythm.  Lungs: Normal work of breathing. Neurologic: No focal deficits.   Lab Results  Component Value Date   CREATININE 0.71 11/21/2020   BUN 18 11/21/2020   NA 138 11/21/2020   K 4.0 11/21/2020   CL 105 11/21/2020   CO2 22 11/21/2020   Lab Results  Component Value Date   ALT 17 11/21/2020   AST 13 11/21/2020   ALKPHOS 77 11/21/2020   BILITOT 0.5 11/21/2020   Lab Results  Component Value Date   HGBA1C 5.6 05/08/2021   HGBA1C 6.0 11/21/2020   HGBA1C 5.7 02/01/2020   HGBA1C 5.6 07/27/2019   HGBA1C 6.3 03/02/2019   Lab Results  Component Value Date   INSULIN 79.9 (H) 12/24/2017   INSULIN 40.1 (H) 09/03/2017   INSULIN 39.3 (H) 05/12/2017   INSULIN 42.1 (H) 02/09/2017   Lab Results  Component Value Date   TSH 3.17 11/21/2020   Lab Results  Component Value Date   CHOL 190 11/21/2020   HDL 37.20 (L) 11/21/2020   LDLCALC 125 (H) 11/21/2020   TRIG 138.0 11/21/2020   CHOLHDL 5 11/21/2020   Lab Results  Component Value Date   VD25OH 72.32 02/01/2020   VD25OH 24.51 (L) 07/27/2019   VD25OH 37.5 12/24/2017   Lab Results  Component Value Date   WBC 9.9 11/21/2020   HGB 14.4 11/21/2020   HCT 42.2 11/21/2020   MCV 87.2 11/21/2020   PLT 367.0 11/21/2020   Lab Results  Component Value Date   IRON 67 11/22/2020   TIBC 325 11/22/2020   FERRITIN 196 11/22/2020   Attestation Statements:   Reviewed  by clinician on day of visit: allergies, medications, problem list, medical history, surgical history, family history, social history, and previous encounter notes.  I, Water quality scientist, CMA, am acting as transcriptionist for Briscoe Deutscher, DO  I have reviewed the above documentation for accuracy and completeness, and I agree with the above. -  Briscoe Deutscher, DO, MS, FAAFP, DABOM - Family and Bariatric Medicine.

## 2021-08-27 ENCOUNTER — Encounter (INDEPENDENT_AMBULATORY_CARE_PROVIDER_SITE_OTHER): Payer: Self-pay | Admitting: Family Medicine

## 2021-08-27 ENCOUNTER — Other Ambulatory Visit: Payer: Self-pay

## 2021-08-27 ENCOUNTER — Ambulatory Visit (INDEPENDENT_AMBULATORY_CARE_PROVIDER_SITE_OTHER): Payer: 59 | Admitting: Family Medicine

## 2021-08-27 VITALS — BP 101/68 | HR 72 | Temp 97.5°F | Ht 67.0 in | Wt 243.0 lb

## 2021-08-27 DIAGNOSIS — Z6838 Body mass index (BMI) 38.0-38.9, adult: Secondary | ICD-10-CM

## 2021-08-27 DIAGNOSIS — U071 COVID-19: Secondary | ICD-10-CM

## 2021-08-27 DIAGNOSIS — R632 Polyphagia: Secondary | ICD-10-CM

## 2021-08-27 DIAGNOSIS — E282 Polycystic ovarian syndrome: Secondary | ICD-10-CM | POA: Diagnosis not present

## 2021-08-27 DIAGNOSIS — E669 Obesity, unspecified: Secondary | ICD-10-CM

## 2021-08-28 NOTE — Progress Notes (Signed)
Chief Complaint:   OBESITY Tammy Mooney is here to discuss her progress with her obesity treatment plan along with follow-up of her obesity related diagnoses. See Medical Weight Management Flowsheet for complete bioelectrical impedance results.  Today's visit was #: 3 Starting weight: 245 lbs Starting date: 07/18/2021 Weight change since last visit: +1 lb Total lbs lost to date: 2 lbs Total weight loss percentage to date: -0.82%  Nutrition Plan: Category 2 Plan for 70% of the time.  Activity: Pilates for 50 minutes 2 times per week.  Anti-obesity medications: phentermine 18.75-37.5 mg daily. Reported side effects: None.  Interim History: Tammy Mooney says she took phentermine 37.5 mg for 1 week and had side effects of constipation, headache, and insomnia, so she stopped taking it.  She has been out of town for 6 days since her last visit.  Assessment/Plan:   1. Polyphagia Not at goal. Current treatment: phentermine 18.75-37.5 mg daily.    Plan: She will continue to focus on protein-rich, low simple carbohydrate foods. We reviewed the importance of hydration, regular exercise for stress reduction, and restorative sleep.  Restart phentermine at 1/2 tablet.  Eat at regular intervals to decrease hypoglycemia.  Increase water intake.  2. PCOS (polycystic ovarian syndrome), with IR She has an IUD that will be replaced soon.   Counseling PCOS is a leading cause of menstrual irregularities and infertility. It is also associated with obesity, hirsutism (excessive hair growth on the face, chest, or back), and cardiovascular risk factors such as high cholesterol and insulin resistance. Insulin resistance appears to play a central role.  Women with PCOS have been shown to have impaired appetite-regulating hormones. Women with polycystic ovary syndrome (PCOS) have an increased risk for cardiovascular disease (CVD) - European Journal of Preventive Cardiology.  3. Obesity, current BMI  38.1  Course: Tammy Mooney is currently in the action stage of change. As such, her goal is to continue with weight loss efforts.   Nutrition goals: She has agreed to the Category 2 Plan.   Exercise goals:  As is.  Behavioral modification strategies: increasing lean protein intake, decreasing simple carbohydrates, and increasing vegetables.  Tammy Mooney has agreed to follow-up with our clinic in 2 weeks. She was informed of the importance of frequent follow-up visits to maximize her success with intensive lifestyle modifications for her multiple health conditions.   Objective:   Blood pressure 101/68, pulse 72, temperature (!) 97.5 F (36.4 C), temperature source Oral, height 5\' 7"  (1.702 m), weight 243 lb (110.2 kg), SpO2 99 %. Body mass index is 38.06 kg/m.  General: Cooperative, alert, well developed, in no acute distress. HEENT: Conjunctivae and lids unremarkable. Cardiovascular: Regular rhythm.  Lungs: Normal work of breathing. Neurologic: No focal deficits.   Lab Results  Component Value Date   CREATININE 0.71 11/21/2020   BUN 18 11/21/2020   NA 138 11/21/2020   K 4.0 11/21/2020   CL 105 11/21/2020   CO2 22 11/21/2020   Lab Results  Component Value Date   ALT 17 11/21/2020   AST 13 11/21/2020   ALKPHOS 77 11/21/2020   BILITOT 0.5 11/21/2020   Lab Results  Component Value Date   HGBA1C 5.6 05/08/2021   HGBA1C 6.0 11/21/2020   HGBA1C 5.7 02/01/2020   HGBA1C 5.6 07/27/2019   HGBA1C 6.3 03/02/2019   Lab Results  Component Value Date   INSULIN 79.9 (H) 12/24/2017   INSULIN 40.1 (H) 09/03/2017   INSULIN 39.3 (H) 05/12/2017   INSULIN 42.1 (H) 02/09/2017  Lab Results  Component Value Date   TSH 3.17 11/21/2020   Lab Results  Component Value Date   CHOL 190 11/21/2020   HDL 37.20 (L) 11/21/2020   LDLCALC 125 (H) 11/21/2020   TRIG 138.0 11/21/2020   CHOLHDL 5 11/21/2020   Lab Results  Component Value Date   VD25OH 72.32 02/01/2020   VD25OH 24.51 (L)  07/27/2019   VD25OH 37.5 12/24/2017   Lab Results  Component Value Date   WBC 9.9 11/21/2020   HGB 14.4 11/21/2020   HCT 42.2 11/21/2020   MCV 87.2 11/21/2020   PLT 367.0 11/21/2020   Lab Results  Component Value Date   IRON 67 11/22/2020   TIBC 325 11/22/2020   FERRITIN 196 11/22/2020   Attestation Statements:   Reviewed by clinician on day of visit: allergies, medications, problem list, medical history, surgical history, family history, social history, and previous encounter notes.  I, Water quality scientist, CMA, am acting as transcriptionist for Briscoe Deutscher, DO  I have reviewed the above documentation for accuracy and completeness, and I agree with the above. -  Briscoe Deutscher, DO, MS, FAAFP, DABOM - Family and Bariatric Medicine.

## 2021-08-29 DIAGNOSIS — M5032 Other cervical disc degeneration, mid-cervical region, unspecified level: Secondary | ICD-10-CM | POA: Diagnosis not present

## 2021-08-29 DIAGNOSIS — M9902 Segmental and somatic dysfunction of thoracic region: Secondary | ICD-10-CM | POA: Diagnosis not present

## 2021-08-29 DIAGNOSIS — M9901 Segmental and somatic dysfunction of cervical region: Secondary | ICD-10-CM | POA: Diagnosis not present

## 2021-08-29 DIAGNOSIS — M542 Cervicalgia: Secondary | ICD-10-CM | POA: Diagnosis not present

## 2021-09-02 ENCOUNTER — Telehealth: Payer: Self-pay

## 2021-09-02 MED ORDER — NIRMATRELVIR/RITONAVIR (PAXLOVID)TABLET: 3.0000 | ORAL_TABLET | Freq: Two times a day (BID) | ORAL | 0 refills | Status: AC

## 2021-09-02 NOTE — Telephone Encounter (Signed)
Patient called in and wants to remove and replace your Mirena IUD. Talked to Dr. Jonni Sanger and states we need a approval for her mirena and we need to order it as well. Please advise.

## 2021-09-02 NOTE — Addendum Note (Signed)
Addended by: Briscoe Deutscher R on: 09/02/2021 05:41 PM   Modules accepted: Orders

## 2021-09-03 NOTE — Telephone Encounter (Signed)
Spoke with patient, advise to call insurance for coverage  If insurance cover let us know to order IUD and schedule appointment with PCP  Patient verbalized understanding

## 2021-09-11 ENCOUNTER — Ambulatory Visit (INDEPENDENT_AMBULATORY_CARE_PROVIDER_SITE_OTHER): Payer: 59 | Admitting: Family Medicine

## 2021-09-11 ENCOUNTER — Other Ambulatory Visit (HOSPITAL_COMMUNITY): Payer: Self-pay

## 2021-09-11 ENCOUNTER — Other Ambulatory Visit: Payer: Self-pay

## 2021-09-11 ENCOUNTER — Encounter (INDEPENDENT_AMBULATORY_CARE_PROVIDER_SITE_OTHER): Payer: Self-pay | Admitting: Family Medicine

## 2021-09-11 VITALS — BP 113/74 | HR 81 | Temp 97.8°F | Ht 67.0 in | Wt 240.0 lb

## 2021-09-11 DIAGNOSIS — Z9884 Bariatric surgery status: Secondary | ICD-10-CM | POA: Diagnosis not present

## 2021-09-11 DIAGNOSIS — F902 Attention-deficit hyperactivity disorder, combined type: Secondary | ICD-10-CM | POA: Diagnosis not present

## 2021-09-11 DIAGNOSIS — Z6837 Body mass index (BMI) 37.0-37.9, adult: Secondary | ICD-10-CM | POA: Diagnosis not present

## 2021-09-11 DIAGNOSIS — Z6838 Body mass index (BMI) 38.0-38.9, adult: Secondary | ICD-10-CM

## 2021-09-11 DIAGNOSIS — E669 Obesity, unspecified: Secondary | ICD-10-CM

## 2021-09-11 MED ORDER — LISDEXAMFETAMINE DIMESYLATE 20 MG PO CAPS
20.0000 mg | ORAL_CAPSULE | Freq: Every day | ORAL | 0 refills | Status: DC
  Filled 2021-09-11 – 2021-09-13 (×2): qty 30, 30d supply, fill #0

## 2021-09-13 ENCOUNTER — Other Ambulatory Visit (HOSPITAL_COMMUNITY): Payer: Self-pay

## 2021-09-16 ENCOUNTER — Ambulatory Visit: Payer: 59 | Admitting: Family Medicine

## 2021-09-16 NOTE — Progress Notes (Signed)
Chief Complaint:   OBESITY Tammy Mooney is here to discuss her progress with her obesity treatment plan along with follow-up of her obesity related diagnoses. See Medical Weight Management Flowsheet for complete bioelectrical impedance results.  Today's visit was #: 4 Starting weight: 245 lbs Starting date: 07/18/2021 Weight change since last visit: 3 lbs Total lbs lost to date: 5 lbs Total weight loss percentage to date: -2.04%  Nutrition Plan: Category 2 Plan for 25% of the time.  Activity: None. Anti-obesity medications: phentermine 18.75-37.5 mg daily. Reported side effects: None.  Interim History: Elberta will be going out of town for 1-2 weeks.  She says she recently had COVID.  Assessment/Plan:   1. History of bariatric surgery Kaija is at risk for malnutrition due to her previous bariatric surgery.   Counseling You may need to eat 3 meals and 2 snacks, or 5 small meals each day in order to reach your protein and calorie goals.  Allow at least 15 minutes for each meal so that you can eat mindfully. Listen to your body so that you do not overeat. For most people, your sleeve or pouch will comfortably hold 4-6 ounces. Eat foods from all food groups. This includes fruits and vegetables, grains, dairy, and meat and other proteins. Include a protein-rich food at every meal and snack, and eat the protein food first.  You should be taking a Bariatric Multivitamin as well as calcium.   2. Attention deficit hyperactivity disorder (ADHD), combined type Not at goal. Medication: None.  Counseling ADHD is the most missed diagnosis in relation to food and appetite problems. Often the strong urge to binge or to self-medicate with food subsides once the impulsivity and inattention of ADHD are treated. A person can experience a new ability to tune in to the body's signals, control cravings and improve impulse control.  A deficiency in norepinephrine and dopamine can lead to the  following behaviors related to eating:  Poor awareness of internal cues of hunger and satiety, or fullness. Inability to follow a meal plan. Inability to judge portion size accurately. Inability to stop bingeing or purging. Distraction by continual thoughts of food, weight and body shape. Increased desire to overeat, especially high calorie, "reward" type foods. Poor self-esteem due to repeated failures of self-control.  Plan: Start Vyvanse 20 mg daily, as per below..  - Start lisdexamfetamine (VYVANSE) 20 MG capsule; Take 1 capsule (20 mg total) by mouth daily.  Dispense: 30 capsule; Refill: 0  I have consulted the [place] Controlled Substances Registry for this patient, and feel the risk/benefit ratio today is favorable for proceeding with this prescription for a controlled substance. The patient understands monitoring parameters and red flags.   3. Obesity, current BMI 37.7  Course: Kyran is currently in the action stage of change. As such, her goal is to continue with weight loss efforts.   Nutrition goals: She has agreed to the Category 2 Plan.   Exercise goals:  As is.  Behavioral modification strategies: increasing lean protein intake, decreasing simple carbohydrates, increasing vegetables, increasing water intake, and decreasing liquid calories.  Desirai has agreed to follow-up with our clinic in 2 weeks. She was informed of the importance of frequent follow-up visits to maximize her success with intensive lifestyle modifications for her multiple health conditions.   Objective:   Blood pressure 113/74, pulse 81, temperature 97.8 F (36.6 C), temperature source Oral, height 5\' 7"  (1.702 m), weight 240 lb (108.9 kg), SpO2 98 %. Body mass index  is 37.59 kg/m.  General: Cooperative, alert, well developed, in no acute distress. HEENT: Conjunctivae and lids unremarkable. Cardiovascular: Regular rhythm.  Lungs: Normal work of breathing. Neurologic: No focal deficits.   Lab  Results  Component Value Date   CREATININE 0.71 11/21/2020   BUN 18 11/21/2020   NA 138 11/21/2020   K 4.0 11/21/2020   CL 105 11/21/2020   CO2 22 11/21/2020   Lab Results  Component Value Date   ALT 17 11/21/2020   AST 13 11/21/2020   ALKPHOS 77 11/21/2020   BILITOT 0.5 11/21/2020   Lab Results  Component Value Date   HGBA1C 5.6 05/08/2021   HGBA1C 6.0 11/21/2020   HGBA1C 5.7 02/01/2020   HGBA1C 5.6 07/27/2019   HGBA1C 6.3 03/02/2019   Lab Results  Component Value Date   INSULIN 79.9 (H) 12/24/2017   INSULIN 40.1 (H) 09/03/2017   INSULIN 39.3 (H) 05/12/2017   INSULIN 42.1 (H) 02/09/2017   Lab Results  Component Value Date   TSH 3.17 11/21/2020   Lab Results  Component Value Date   CHOL 190 11/21/2020   HDL 37.20 (L) 11/21/2020   LDLCALC 125 (H) 11/21/2020   TRIG 138.0 11/21/2020   CHOLHDL 5 11/21/2020   Lab Results  Component Value Date   VD25OH 72.32 02/01/2020   VD25OH 24.51 (L) 07/27/2019   VD25OH 37.5 12/24/2017   Lab Results  Component Value Date   WBC 9.9 11/21/2020   HGB 14.4 11/21/2020   HCT 42.2 11/21/2020   MCV 87.2 11/21/2020   PLT 367.0 11/21/2020   Lab Results  Component Value Date   IRON 67 11/22/2020   TIBC 325 11/22/2020   FERRITIN 196 11/22/2020   Attestation Statements:   Reviewed by clinician on day of visit: allergies, medications, problem list, medical history, surgical history, family history, social history, and previous encounter notes.  I, Water quality scientist, CMA, am acting as transcriptionist for Briscoe Deutscher, DO  I have reviewed the above documentation for accuracy and completeness, and I agree with the above. -  Briscoe Deutscher, DO, MS, FAAFP, DABOM - Family and Bariatric Medicine.

## 2021-09-17 ENCOUNTER — Encounter (INDEPENDENT_AMBULATORY_CARE_PROVIDER_SITE_OTHER): Payer: Self-pay | Admitting: Family Medicine

## 2021-09-25 ENCOUNTER — Other Ambulatory Visit (HOSPITAL_COMMUNITY): Payer: Self-pay

## 2021-10-10 ENCOUNTER — Ambulatory Visit (INDEPENDENT_AMBULATORY_CARE_PROVIDER_SITE_OTHER): Payer: 59 | Admitting: Family Medicine

## 2021-10-24 ENCOUNTER — Ambulatory Visit (INDEPENDENT_AMBULATORY_CARE_PROVIDER_SITE_OTHER): Payer: 59 | Admitting: Family Medicine

## 2021-10-24 ENCOUNTER — Encounter (INDEPENDENT_AMBULATORY_CARE_PROVIDER_SITE_OTHER): Payer: Self-pay | Admitting: Family Medicine

## 2021-10-24 ENCOUNTER — Encounter (HOSPITAL_COMMUNITY): Payer: Self-pay | Admitting: *Deleted

## 2021-10-24 ENCOUNTER — Other Ambulatory Visit (HOSPITAL_COMMUNITY): Payer: Self-pay

## 2021-10-24 VITALS — BP 114/75 | HR 99 | Temp 97.8°F | Ht 67.0 in | Wt 244.0 lb

## 2021-10-24 DIAGNOSIS — F3289 Other specified depressive episodes: Secondary | ICD-10-CM

## 2021-10-24 DIAGNOSIS — E038 Other specified hypothyroidism: Secondary | ICD-10-CM

## 2021-10-24 DIAGNOSIS — E669 Obesity, unspecified: Secondary | ICD-10-CM

## 2021-10-24 DIAGNOSIS — Z6837 Body mass index (BMI) 37.0-37.9, adult: Secondary | ICD-10-CM | POA: Diagnosis not present

## 2021-10-24 DIAGNOSIS — R632 Polyphagia: Secondary | ICD-10-CM

## 2021-10-24 MED ORDER — VENLAFAXINE HCL ER 37.5 MG PO CP24
37.5000 mg | ORAL_CAPSULE | Freq: Every day | ORAL | 3 refills | Status: DC
  Filled 2021-10-24: qty 90, 90d supply, fill #0
  Filled 2022-01-18: qty 90, 90d supply, fill #1
  Filled 2022-06-30: qty 90, 90d supply, fill #2

## 2021-10-25 ENCOUNTER — Other Ambulatory Visit (HOSPITAL_COMMUNITY): Payer: Self-pay

## 2021-10-25 ENCOUNTER — Other Ambulatory Visit: Payer: Self-pay | Admitting: Family Medicine

## 2021-10-25 DIAGNOSIS — E039 Hypothyroidism, unspecified: Secondary | ICD-10-CM

## 2021-10-28 ENCOUNTER — Other Ambulatory Visit (HOSPITAL_COMMUNITY): Payer: Self-pay

## 2021-10-29 ENCOUNTER — Other Ambulatory Visit (HOSPITAL_COMMUNITY): Payer: Self-pay

## 2021-10-29 MED ORDER — LEVOTHYROXINE SODIUM 112 MCG PO TABS
112.0000 ug | ORAL_TABLET | Freq: Every day | ORAL | 3 refills | Status: DC
  Filled 2021-10-29 – 2022-01-02 (×3): qty 90, 90d supply, fill #0
  Filled 2022-03-28: qty 90, 90d supply, fill #1
  Filled 2022-06-30: qty 90, 90d supply, fill #2

## 2021-10-29 NOTE — Progress Notes (Signed)
Chief Complaint:   OBESITY Tammy Mooney is here to discuss her progress with her obesity treatment plan along with follow-up of her obesity related diagnoses. See Medical Weight Management Flowsheet for complete bioelectrical impedance results.  Today's visit was #: 5 Starting weight: 245 lbs Starting date: 07/18/2021 Weight change since last visit: +4 lbs Total lbs lost to date: 1 lb Total weight loss percentage to date: -0.41%  Nutrition Plan: Category 2 Plan for 50% of the time.  Activity: Pilates/walking for 50 minutes 3-4 times per week. Anti-obesity medications: phentermine 18.75-37.5 mg daily. Reported side effects: None.  Interim History: Tammy Mooney says she has been very busy.  She had a birthday vacation.  She says she has been exercising more.  She says she stopped Trintellix and she never picked up Vyvanse due to cost.  Assessment/Plan:   1. Polyphagia Not at goal. Current treatment: phentermine 18.75-37.5 mg daily.  Vyvanse is too expensive.  Plan:  She will continue to focus on protein-rich, low simple carbohydrate foods. We reviewed the importance of hydration, regular exercise for stress reduction, and restorative sleep.  2. Other specified hypothyroidism Course: Controlled. Medication: levothyroxine 112 mcg daily.   Plan: Patient was instructed not to take MVM or iron within 4 hours of taking thyroid medications.  We will continue to monitor alongside PCP as it relates to her weight loss journey.   Lab Results  Component Value Date   TSH 3.17 11/21/2020   3. Other depression, with emotional eating Not at goal. Medication: None.   Plan: Start Effexor-XR 37.5 mg daily.  Discussed cues and consequences, how thoughts affect eating, model of thoughts, feelings, and behaviors, and strategies for change by focusing on the cue. Discussed cognitive distortions, coping thoughts, and how to change your thoughts.  - Start venlafaxine XR (EFFEXOR XR) 37.5 MG 24 hr capsule;  Take 1 capsule (37.5 mg total) by mouth daily with breakfast.  Dispense: 90 capsule; Refill: 3  4. Obesity, current BMI 37.7  Course: Tammy Mooney is currently in the action stage of change. As such, her goal is to continue with weight loss efforts.   Nutrition goals: She has agreed to the Category 2 Plan.   Exercise goals:  As is.  Behavioral modification strategies: increasing lean protein intake, decreasing simple carbohydrates, and increasing vegetables.  Tammy Mooney has agreed to follow-up with our clinic in 6 weeks. She was informed of the importance of frequent follow-up visits to maximize her success with intensive lifestyle modifications for her multiple health conditions.   Objective:   Blood pressure 114/75, pulse 99, temperature 97.8 F (36.6 C), temperature source Oral, height '5\' 7"'$  (1.702 m), weight 244 lb (110.7 kg), SpO2 98 %. Body mass index is 38.22 kg/m.  General: Cooperative, alert, well developed, in no acute distress. HEENT: Conjunctivae and lids unremarkable. Cardiovascular: Regular rhythm.  Lungs: Normal work of breathing. Neurologic: No focal deficits.   Lab Results  Component Value Date   CREATININE 0.71 11/21/2020   BUN 18 11/21/2020   NA 138 11/21/2020   K 4.0 11/21/2020   CL 105 11/21/2020   CO2 22 11/21/2020   Lab Results  Component Value Date   ALT 17 11/21/2020   AST 13 11/21/2020   ALKPHOS 77 11/21/2020   BILITOT 0.5 11/21/2020   Lab Results  Component Value Date   HGBA1C 5.6 05/08/2021   HGBA1C 6.0 11/21/2020   HGBA1C 5.7 02/01/2020   HGBA1C 5.6 07/27/2019   HGBA1C 6.3 03/02/2019   Lab Results  Component Value Date   INSULIN 79.9 (H) 12/24/2017   INSULIN 40.1 (H) 09/03/2017   INSULIN 39.3 (H) 05/12/2017   INSULIN 42.1 (H) 02/09/2017   Lab Results  Component Value Date   TSH 3.17 11/21/2020   Lab Results  Component Value Date   CHOL 190 11/21/2020   HDL 37.20 (L) 11/21/2020   LDLCALC 125 (H) 11/21/2020   TRIG 138.0 11/21/2020    CHOLHDL 5 11/21/2020   Lab Results  Component Value Date   VD25OH 72.32 02/01/2020   VD25OH 24.51 (L) 07/27/2019   VD25OH 37.5 12/24/2017   Lab Results  Component Value Date   WBC 9.9 11/21/2020   HGB 14.4 11/21/2020   HCT 42.2 11/21/2020   MCV 87.2 11/21/2020   PLT 367.0 11/21/2020   Lab Results  Component Value Date   IRON 67 11/22/2020   TIBC 325 11/22/2020   FERRITIN 196 11/22/2020   Attestation Statements:   Reviewed by clinician on day of visit: allergies, medications, problem list, medical history, surgical history, family history, social history, and previous encounter notes.  I, Water quality scientist, CMA, am acting as transcriptionist for Briscoe Deutscher, DO  I have reviewed the above documentation for accuracy and completeness, and I agree with the above. -  Briscoe Deutscher, DO, MS, FAAFP, DABOM - Family and Bariatric Medicine.

## 2021-11-14 ENCOUNTER — Other Ambulatory Visit (HOSPITAL_BASED_OUTPATIENT_CLINIC_OR_DEPARTMENT_OTHER): Payer: Self-pay

## 2021-11-14 MED ORDER — CLINDAMYCIN HCL 150 MG PO CAPS
ORAL_CAPSULE | ORAL | 0 refills | Status: DC
  Filled 2021-11-14: qty 40, 10d supply, fill #0

## 2021-11-20 DIAGNOSIS — Z9884 Bariatric surgery status: Secondary | ICD-10-CM | POA: Diagnosis not present

## 2021-11-20 DIAGNOSIS — E282 Polycystic ovarian syndrome: Secondary | ICD-10-CM | POA: Diagnosis not present

## 2021-11-20 DIAGNOSIS — F419 Anxiety disorder, unspecified: Secondary | ICD-10-CM | POA: Diagnosis not present

## 2021-11-20 DIAGNOSIS — E039 Hypothyroidism, unspecified: Secondary | ICD-10-CM | POA: Diagnosis not present

## 2021-11-20 DIAGNOSIS — G4700 Insomnia, unspecified: Secondary | ICD-10-CM | POA: Diagnosis not present

## 2021-11-20 DIAGNOSIS — K047 Periapical abscess without sinus: Secondary | ICD-10-CM | POA: Diagnosis not present

## 2021-11-20 DIAGNOSIS — E669 Obesity, unspecified: Secondary | ICD-10-CM | POA: Diagnosis not present

## 2021-12-02 ENCOUNTER — Other Ambulatory Visit: Payer: Self-pay | Admitting: Family Medicine

## 2021-12-02 ENCOUNTER — Other Ambulatory Visit (HOSPITAL_COMMUNITY): Payer: Self-pay

## 2021-12-02 DIAGNOSIS — G4709 Other insomnia: Secondary | ICD-10-CM

## 2021-12-02 NOTE — Telephone Encounter (Signed)
Pt requesting refill for Trazodone. Has not been filled by you before. Last OV 05/08/2021. ?

## 2021-12-03 ENCOUNTER — Other Ambulatory Visit (HOSPITAL_COMMUNITY): Payer: Self-pay

## 2021-12-03 MED ORDER — TRAZODONE HCL 50 MG PO TABS
ORAL_TABLET | Freq: Every day | ORAL | 3 refills | Status: DC
  Filled 2021-12-03: qty 90, fill #0
  Filled 2022-01-18: qty 90, 90d supply, fill #0
  Filled 2022-06-12: qty 90, 90d supply, fill #1
  Filled 2022-09-09: qty 90, 90d supply, fill #2
  Filled 2022-11-21: qty 90, 90d supply, fill #3

## 2021-12-10 DIAGNOSIS — F419 Anxiety disorder, unspecified: Secondary | ICD-10-CM | POA: Diagnosis not present

## 2021-12-10 DIAGNOSIS — E039 Hypothyroidism, unspecified: Secondary | ICD-10-CM | POA: Diagnosis not present

## 2021-12-10 DIAGNOSIS — E282 Polycystic ovarian syndrome: Secondary | ICD-10-CM | POA: Diagnosis not present

## 2021-12-10 DIAGNOSIS — E669 Obesity, unspecified: Secondary | ICD-10-CM | POA: Diagnosis not present

## 2021-12-12 ENCOUNTER — Other Ambulatory Visit (HOSPITAL_BASED_OUTPATIENT_CLINIC_OR_DEPARTMENT_OTHER): Payer: Self-pay

## 2021-12-12 MED ORDER — VENLAFAXINE HCL ER 37.5 MG PO CP24
ORAL_CAPSULE | ORAL | 2 refills | Status: DC
  Filled 2021-12-12: qty 30, 30d supply, fill #0
  Filled 2022-06-12: qty 30, fill #0

## 2021-12-18 ENCOUNTER — Other Ambulatory Visit: Payer: Self-pay | Admitting: Family Medicine

## 2021-12-18 ENCOUNTER — Ambulatory Visit
Admission: RE | Admit: 2021-12-18 | Discharge: 2021-12-18 | Disposition: A | Discharge status: Home / Self Care | Payer: 59 | Source: Ambulatory Visit | Attending: Family Medicine | Admitting: Family Medicine

## 2021-12-18 DIAGNOSIS — R921 Mammographic calcification found on diagnostic imaging of breast: Secondary | ICD-10-CM

## 2021-12-24 DIAGNOSIS — R0681 Apnea, not elsewhere classified: Secondary | ICD-10-CM | POA: Diagnosis not present

## 2021-12-25 DIAGNOSIS — R0681 Apnea, not elsewhere classified: Secondary | ICD-10-CM | POA: Diagnosis not present

## 2021-12-30 ENCOUNTER — Other Ambulatory Visit (HOSPITAL_COMMUNITY): Payer: Self-pay

## 2022-01-02 ENCOUNTER — Other Ambulatory Visit (HOSPITAL_BASED_OUTPATIENT_CLINIC_OR_DEPARTMENT_OTHER): Payer: Self-pay

## 2022-01-02 ENCOUNTER — Other Ambulatory Visit (HOSPITAL_COMMUNITY): Payer: Self-pay

## 2022-01-02 DIAGNOSIS — L304 Erythema intertrigo: Secondary | ICD-10-CM | POA: Diagnosis not present

## 2022-01-02 DIAGNOSIS — G4733 Obstructive sleep apnea (adult) (pediatric): Secondary | ICD-10-CM | POA: Diagnosis not present

## 2022-01-02 DIAGNOSIS — E118 Type 2 diabetes mellitus with unspecified complications: Secondary | ICD-10-CM | POA: Diagnosis not present

## 2022-01-02 DIAGNOSIS — M546 Pain in thoracic spine: Secondary | ICD-10-CM | POA: Diagnosis not present

## 2022-01-15 ENCOUNTER — Other Ambulatory Visit (HOSPITAL_BASED_OUTPATIENT_CLINIC_OR_DEPARTMENT_OTHER): Payer: Self-pay

## 2022-01-15 ENCOUNTER — Other Ambulatory Visit (HOSPITAL_COMMUNITY): Payer: Self-pay

## 2022-01-15 MED ORDER — MOUNJARO 2.5 MG/0.5ML ~~LOC~~ SOAJ
SUBCUTANEOUS | 1 refills | Status: DC
  Filled 2022-01-15: qty 2, 28d supply, fill #0
  Filled 2022-02-13: qty 2, 28d supply, fill #1

## 2022-01-16 ENCOUNTER — Other Ambulatory Visit (HOSPITAL_BASED_OUTPATIENT_CLINIC_OR_DEPARTMENT_OTHER): Payer: Self-pay

## 2022-01-16 ENCOUNTER — Encounter (HOSPITAL_BASED_OUTPATIENT_CLINIC_OR_DEPARTMENT_OTHER): Payer: Self-pay

## 2022-01-17 ENCOUNTER — Encounter (HOSPITAL_BASED_OUTPATIENT_CLINIC_OR_DEPARTMENT_OTHER): Payer: Self-pay

## 2022-01-17 ENCOUNTER — Other Ambulatory Visit (HOSPITAL_BASED_OUTPATIENT_CLINIC_OR_DEPARTMENT_OTHER): Payer: Self-pay

## 2022-01-17 MED ORDER — RHOFADE 1 % EX CREA
TOPICAL_CREAM | CUTANEOUS | 3 refills | Status: DC
  Filled 2022-01-17: qty 30, 90d supply, fill #0

## 2022-01-18 ENCOUNTER — Other Ambulatory Visit (HOSPITAL_COMMUNITY): Payer: Self-pay

## 2022-01-20 ENCOUNTER — Other Ambulatory Visit (HOSPITAL_BASED_OUTPATIENT_CLINIC_OR_DEPARTMENT_OTHER): Payer: Self-pay

## 2022-01-20 ENCOUNTER — Other Ambulatory Visit (HOSPITAL_COMMUNITY): Payer: Self-pay

## 2022-01-22 ENCOUNTER — Telehealth: Payer: Self-pay | Admitting: Family Medicine

## 2022-01-22 NOTE — Telephone Encounter (Signed)
See below

## 2022-01-22 NOTE — Telephone Encounter (Signed)
Patient called wanting to sch CPE before 9/1- ([employer] employee) Dr Jonni Sanger does not have any avl apts for CPS's until October -    Patient requested to see a different provider for this time only for CPE- Is this ok with Dr Jonni Sanger?

## 2022-01-29 ENCOUNTER — Ambulatory Visit (INDEPENDENT_AMBULATORY_CARE_PROVIDER_SITE_OTHER): Payer: 59 | Admitting: Family Medicine

## 2022-01-29 ENCOUNTER — Other Ambulatory Visit (HOSPITAL_COMMUNITY): Payer: Self-pay

## 2022-01-29 ENCOUNTER — Encounter: Payer: Self-pay | Admitting: Family Medicine

## 2022-01-29 VITALS — BP 100/70 | HR 80 | Temp 98.3°F | Ht 67.0 in | Wt 237.6 lb

## 2022-01-29 DIAGNOSIS — R7303 Prediabetes: Secondary | ICD-10-CM

## 2022-01-29 DIAGNOSIS — E538 Deficiency of other specified B group vitamins: Secondary | ICD-10-CM

## 2022-01-29 DIAGNOSIS — E039 Hypothyroidism, unspecified: Secondary | ICD-10-CM | POA: Diagnosis not present

## 2022-01-29 DIAGNOSIS — Z0000 Encounter for general adult medical examination without abnormal findings: Secondary | ICD-10-CM

## 2022-01-29 DIAGNOSIS — E559 Vitamin D deficiency, unspecified: Secondary | ICD-10-CM | POA: Diagnosis not present

## 2022-01-29 DIAGNOSIS — Z975 Presence of (intrauterine) contraceptive device: Secondary | ICD-10-CM

## 2022-01-29 DIAGNOSIS — E282 Polycystic ovarian syndrome: Secondary | ICD-10-CM | POA: Diagnosis not present

## 2022-01-29 DIAGNOSIS — H8112 Benign paroxysmal vertigo, left ear: Secondary | ICD-10-CM

## 2022-01-29 DIAGNOSIS — F339 Major depressive disorder, recurrent, unspecified: Secondary | ICD-10-CM | POA: Diagnosis not present

## 2022-01-29 LAB — CBC WITH DIFFERENTIAL/PLATELET
Basophils Absolute: 0.1 10*3/uL (ref 0.0–0.1)
Basophils Relative: 0.9 % (ref 0.0–3.0)
Eosinophils Absolute: 0.1 10*3/uL (ref 0.0–0.7)
Eosinophils Relative: 1.3 % (ref 0.0–5.0)
HCT: 42.7 % (ref 36.0–46.0)
Hemoglobin: 14.2 g/dL (ref 12.0–15.0)
Lymphocytes Relative: 31.9 % (ref 12.0–46.0)
Lymphs Abs: 2.1 10*3/uL (ref 0.7–4.0)
MCHC: 33.3 g/dL (ref 30.0–36.0)
MCV: 88 fl (ref 78.0–100.0)
Monocytes Absolute: 0.4 10*3/uL (ref 0.1–1.0)
Monocytes Relative: 5.8 % (ref 3.0–12.0)
Neutro Abs: 4 10*3/uL (ref 1.4–7.7)
Neutrophils Relative %: 60.1 % (ref 43.0–77.0)
Platelets: 379 10*3/uL (ref 150.0–400.0)
RBC: 4.85 Mil/uL (ref 3.87–5.11)
RDW: 13.7 % (ref 11.5–15.5)
WBC: 6.6 10*3/uL (ref 4.0–10.5)

## 2022-01-29 LAB — LIPID PANEL
Cholesterol: 187 mg/dL (ref 0–200)
HDL: 33.8 mg/dL — ABNORMAL LOW (ref >39.00)
LDL Cholesterol: 132 mg/dL — ABNORMAL HIGH (ref 0–99)
NonHDL: 153.63
Total CHOL/HDL Ratio: 6
Triglycerides: 107 mg/dL (ref 0.0–149.0)
VLDL: 21.4 mg/dL (ref 0.0–40.0)

## 2022-01-29 LAB — COMPREHENSIVE METABOLIC PANEL
ALT: 28 U/L (ref 0–35)
AST: 17 U/L (ref 0–37)
Albumin: 4.5 g/dL (ref 3.5–5.2)
Alkaline Phosphatase: 71 U/L (ref 39–117)
BUN: 16 mg/dL (ref 6–23)
CO2: 24 mEq/L (ref 19–32)
Calcium: 9.8 mg/dL (ref 8.4–10.5)
Chloride: 106 mEq/L (ref 96–112)
Creatinine, Ser: 0.7 mg/dL (ref 0.40–1.20)
GFR: 106.78 mL/min (ref >60.00)
Glucose, Bld: 82 mg/dL (ref 70–99)
Potassium: 4 mEq/L (ref 3.5–5.1)
Sodium: 137 mEq/L (ref 135–145)
Total Bilirubin: 0.6 mg/dL (ref 0.2–1.2)
Total Protein: 7 g/dL (ref 6.0–8.3)

## 2022-01-29 LAB — TSH: TSH: 2.01 u[IU]/mL (ref 0.35–5.50)

## 2022-01-29 LAB — VITAMIN D 25 HYDROXY (VIT D DEFICIENCY, FRACTURES): VITD: 29.32 ng/mL — ABNORMAL LOW (ref 30.00–100.00)

## 2022-01-29 LAB — HEMOGLOBIN A1C: Hgb A1c MFr Bld: 6 % (ref 4.6–6.5)

## 2022-01-29 LAB — VITAMIN B12: Vitamin B-12: 326 pg/mL (ref 211–911)

## 2022-01-29 MED ORDER — MECLIZINE HCL 25 MG PO TABS
25.0000 mg | ORAL_TABLET | Freq: Three times a day (TID) | ORAL | 0 refills | Status: DC | PRN
  Filled 2022-01-29: qty 30, 10d supply, fill #0

## 2022-01-29 NOTE — Progress Notes (Signed)
Subjective  Chief Complaint  Patient presents with   Annual Exam    Pt here for Annual exam and is currently is fasting. Pt also having some vertigo for the pass 2 weeks    HPI: Tammy Mooney is a 41 y.o. female who presents to [facility] [place] today for a Female Wellness Visit. She also has the concerns and/or needs as listed above in the chief complaint. These will be addressed in addition to the Health Maintenance Visit.   Wellness Visit: annual visit with health maintenance review and exam without Pap  Health maintenance: Patient has GYN and is due for female wellness exam including Pap smear.  Mammogram is scheduled.  She had an abnormal this year and is having surveillance follow-up.  She does have an IUD in place and would like to switch it out for a new one with her GYN.  She is seeing weight loss specialist and was started on Mounjaro.  Immunizations are current.  She is doing eye exam. Chronic disease f/u and/or acute problem visit: (deemed necessary to be done in addition to the wellness visit): Hypothyroidism on 112 mcg levothyroxine daily.  Feels well.  Due for recheck. Major depression now on Effexor. Prediabetes and PCOS on Mounjaro.  Working on weight loss. D and B12 deficiencies but not very consistent with taking supplements. Complains of 2-week course of intermittent positional vertigo.  Only on the left side.  No severe symptoms.  No nausea vomiting or neurologic deficits.  Assessment  1. Annual physical exam   2. Primary hypothyroidism   3. Morbid obesity ([place])   4. Major depression, recurrent, chronic ([place])   5. Prediabetes   6. PCOS (polycystic ovarian syndrome)   7. IUD (intrauterine device) in place   8. Vitamin D deficiency   9. Vitamin B12 deficiency   10. Benign paroxysmal positional vertigo of left ear      Plan  Female Wellness Visit: Age appropriate Health Maintenance and Prevention measures were discussed with patient.  Included topics are cancer screening recommendations, ways to keep healthy (see AVS) including dietary and exercise recommendations, regular eye and dental care, use of seat belts, and avoidance of moderate alcohol use and tobacco use.  Patient to schedule with GYN for Pap smear and IUD replacement. BMI: discussed patient's BMI and encouraged positive lifestyle modifications to help get to or maintain a target BMI. HM needs and immunizations were addressed and ordered. See below for orders. See HM and immunization section for updates. Routine labs and screening tests ordered including cmp, cbc and lipids where appropriate. Discussed recommendations regarding Vit D and calcium supplementation (see AVS)  Chronic disease management visit and/or acute problem visit: Low thyroid: clinically well. Recheck today. On 139mg daily; adjust if needed Obesity: mounjaro counseling/education given. Should be helpful.  Prediabetes: diet and mounjaro. Recheck a1c today Depression polyphasia: Effexor.  Monitoring.  Mood is well controlled. Benign positional vertigo: No red flag symptoms.  Education counseling given.  Meclizine as needed and monitor.  For vestibular rehab if persist or worsens Recheck vitamin D and B12 levels.  Recommend supplements.  Follow up: No follow-ups on file.  Orders Placed This Encounter  Procedures   CBC with Differential/Platelet   Comp Met (CMET)   Lipid Profile   Hemoglobin A1c   Vitamin B12   VITAMIN D 25 Hydroxy (Vit-D Deficiency, Fractures)   TSH   Meds ordered this encounter  Medications   meclizine (ANTIVERT) 25 MG tablet  Sig: Take 1 tablet (25 mg total) by mouth 3 (three) times daily as needed for dizziness.    Dispense:  30 tablet    Refill:  0      Body mass index is 37.21 kg/m. Wt Readings from Last 3 Encounters:  01/29/22 237 lb 9.6 oz (107.8 kg)  10/24/21 244 lb (110.7 kg)  09/11/21 240 lb (108.9 kg)     Patient Active Problem List   Diagnosis  Date Noted   Goiter 05/08/2021    Priority: Medium     Ultrasound 202: heterogenous, single subcentimeter nodule. No further surveillance.    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 MRI available.    Primary insomnia 09/17/2017    Priority: Medium    Anxiety 11/13/2015    Priority: Medium     Mild. Patient takes Buspar as needed.    PCOS (polycystic ovarian syndrome) 09/25/2011    Priority: Medium    Vitamin B12 deficiency 05/08/2021    Priority: Low   Rosacea 11/21/2020    Priority: Low   Vitamin D deficiency 02/23/2017    Priority: Low    repleted 07/2019    Allergic rhinitis 01/03/2016    Priority: Low    Controlled with Zyrtec.    Health Maintenance  Topic Date Due   COVID-19 Vaccine (1) 02/14/2022 (Originally 04/19/1981)   INFLUENZA VACCINE  02/18/2022   PAP SMEAR-Modifier  07/10/2022   MAMMOGRAM  12/19/2022   TETANUS/TDAP  10/24/2024   Hepatitis C Screening  Completed   HIV Screening  Completed   HPV VACCINES  Aged Out   Immunization History  Administered Date(s) Administered   Influenza,inj,Quad PF,6+ Mos 04/12/2018   Influenza-Unspecified 05/14/2017, 04/21/2019, 05/07/2021   We updated and reviewed the patient's past history in detail and it is documented below. Allergies: Patient is allergic to adhesive [tape] and penicillins. Past Medical History Patient  has a past medical history of Anxiety, Coronavirus infection, Depression, DM (diabetes mellitus), type 2 ([place]), Fatty liver, Hypothyroidism, Infertility, female, Low HDL (under 40), PCOS  (polycystic ovarian syndrome), Varicella, and Vertigo. Past Surgical History Patient  has a past surgical history that includes Cesarean section; Tonsillectomy; Eye surgery; Nasal sinus surgery; Cesarean section (07/01/2012); Cesarean section (N/A, 10/25/2014); Mandible surgery (AGE 78); Image guided sinus surgery (N/A, 06/05/2016); Turbinate reduction (Bilateral, 06/05/2016); Sinusotomy (Left, 06/05/2016); Maxillary antrostomy (Right, 06/05/2016); Hardware Removal (Left, 06/05/2016); and Laparoscopic gastric sleeve resection (N/A, 04/05/2019). Family History: Patient family history includes Depression in her mother; Diabetes in her brother, mother, and paternal grandmother; Heart attack in her father; Heart disease in her father and maternal grandfather; Hyperlipidemia in her father; Hypertension in her father; Obesity in her father and mother; Sleep apnea in her mother. Social History:  Patient  reports that she has never smoked. She has never used smokeless tobacco. She reports that she does not drink alcohol and does not use drugs.  Review of Systems: Constitutional: negative for fever or malaise Ophthalmic: negative for photophobia, double vision or loss of vision Cardiovascular: negative for chest  pain, dyspnea on exertion, or new LE swelling Respiratory: negative for SOB or persistent cough Gastrointestinal: negative for abdominal pain, change in bowel habits or melena Genitourinary: negative for dysuria or gross hematuria, no abnormal uterine bleeding or disharge Musculoskeletal: negative for new gait disturbance or muscular weakness Integumentary: negative for new or persistent rashes, no breast lumps Neurological: negative for TIA or stroke symptoms Psychiatric: negative for SI or delusions Allergic/Immunologic: negative for hives  Patient Care Team    Relationship Specialty Notifications Start End  Leamon Arnt, MD PCP - General Family Medicine  05/08/21   Starlyn Skeans, MD  Consulting Physician Bariatrics  07/11/17   Warden Fillers, MD Consulting Physician Ophthalmology  04/23/18   Greer Pickerel, MD Consulting Physician General Surgery  07/16/18    Comment: bariatric     Objective  Vitals: BP 100/70   Pulse 80   Temp 98.3 F (36.8 C)   Ht '5\' 7"'  (1.702 m)   Wt 237 lb 9.6 oz (107.8 kg)   SpO2 98%   BMI 37.21 kg/m  General:  Well developed, well nourished, no acute distress  Psych:  Alert and orientedx3,normal mood and affect HEENT:  Normocephalic, atraumatic, non-icteric sclera,  supple neck without adenopathy, mass or thyromegaly Cardiovascular:  Normal S1, S2, RRR without gallop, rub or murmur Respiratory:  Good breath sounds bilaterally, CTAB with normal respiratory effort Gastrointestinal: normal bowel sounds, soft, non-tender, no noted masses. No HSM MSK: no deformities, contusions. Joints are without erythema or swelling.  Skin:  Warm, no rashes or suspicious lesions noted Neurologic:    Mental status is normal. CN 2-11 are normal. Gross motor and sensory exams are normal. Normal gait. No tremor, vertigo with head movement and positional changes.  Commons side effects, risks, benefits, and alternatives for medications and treatment plan prescribed today were discussed, and the patient expressed understanding of the given instructions. Patient is instructed to call or message via MyChart if he/she has any questions or concerns regarding our treatment plan. No barriers to understanding were identified. We discussed Red Flag symptoms and signs in detail. Patient expressed understanding regarding what to do in case of urgent or emergency type symptoms.  Medication list was reconciled, printed and provided to the patient in AVS. Patient instructions and summary information was reviewed with the patient as documented in the AVS. This note was prepared with assistance of Dragon voice recognition software. Occasional wrong-word or sound-a-like substitutions  may have occurred due to the inherent limitations of voice recognition software  This visit occurred during the SARS-CoV-2 public health emergency.  Safety protocols were in place, including screening questions prior to the visit, additional usage of staff PPE, and extensive cleaning of exam room while observing appropriate contact time as indicated for disinfecting solutions.

## 2022-01-29 NOTE — Patient Instructions (Signed)
Please return in 12 months for your annual complete physical; please come fasting.   I will release your lab results to you on your MyChart account with further instructions. You may see the results before I do, but when I review them I will send you a message with my report or have my assistant call you if things need to be discussed. Please reply to my message with any questions. Thank you!   If you have any questions or concerns, please don't hesitate to send me a message via MyChart or call the office at [phone]. Thank you for visiting with [name] today! It's our pleasure caring for you.   Benign Positional Vertigo Vertigo is the feeling that you or your surroundings are moving when they are not. Benign positional vertigo is the most common form of vertigo. This is usually a harmless condition (benign). This condition is positional. This means that symptoms are triggered by certain movements and positions. This condition can be dangerous if it occurs while you are doing something that could cause harm to yourself or others. This includes activities such as driving or operating machinery. What are the causes? The inner ear has fluid-filled canals that help your brain sense movement and balance. When the fluid moves, the brain receives messages about your body's position. With benign positional vertigo, calcium crystals in the inner ear break free and disturb the inner ear area. This causes your brain to receive confusing messages about your body's position. What increases the risk? You are more likely to develop this condition if: You are a woman. You are 42 years of age or older. You have recently had a head injury. You have an inner ear disease. What are the signs or symptoms? Symptoms of this condition usually happen when you move your head or your eyes in different directions. Symptoms may start suddenly and usually last for less than a minute. They include: Loss of balance and  falling. Feeling like you are spinning or moving. Feeling like your surroundings are spinning or moving. Nausea and vomiting. Blurred vision. Dizziness. Involuntary eye movement (nystagmus). Symptoms can be mild and cause only minor problems, or they can be severe and interfere with daily life. Episodes of benign positional vertigo may return (recur) over time. Symptoms may also improve over time. How is this diagnosed? This condition may be diagnosed based on: Your medical history. A physical exam of the head, neck, and ears. Positional tests to check for or stimulate vertigo. You may be asked to turn your head and change positions, such as going from sitting to lying down. A health care provider will watch for symptoms of vertigo. You may be referred to a health care provider who specializes in ear, nose, and throat problems (ENT or otolaryngologist) or a provider who specializes in disorders of the nervous system (neurologist). How is this treated?  This condition may be treated in a session in which your health care provider moves your head in specific positions to help the displaced crystals in your inner ear move. Treatment for this condition may take several sessions. Surgery may be needed in severe cases, but this is rare. In some cases, benign positional vertigo may resolve on its own in 2-4 weeks. Follow these instructions at home: Safety Move slowly. Avoid sudden body or head movements or certain positions, as told by your health care provider. Avoid driving or operating machinery until your health care provider says it is safe. Avoid doing any tasks that would be  dangerous to you or others if vertigo occurs. If you have trouble walking or keeping your balance, try using a cane for stability. If you feel dizzy or unstable, sit down right away. Return to your normal activities as told by your health care provider. Ask your health care provider what activities are safe for  you. General instructions Take over-the-counter and prescription medicines only as told by your health care provider. Drink enough fluid to keep your urine pale yellow. Keep all follow-up visits. This is important. Contact a health care provider if: You have a fever. Your condition gets worse or you develop new symptoms. Your family or friends notice any behavioral changes. You have nausea or vomiting that gets worse. You have numbness or a prickling and tingling sensation. Get help right away if you: Have difficulty speaking or moving. Are always dizzy or faint. Develop severe headaches. Have weakness in your legs or arms. Have changes in your hearing or vision. Develop a stiff neck. Develop sensitivity to light. These symptoms may represent a serious problem that is an emergency. Do not wait to see if the symptoms will go away. Get medical help right away. Call your local emergency services (911 in the [place] Do not drive yourself to the hospital. Summary Vertigo is the feeling that you or your surroundings are moving when they are not. Benign positional vertigo is the most common form of vertigo. This condition is caused by calcium crystals in the inner ear that become displaced. This causes a disturbance in an area of the inner ear that helps your brain sense movement and balance. Symptoms include loss of balance and falling, feeling that you or your surroundings are moving, nausea and vomiting, and blurred vision. This condition can be diagnosed based on symptoms, a physical exam, and positional tests. Follow safety instructions as told by your health care provider and keep all follow-up visits. This is important. This information is not intended to replace advice given to you by your health care provider. Make sure you discuss any questions you have with your health care provider. Document Revised: 06/06/2020 Document Reviewed: 06/06/2020 Elsevier Patient Education  [employer].

## 2022-01-30 ENCOUNTER — Encounter: Payer: Self-pay | Admitting: Family Medicine

## 2022-02-06 ENCOUNTER — Other Ambulatory Visit (HOSPITAL_COMMUNITY): Payer: Self-pay

## 2022-02-12 ENCOUNTER — Encounter: Payer: 59 | Admitting: Physician Assistant

## 2022-02-13 ENCOUNTER — Other Ambulatory Visit (HOSPITAL_BASED_OUTPATIENT_CLINIC_OR_DEPARTMENT_OTHER): Payer: Self-pay

## 2022-02-25 DIAGNOSIS — Z6836 Body mass index (BMI) 36.0-36.9, adult: Secondary | ICD-10-CM | POA: Diagnosis not present

## 2022-02-25 DIAGNOSIS — E039 Hypothyroidism, unspecified: Secondary | ICD-10-CM | POA: Diagnosis not present

## 2022-02-25 DIAGNOSIS — F418 Other specified anxiety disorders: Secondary | ICD-10-CM | POA: Diagnosis not present

## 2022-02-25 DIAGNOSIS — E118 Type 2 diabetes mellitus with unspecified complications: Secondary | ICD-10-CM | POA: Diagnosis not present

## 2022-02-25 DIAGNOSIS — E669 Obesity, unspecified: Secondary | ICD-10-CM | POA: Diagnosis not present

## 2022-02-26 ENCOUNTER — Other Ambulatory Visit (HOSPITAL_BASED_OUTPATIENT_CLINIC_OR_DEPARTMENT_OTHER): Payer: Self-pay

## 2022-02-26 ENCOUNTER — Encounter (INDEPENDENT_AMBULATORY_CARE_PROVIDER_SITE_OTHER): Payer: Self-pay

## 2022-02-26 MED ORDER — MOUNJARO 2.5 MG/0.5ML ~~LOC~~ SOAJ
SUBCUTANEOUS | 0 refills | Status: DC
  Filled 2022-03-10: qty 6, 84d supply, fill #0

## 2022-03-10 ENCOUNTER — Encounter (HOSPITAL_BASED_OUTPATIENT_CLINIC_OR_DEPARTMENT_OTHER): Payer: Self-pay

## 2022-03-10 ENCOUNTER — Other Ambulatory Visit (HOSPITAL_BASED_OUTPATIENT_CLINIC_OR_DEPARTMENT_OTHER): Payer: Self-pay

## 2022-03-11 ENCOUNTER — Other Ambulatory Visit (HOSPITAL_BASED_OUTPATIENT_CLINIC_OR_DEPARTMENT_OTHER): Payer: Self-pay

## 2022-03-28 ENCOUNTER — Other Ambulatory Visit (HOSPITAL_BASED_OUTPATIENT_CLINIC_OR_DEPARTMENT_OTHER): Payer: Self-pay

## 2022-03-31 ENCOUNTER — Other Ambulatory Visit (HOSPITAL_BASED_OUTPATIENT_CLINIC_OR_DEPARTMENT_OTHER): Payer: Self-pay

## 2022-03-31 MED ORDER — MOUNJARO 5 MG/0.5ML ~~LOC~~ SOAJ
5.0000 mg | SUBCUTANEOUS | 3 refills | Status: DC
  Filled 2022-03-31 – 2022-05-19 (×4): qty 2, 28d supply, fill #0
  Filled 2022-08-08: qty 2, 28d supply, fill #1
  Filled 2022-09-09 (×2): qty 2, 28d supply, fill #2
  Filled 2023-01-14: qty 2, 28d supply, fill #3

## 2022-04-14 ENCOUNTER — Encounter: Payer: Self-pay | Admitting: *Deleted

## 2022-04-28 ENCOUNTER — Other Ambulatory Visit (HOSPITAL_BASED_OUTPATIENT_CLINIC_OR_DEPARTMENT_OTHER): Payer: Self-pay

## 2022-04-29 ENCOUNTER — Other Ambulatory Visit (HOSPITAL_BASED_OUTPATIENT_CLINIC_OR_DEPARTMENT_OTHER): Payer: Self-pay

## 2022-05-01 DIAGNOSIS — E119 Type 2 diabetes mellitus without complications: Secondary | ICD-10-CM | POA: Diagnosis not present

## 2022-05-01 DIAGNOSIS — F411 Generalized anxiety disorder: Secondary | ICD-10-CM | POA: Diagnosis not present

## 2022-05-01 DIAGNOSIS — Z6835 Body mass index (BMI) 35.0-35.9, adult: Secondary | ICD-10-CM | POA: Diagnosis not present

## 2022-05-01 DIAGNOSIS — K760 Fatty (change of) liver, not elsewhere classified: Secondary | ICD-10-CM | POA: Diagnosis not present

## 2022-05-03 ENCOUNTER — Other Ambulatory Visit (HOSPITAL_BASED_OUTPATIENT_CLINIC_OR_DEPARTMENT_OTHER): Payer: Self-pay

## 2022-05-05 ENCOUNTER — Other Ambulatory Visit (HOSPITAL_BASED_OUTPATIENT_CLINIC_OR_DEPARTMENT_OTHER): Payer: Self-pay

## 2022-05-09 DIAGNOSIS — Z309 Encounter for contraceptive management, unspecified: Secondary | ICD-10-CM | POA: Diagnosis not present

## 2022-05-12 ENCOUNTER — Other Ambulatory Visit (HOSPITAL_BASED_OUTPATIENT_CLINIC_OR_DEPARTMENT_OTHER): Payer: Self-pay

## 2022-05-12 MED ORDER — VENLAFAXINE HCL ER 37.5 MG PO CP24
37.5000 mg | ORAL_CAPSULE | Freq: Every day | ORAL | 0 refills | Status: DC
  Filled 2022-05-12: qty 90, 90d supply, fill #0

## 2022-05-12 MED ORDER — MOUNJARO 5 MG/0.5ML ~~LOC~~ SOAJ
5.0000 mg | SUBCUTANEOUS | 0 refills | Status: DC
  Filled 2022-05-12: qty 6, 84d supply, fill #0
  Filled 2022-06-12: qty 2, 28d supply, fill #0
  Filled 2022-06-30 – 2022-10-13 (×2): qty 2, 28d supply, fill #1
  Filled 2022-11-18: qty 2, 28d supply, fill #2

## 2022-05-14 ENCOUNTER — Other Ambulatory Visit (HOSPITAL_BASED_OUTPATIENT_CLINIC_OR_DEPARTMENT_OTHER): Payer: Self-pay

## 2022-05-19 ENCOUNTER — Other Ambulatory Visit (HOSPITAL_BASED_OUTPATIENT_CLINIC_OR_DEPARTMENT_OTHER): Payer: Self-pay

## 2022-05-27 DIAGNOSIS — M9901 Segmental and somatic dysfunction of cervical region: Secondary | ICD-10-CM | POA: Diagnosis not present

## 2022-05-27 DIAGNOSIS — M9902 Segmental and somatic dysfunction of thoracic region: Secondary | ICD-10-CM | POA: Diagnosis not present

## 2022-05-27 DIAGNOSIS — M542 Cervicalgia: Secondary | ICD-10-CM | POA: Diagnosis not present

## 2022-05-27 DIAGNOSIS — M5032 Other cervical disc degeneration, mid-cervical region, unspecified level: Secondary | ICD-10-CM | POA: Diagnosis not present

## 2022-05-29 DIAGNOSIS — M5032 Other cervical disc degeneration, mid-cervical region, unspecified level: Secondary | ICD-10-CM | POA: Diagnosis not present

## 2022-05-29 DIAGNOSIS — M542 Cervicalgia: Secondary | ICD-10-CM | POA: Diagnosis not present

## 2022-05-29 DIAGNOSIS — M9901 Segmental and somatic dysfunction of cervical region: Secondary | ICD-10-CM | POA: Diagnosis not present

## 2022-05-29 DIAGNOSIS — M9902 Segmental and somatic dysfunction of thoracic region: Secondary | ICD-10-CM | POA: Diagnosis not present

## 2022-06-02 DIAGNOSIS — M5032 Other cervical disc degeneration, mid-cervical region, unspecified level: Secondary | ICD-10-CM | POA: Diagnosis not present

## 2022-06-02 DIAGNOSIS — M542 Cervicalgia: Secondary | ICD-10-CM | POA: Diagnosis not present

## 2022-06-02 DIAGNOSIS — M9901 Segmental and somatic dysfunction of cervical region: Secondary | ICD-10-CM | POA: Diagnosis not present

## 2022-06-02 DIAGNOSIS — M9902 Segmental and somatic dysfunction of thoracic region: Secondary | ICD-10-CM | POA: Diagnosis not present

## 2022-06-09 DIAGNOSIS — M542 Cervicalgia: Secondary | ICD-10-CM | POA: Diagnosis not present

## 2022-06-09 DIAGNOSIS — M5032 Other cervical disc degeneration, mid-cervical region, unspecified level: Secondary | ICD-10-CM | POA: Diagnosis not present

## 2022-06-09 DIAGNOSIS — M9901 Segmental and somatic dysfunction of cervical region: Secondary | ICD-10-CM | POA: Diagnosis not present

## 2022-06-09 DIAGNOSIS — M9902 Segmental and somatic dysfunction of thoracic region: Secondary | ICD-10-CM | POA: Diagnosis not present

## 2022-06-13 ENCOUNTER — Other Ambulatory Visit (HOSPITAL_BASED_OUTPATIENT_CLINIC_OR_DEPARTMENT_OTHER): Payer: Self-pay

## 2022-06-16 DIAGNOSIS — M542 Cervicalgia: Secondary | ICD-10-CM | POA: Diagnosis not present

## 2022-06-16 DIAGNOSIS — M5032 Other cervical disc degeneration, mid-cervical region, unspecified level: Secondary | ICD-10-CM | POA: Diagnosis not present

## 2022-06-16 DIAGNOSIS — M9902 Segmental and somatic dysfunction of thoracic region: Secondary | ICD-10-CM | POA: Diagnosis not present

## 2022-06-16 DIAGNOSIS — M9901 Segmental and somatic dysfunction of cervical region: Secondary | ICD-10-CM | POA: Diagnosis not present

## 2022-06-20 ENCOUNTER — Other Ambulatory Visit: Payer: Self-pay | Admitting: Family Medicine

## 2022-06-20 ENCOUNTER — Ambulatory Visit
Admission: RE | Admit: 2022-06-20 | Discharge: 2022-06-20 | Disposition: A | Discharge status: Home / Self Care | Payer: 59 | Source: Ambulatory Visit | Attending: Family Medicine | Admitting: Family Medicine

## 2022-06-20 DIAGNOSIS — R921 Mammographic calcification found on diagnostic imaging of breast: Secondary | ICD-10-CM

## 2022-06-24 DIAGNOSIS — M9901 Segmental and somatic dysfunction of cervical region: Secondary | ICD-10-CM | POA: Diagnosis not present

## 2022-06-24 DIAGNOSIS — M9902 Segmental and somatic dysfunction of thoracic region: Secondary | ICD-10-CM | POA: Diagnosis not present

## 2022-06-24 DIAGNOSIS — M5032 Other cervical disc degeneration, mid-cervical region, unspecified level: Secondary | ICD-10-CM | POA: Diagnosis not present

## 2022-06-24 DIAGNOSIS — M542 Cervicalgia: Secondary | ICD-10-CM | POA: Diagnosis not present

## 2022-06-30 ENCOUNTER — Other Ambulatory Visit (HOSPITAL_BASED_OUTPATIENT_CLINIC_OR_DEPARTMENT_OTHER): Payer: Self-pay

## 2022-06-30 ENCOUNTER — Ambulatory Visit
Admission: RE | Admit: 2022-06-30 | Discharge: 2022-06-30 | Disposition: A | Discharge status: Home / Self Care | Payer: 59 | Source: Ambulatory Visit | Attending: Family Medicine | Admitting: Family Medicine

## 2022-06-30 DIAGNOSIS — R921 Mammographic calcification found on diagnostic imaging of breast: Secondary | ICD-10-CM | POA: Diagnosis not present

## 2022-06-30 DIAGNOSIS — D242 Benign neoplasm of left breast: Secondary | ICD-10-CM | POA: Diagnosis not present

## 2022-06-30 HISTORY — PX: BREAST BIOPSY: SHX20

## 2022-07-01 DIAGNOSIS — M9901 Segmental and somatic dysfunction of cervical region: Secondary | ICD-10-CM | POA: Diagnosis not present

## 2022-07-01 DIAGNOSIS — M542 Cervicalgia: Secondary | ICD-10-CM | POA: Diagnosis not present

## 2022-07-01 DIAGNOSIS — M9902 Segmental and somatic dysfunction of thoracic region: Secondary | ICD-10-CM | POA: Diagnosis not present

## 2022-07-01 DIAGNOSIS — M5032 Other cervical disc degeneration, mid-cervical region, unspecified level: Secondary | ICD-10-CM | POA: Diagnosis not present

## 2022-07-03 ENCOUNTER — Encounter: Payer: Self-pay | Admitting: *Deleted

## 2022-07-03 DIAGNOSIS — Z9884 Bariatric surgery status: Secondary | ICD-10-CM | POA: Diagnosis not present

## 2022-07-03 DIAGNOSIS — E282 Polycystic ovarian syndrome: Secondary | ICD-10-CM | POA: Diagnosis not present

## 2022-07-03 DIAGNOSIS — E119 Type 2 diabetes mellitus without complications: Secondary | ICD-10-CM | POA: Diagnosis not present

## 2022-07-03 DIAGNOSIS — E669 Obesity, unspecified: Secondary | ICD-10-CM | POA: Diagnosis not present

## 2022-07-03 DIAGNOSIS — E1169 Type 2 diabetes mellitus with other specified complication: Secondary | ICD-10-CM | POA: Diagnosis not present

## 2022-07-03 DIAGNOSIS — E039 Hypothyroidism, unspecified: Secondary | ICD-10-CM | POA: Diagnosis not present

## 2022-07-03 DIAGNOSIS — Z6834 Body mass index (BMI) 34.0-34.9, adult: Secondary | ICD-10-CM | POA: Diagnosis not present

## 2022-07-04 ENCOUNTER — Other Ambulatory Visit (HOSPITAL_BASED_OUTPATIENT_CLINIC_OR_DEPARTMENT_OTHER): Payer: Self-pay

## 2022-07-04 MED ORDER — MOUNJARO 5 MG/0.5ML ~~LOC~~ SOAJ
5.0000 mg | SUBCUTANEOUS | 3 refills | Status: DC
  Filled 2022-07-04 – 2022-12-15 (×2): qty 2, 28d supply, fill #0
  Filled 2023-01-28 – 2023-02-11 (×2): qty 2, 28d supply, fill #1

## 2022-07-08 DIAGNOSIS — M542 Cervicalgia: Secondary | ICD-10-CM | POA: Diagnosis not present

## 2022-07-08 DIAGNOSIS — M9902 Segmental and somatic dysfunction of thoracic region: Secondary | ICD-10-CM | POA: Diagnosis not present

## 2022-07-08 DIAGNOSIS — M5032 Other cervical disc degeneration, mid-cervical region, unspecified level: Secondary | ICD-10-CM | POA: Diagnosis not present

## 2022-07-08 DIAGNOSIS — M9901 Segmental and somatic dysfunction of cervical region: Secondary | ICD-10-CM | POA: Diagnosis not present

## 2022-07-10 ENCOUNTER — Other Ambulatory Visit (HOSPITAL_BASED_OUTPATIENT_CLINIC_OR_DEPARTMENT_OTHER): Payer: Self-pay

## 2022-07-10 DIAGNOSIS — Z30432 Encounter for removal of intrauterine contraceptive device: Secondary | ICD-10-CM | POA: Diagnosis not present

## 2022-07-10 DIAGNOSIS — Z3202 Encounter for pregnancy test, result negative: Secondary | ICD-10-CM | POA: Diagnosis not present

## 2022-07-10 DIAGNOSIS — Z5309 Procedure and treatment not carried out because of other contraindication: Secondary | ICD-10-CM | POA: Diagnosis not present

## 2022-07-10 DIAGNOSIS — Z30430 Encounter for insertion of intrauterine contraceptive device: Secondary | ICD-10-CM | POA: Diagnosis not present

## 2022-07-10 DIAGNOSIS — Z309 Encounter for contraceptive management, unspecified: Secondary | ICD-10-CM | POA: Diagnosis not present

## 2022-07-10 MED ORDER — JUNEL FE 24 1-20 MG-MCG(24) PO TABS
1.0000 | ORAL_TABLET | Freq: Every day | ORAL | 2 refills | Status: DC
  Filled 2022-07-10: qty 84, 84d supply, fill #0
  Filled 2022-09-09: qty 84, 84d supply, fill #1
  Filled 2022-12-15: qty 84, 84d supply, fill #2

## 2022-07-22 DIAGNOSIS — M9902 Segmental and somatic dysfunction of thoracic region: Secondary | ICD-10-CM | POA: Diagnosis not present

## 2022-07-22 DIAGNOSIS — M9901 Segmental and somatic dysfunction of cervical region: Secondary | ICD-10-CM | POA: Diagnosis not present

## 2022-07-22 DIAGNOSIS — M542 Cervicalgia: Secondary | ICD-10-CM | POA: Diagnosis not present

## 2022-07-22 DIAGNOSIS — M5032 Other cervical disc degeneration, mid-cervical region, unspecified level: Secondary | ICD-10-CM | POA: Diagnosis not present

## 2022-07-30 ENCOUNTER — Other Ambulatory Visit (HOSPITAL_COMMUNITY): Payer: Self-pay

## 2022-08-05 DIAGNOSIS — M9902 Segmental and somatic dysfunction of thoracic region: Secondary | ICD-10-CM | POA: Diagnosis not present

## 2022-08-05 DIAGNOSIS — M9901 Segmental and somatic dysfunction of cervical region: Secondary | ICD-10-CM | POA: Diagnosis not present

## 2022-08-05 DIAGNOSIS — M542 Cervicalgia: Secondary | ICD-10-CM | POA: Diagnosis not present

## 2022-08-05 DIAGNOSIS — M5032 Other cervical disc degeneration, mid-cervical region, unspecified level: Secondary | ICD-10-CM | POA: Diagnosis not present

## 2022-08-08 ENCOUNTER — Other Ambulatory Visit (HOSPITAL_BASED_OUTPATIENT_CLINIC_OR_DEPARTMENT_OTHER): Payer: Self-pay

## 2022-08-19 DIAGNOSIS — E282 Polycystic ovarian syndrome: Secondary | ICD-10-CM | POA: Diagnosis not present

## 2022-08-19 DIAGNOSIS — M9902 Segmental and somatic dysfunction of thoracic region: Secondary | ICD-10-CM | POA: Diagnosis not present

## 2022-08-19 DIAGNOSIS — M5032 Other cervical disc degeneration, mid-cervical region, unspecified level: Secondary | ICD-10-CM | POA: Diagnosis not present

## 2022-08-19 DIAGNOSIS — G4709 Other insomnia: Secondary | ICD-10-CM | POA: Diagnosis not present

## 2022-08-19 DIAGNOSIS — M542 Cervicalgia: Secondary | ICD-10-CM | POA: Diagnosis not present

## 2022-08-19 DIAGNOSIS — F5081 Binge eating disorder: Secondary | ICD-10-CM | POA: Diagnosis not present

## 2022-08-19 DIAGNOSIS — Z6834 Body mass index (BMI) 34.0-34.9, adult: Secondary | ICD-10-CM | POA: Diagnosis not present

## 2022-08-19 DIAGNOSIS — E669 Obesity, unspecified: Secondary | ICD-10-CM | POA: Diagnosis not present

## 2022-08-19 DIAGNOSIS — M9901 Segmental and somatic dysfunction of cervical region: Secondary | ICD-10-CM | POA: Diagnosis not present

## 2022-08-19 DIAGNOSIS — F411 Generalized anxiety disorder: Secondary | ICD-10-CM | POA: Diagnosis not present

## 2022-08-20 ENCOUNTER — Other Ambulatory Visit (HOSPITAL_BASED_OUTPATIENT_CLINIC_OR_DEPARTMENT_OTHER): Payer: Self-pay

## 2022-08-20 MED ORDER — LISDEXAMFETAMINE DIMESYLATE 20 MG PO CAPS
20.0000 mg | ORAL_CAPSULE | Freq: Every morning | ORAL | 0 refills | Status: DC
  Filled 2022-08-20 – 2022-09-02 (×2): qty 30, 30d supply, fill #0

## 2022-08-20 MED ORDER — VENLAFAXINE HCL ER 37.5 MG PO CP24
37.5000 mg | ORAL_CAPSULE | Freq: Every day | ORAL | 0 refills | Status: DC
  Filled 2022-08-20: qty 90, 90d supply, fill #0

## 2022-08-21 ENCOUNTER — Other Ambulatory Visit: Payer: Self-pay

## 2022-08-26 ENCOUNTER — Other Ambulatory Visit (HOSPITAL_BASED_OUTPATIENT_CLINIC_OR_DEPARTMENT_OTHER): Payer: Self-pay

## 2022-08-26 ENCOUNTER — Other Ambulatory Visit: Payer: Self-pay

## 2022-09-02 ENCOUNTER — Other Ambulatory Visit (HOSPITAL_BASED_OUTPATIENT_CLINIC_OR_DEPARTMENT_OTHER): Payer: Self-pay

## 2022-09-09 ENCOUNTER — Other Ambulatory Visit (HOSPITAL_BASED_OUTPATIENT_CLINIC_OR_DEPARTMENT_OTHER): Payer: Self-pay

## 2022-09-09 MED ORDER — AMPHETAMINE-DEXTROAMPHET ER 10 MG PO CP24
10.0000 mg | ORAL_CAPSULE | Freq: Every day | ORAL | 0 refills | Status: DC
  Filled 2022-09-09: qty 30, 30d supply, fill #0

## 2022-09-27 ENCOUNTER — Other Ambulatory Visit (HOSPITAL_BASED_OUTPATIENT_CLINIC_OR_DEPARTMENT_OTHER): Payer: Self-pay

## 2022-10-07 ENCOUNTER — Other Ambulatory Visit (HOSPITAL_BASED_OUTPATIENT_CLINIC_OR_DEPARTMENT_OTHER): Payer: Self-pay

## 2022-10-07 DIAGNOSIS — E669 Obesity, unspecified: Secondary | ICD-10-CM | POA: Diagnosis not present

## 2022-10-07 DIAGNOSIS — Z9884 Bariatric surgery status: Secondary | ICD-10-CM | POA: Diagnosis not present

## 2022-10-07 DIAGNOSIS — Z6832 Body mass index (BMI) 32.0-32.9, adult: Secondary | ICD-10-CM | POA: Diagnosis not present

## 2022-10-07 DIAGNOSIS — Z79899 Other long term (current) drug therapy: Secondary | ICD-10-CM | POA: Diagnosis not present

## 2022-10-07 DIAGNOSIS — F411 Generalized anxiety disorder: Secondary | ICD-10-CM | POA: Diagnosis not present

## 2022-10-07 DIAGNOSIS — E1169 Type 2 diabetes mellitus with other specified complication: Secondary | ICD-10-CM | POA: Diagnosis not present

## 2022-10-07 MED ORDER — DIAZEPAM 5 MG PO TABS
5.0000 mg | ORAL_TABLET | Freq: Every day | ORAL | 0 refills | Status: DC | PRN
  Filled 2022-10-07: qty 5, 5d supply, fill #0

## 2022-10-13 ENCOUNTER — Other Ambulatory Visit (HOSPITAL_BASED_OUTPATIENT_CLINIC_OR_DEPARTMENT_OTHER): Payer: Self-pay

## 2022-10-14 ENCOUNTER — Other Ambulatory Visit (HOSPITAL_BASED_OUTPATIENT_CLINIC_OR_DEPARTMENT_OTHER): Payer: Self-pay

## 2022-10-21 ENCOUNTER — Other Ambulatory Visit (HOSPITAL_BASED_OUTPATIENT_CLINIC_OR_DEPARTMENT_OTHER): Payer: Self-pay

## 2022-10-30 ENCOUNTER — Other Ambulatory Visit (HOSPITAL_COMMUNITY): Payer: Self-pay

## 2022-10-30 ENCOUNTER — Other Ambulatory Visit: Payer: Self-pay | Admitting: Family Medicine

## 2022-10-30 DIAGNOSIS — E039 Hypothyroidism, unspecified: Secondary | ICD-10-CM

## 2022-10-30 MED ORDER — LEVOTHYROXINE SODIUM 112 MCG PO TABS
112.0000 ug | ORAL_TABLET | Freq: Every day | ORAL | 3 refills | Status: DC
  Filled 2022-10-30 – 2022-11-03 (×3): qty 90, 90d supply, fill #0
  Filled 2023-01-14 – 2023-01-28 (×2): qty 90, 90d supply, fill #1
  Filled 2023-04-06 – 2023-04-28 (×2): qty 90, 90d supply, fill #2
  Filled 2023-07-27: qty 90, 90d supply, fill #3

## 2022-11-03 ENCOUNTER — Encounter (HOSPITAL_COMMUNITY): Payer: Self-pay

## 2022-11-03 ENCOUNTER — Other Ambulatory Visit (HOSPITAL_COMMUNITY): Payer: Self-pay

## 2022-11-03 ENCOUNTER — Other Ambulatory Visit (HOSPITAL_BASED_OUTPATIENT_CLINIC_OR_DEPARTMENT_OTHER): Payer: Self-pay

## 2022-11-18 ENCOUNTER — Other Ambulatory Visit (HOSPITAL_BASED_OUTPATIENT_CLINIC_OR_DEPARTMENT_OTHER): Payer: Self-pay

## 2022-11-18 DIAGNOSIS — Z6832 Body mass index (BMI) 32.0-32.9, adult: Secondary | ICD-10-CM | POA: Diagnosis not present

## 2022-11-18 DIAGNOSIS — E669 Obesity, unspecified: Secondary | ICD-10-CM | POA: Diagnosis not present

## 2022-11-18 DIAGNOSIS — Z9884 Bariatric surgery status: Secondary | ICD-10-CM | POA: Diagnosis not present

## 2022-11-18 DIAGNOSIS — F411 Generalized anxiety disorder: Secondary | ICD-10-CM | POA: Diagnosis not present

## 2022-11-18 DIAGNOSIS — E1169 Type 2 diabetes mellitus with other specified complication: Secondary | ICD-10-CM | POA: Diagnosis not present

## 2022-11-19 ENCOUNTER — Other Ambulatory Visit (HOSPITAL_BASED_OUTPATIENT_CLINIC_OR_DEPARTMENT_OTHER): Payer: Self-pay

## 2022-11-19 MED ORDER — MOUNJARO 5 MG/0.5ML ~~LOC~~ SOAJ
5.0000 mg | SUBCUTANEOUS | 3 refills | Status: DC
  Filled 2022-11-19 – 2023-03-09 (×3): qty 2, 28d supply, fill #0
  Filled 2023-04-06: qty 2, 28d supply, fill #1

## 2022-11-20 ENCOUNTER — Other Ambulatory Visit (HOSPITAL_BASED_OUTPATIENT_CLINIC_OR_DEPARTMENT_OTHER): Payer: Self-pay

## 2022-11-21 ENCOUNTER — Other Ambulatory Visit (HOSPITAL_BASED_OUTPATIENT_CLINIC_OR_DEPARTMENT_OTHER): Payer: Self-pay

## 2022-11-29 ENCOUNTER — Other Ambulatory Visit (HOSPITAL_BASED_OUTPATIENT_CLINIC_OR_DEPARTMENT_OTHER): Payer: Self-pay

## 2022-12-06 ENCOUNTER — Other Ambulatory Visit: Payer: Self-pay | Admitting: Family Medicine

## 2022-12-06 DIAGNOSIS — G4709 Other insomnia: Secondary | ICD-10-CM

## 2022-12-08 ENCOUNTER — Other Ambulatory Visit: Payer: Self-pay | Admitting: Family Medicine

## 2022-12-08 ENCOUNTER — Other Ambulatory Visit (HOSPITAL_BASED_OUTPATIENT_CLINIC_OR_DEPARTMENT_OTHER): Payer: Self-pay

## 2022-12-08 DIAGNOSIS — R921 Mammographic calcification found on diagnostic imaging of breast: Secondary | ICD-10-CM

## 2022-12-08 MED ORDER — TRAZODONE HCL 50 MG PO TABS
50.0000 mg | ORAL_TABLET | Freq: Every day | ORAL | 3 refills | Status: DC
  Filled 2022-12-08: qty 90, 90d supply, fill #0
  Filled 2023-02-11 – 2023-02-27 (×2): qty 90, 90d supply, fill #1
  Filled 2023-04-06 – 2023-05-21 (×4): qty 90, 90d supply, fill #2
  Filled 2023-07-05: qty 90, 90d supply, fill #3

## 2022-12-15 ENCOUNTER — Other Ambulatory Visit: Payer: Self-pay

## 2022-12-15 ENCOUNTER — Other Ambulatory Visit (HOSPITAL_BASED_OUTPATIENT_CLINIC_OR_DEPARTMENT_OTHER): Payer: Self-pay

## 2022-12-15 MED ORDER — VENLAFAXINE HCL ER 37.5 MG PO CP24
37.5000 mg | ORAL_CAPSULE | Freq: Every day | ORAL | 0 refills | Status: DC
  Filled 2022-12-15: qty 90, 90d supply, fill #0

## 2022-12-16 ENCOUNTER — Other Ambulatory Visit (HOSPITAL_BASED_OUTPATIENT_CLINIC_OR_DEPARTMENT_OTHER): Payer: Self-pay

## 2022-12-31 ENCOUNTER — Other Ambulatory Visit (HOSPITAL_BASED_OUTPATIENT_CLINIC_OR_DEPARTMENT_OTHER): Payer: Self-pay

## 2022-12-31 DIAGNOSIS — E1169 Type 2 diabetes mellitus with other specified complication: Secondary | ICD-10-CM | POA: Diagnosis not present

## 2022-12-31 DIAGNOSIS — Z6831 Body mass index (BMI) 31.0-31.9, adult: Secondary | ICD-10-CM | POA: Diagnosis not present

## 2022-12-31 DIAGNOSIS — E669 Obesity, unspecified: Secondary | ICD-10-CM | POA: Diagnosis not present

## 2022-12-31 DIAGNOSIS — N926 Irregular menstruation, unspecified: Secondary | ICD-10-CM | POA: Diagnosis not present

## 2022-12-31 DIAGNOSIS — L987 Excessive and redundant skin and subcutaneous tissue: Secondary | ICD-10-CM | POA: Diagnosis not present

## 2022-12-31 DIAGNOSIS — L659 Nonscarring hair loss, unspecified: Secondary | ICD-10-CM | POA: Diagnosis not present

## 2022-12-31 DIAGNOSIS — F411 Generalized anxiety disorder: Secondary | ICD-10-CM | POA: Diagnosis not present

## 2022-12-31 DIAGNOSIS — Z9884 Bariatric surgery status: Secondary | ICD-10-CM | POA: Diagnosis not present

## 2022-12-31 MED ORDER — SPIRONOLACTONE 25 MG PO TABS
25.0000 mg | ORAL_TABLET | Freq: Every day | ORAL | 0 refills | Status: DC
  Filled 2022-12-31: qty 30, 30d supply, fill #0

## 2023-01-01 ENCOUNTER — Other Ambulatory Visit: Payer: Self-pay | Admitting: Family Medicine

## 2023-01-01 DIAGNOSIS — D369 Benign neoplasm, unspecified site: Secondary | ICD-10-CM

## 2023-01-01 DIAGNOSIS — R921 Mammographic calcification found on diagnostic imaging of breast: Secondary | ICD-10-CM

## 2023-01-02 ENCOUNTER — Ambulatory Visit
Admission: RE | Admit: 2023-01-02 | Discharge: 2023-01-02 | Disposition: A | Discharge status: Home / Self Care | Payer: 59 | Source: Ambulatory Visit | Attending: Family Medicine | Admitting: Family Medicine

## 2023-01-02 ENCOUNTER — Ambulatory Visit: Admission: RE | Admit: 2023-01-02 | Payer: 59 | Source: Ambulatory Visit

## 2023-01-02 DIAGNOSIS — R921 Mammographic calcification found on diagnostic imaging of breast: Secondary | ICD-10-CM | POA: Diagnosis not present

## 2023-01-02 DIAGNOSIS — D369 Benign neoplasm, unspecified site: Secondary | ICD-10-CM

## 2023-01-14 ENCOUNTER — Other Ambulatory Visit: Payer: Self-pay

## 2023-01-14 ENCOUNTER — Other Ambulatory Visit (HOSPITAL_BASED_OUTPATIENT_CLINIC_OR_DEPARTMENT_OTHER): Payer: Self-pay

## 2023-01-28 ENCOUNTER — Other Ambulatory Visit (HOSPITAL_BASED_OUTPATIENT_CLINIC_OR_DEPARTMENT_OTHER): Payer: Self-pay

## 2023-01-28 ENCOUNTER — Other Ambulatory Visit: Payer: Self-pay

## 2023-01-28 MED ORDER — SPIRONOLACTONE 25 MG PO TABS
25.0000 mg | ORAL_TABLET | Freq: Every day | ORAL | 0 refills | Status: DC
  Filled 2023-01-28: qty 30, 30d supply, fill #0

## 2023-02-03 ENCOUNTER — Encounter: Payer: Self-pay | Admitting: Family Medicine

## 2023-02-03 ENCOUNTER — Ambulatory Visit: Payer: 59 | Admitting: Family Medicine

## 2023-02-03 ENCOUNTER — Other Ambulatory Visit (HOSPITAL_COMMUNITY): Payer: Self-pay

## 2023-02-03 VITALS — BP 108/62 | HR 87 | Temp 98.1°F | Ht 67.0 in | Wt 199.2 lb

## 2023-02-03 DIAGNOSIS — R7303 Prediabetes: Secondary | ICD-10-CM | POA: Diagnosis not present

## 2023-02-03 DIAGNOSIS — E039 Hypothyroidism, unspecified: Secondary | ICD-10-CM | POA: Diagnosis not present

## 2023-02-03 DIAGNOSIS — F5101 Primary insomnia: Secondary | ICD-10-CM

## 2023-02-03 DIAGNOSIS — H6992 Unspecified Eustachian tube disorder, left ear: Secondary | ICD-10-CM | POA: Diagnosis not present

## 2023-02-03 DIAGNOSIS — Z13220 Encounter for screening for lipoid disorders: Secondary | ICD-10-CM | POA: Diagnosis not present

## 2023-02-03 DIAGNOSIS — F339 Major depressive disorder, recurrent, unspecified: Secondary | ICD-10-CM

## 2023-02-03 DIAGNOSIS — Z23 Encounter for immunization: Secondary | ICD-10-CM | POA: Diagnosis not present

## 2023-02-03 DIAGNOSIS — Z9884 Bariatric surgery status: Secondary | ICD-10-CM

## 2023-02-03 DIAGNOSIS — E559 Vitamin D deficiency, unspecified: Secondary | ICD-10-CM

## 2023-02-03 DIAGNOSIS — Z0000 Encounter for general adult medical examination without abnormal findings: Secondary | ICD-10-CM

## 2023-02-03 DIAGNOSIS — D242 Benign neoplasm of left breast: Secondary | ICD-10-CM

## 2023-02-03 DIAGNOSIS — E538 Deficiency of other specified B group vitamins: Secondary | ICD-10-CM

## 2023-02-03 DIAGNOSIS — E282 Polycystic ovarian syndrome: Secondary | ICD-10-CM

## 2023-02-03 LAB — CBC WITH DIFFERENTIAL/PLATELET
Basophils Absolute: 0.1 10*3/uL (ref 0.0–0.1)
Basophils Relative: 0.8 % (ref 0.0–3.0)
Eosinophils Absolute: 0.1 10*3/uL (ref 0.0–0.7)
Eosinophils Relative: 0.8 % (ref 0.0–5.0)
HCT: 43.6 % (ref 36.0–46.0)
Hemoglobin: 14.7 g/dL (ref 12.0–15.0)
Lymphocytes Relative: 25.4 % (ref 12.0–46.0)
Lymphs Abs: 2.1 10*3/uL (ref 0.7–4.0)
MCHC: 33.7 g/dL (ref 30.0–36.0)
MCV: 87.5 fl (ref 78.0–100.0)
Monocytes Absolute: 0.4 10*3/uL (ref 0.1–1.0)
Monocytes Relative: 5.2 % (ref 3.0–12.0)
Neutro Abs: 5.6 10*3/uL (ref 1.4–7.7)
Neutrophils Relative %: 67.8 % (ref 43.0–77.0)
Platelets: 421 10*3/uL — ABNORMAL HIGH (ref 150.0–400.0)
RBC: 4.98 Mil/uL (ref 3.87–5.11)
RDW: 13.3 % (ref 11.5–15.5)
WBC: 8.2 10*3/uL (ref 4.0–10.5)

## 2023-02-03 LAB — COMPREHENSIVE METABOLIC PANEL
ALT: 14 U/L (ref 0–35)
AST: 12 U/L (ref 0–37)
Albumin: 4.3 g/dL (ref 3.5–5.2)
Alkaline Phosphatase: 54 U/L (ref 39–117)
BUN: 15 mg/dL (ref 6–23)
CO2: 23 mEq/L (ref 19–32)
Calcium: 10 mg/dL (ref 8.4–10.5)
Chloride: 106 mEq/L (ref 96–112)
Creatinine, Ser: 0.77 mg/dL (ref 0.40–1.20)
GFR: 94.56 mL/min (ref >60.00)
Glucose, Bld: 77 mg/dL (ref 70–99)
Potassium: 4 mEq/L (ref 3.5–5.1)
Sodium: 136 mEq/L (ref 135–145)
Total Bilirubin: 0.4 mg/dL (ref 0.2–1.2)
Total Protein: 6.9 g/dL (ref 6.0–8.3)

## 2023-02-03 LAB — MICROALBUMIN / CREATININE URINE RATIO
Creatinine,U: 181.4 mg/dL
Microalb Creat Ratio: 1.4 mg/g (ref 0.0–30.0)
Microalb, Ur: 2.5 mg/dL — ABNORMAL HIGH (ref 0.0–1.9)

## 2023-02-03 LAB — LIPID PANEL
Cholesterol: 173 mg/dL (ref 0–200)
HDL: 42.1 mg/dL (ref >39.00)
LDL Cholesterol: 105 mg/dL — ABNORMAL HIGH (ref 0–99)
NonHDL: 130.6
Total CHOL/HDL Ratio: 4
Triglycerides: 130 mg/dL (ref 0.0–149.0)
VLDL: 26 mg/dL (ref 0.0–40.0)

## 2023-02-03 LAB — HEMOGLOBIN A1C: Hgb A1c MFr Bld: 5.2 % (ref 4.6–6.5)

## 2023-02-03 LAB — VITAMIN D 25 HYDROXY (VIT D DEFICIENCY, FRACTURES): VITD: 32.85 ng/mL (ref 30.00–100.00)

## 2023-02-03 LAB — VITAMIN B12: Vitamin B-12: 449 pg/mL (ref 211–911)

## 2023-02-03 LAB — TSH: TSH: 1.19 u[IU]/mL (ref 0.35–5.50)

## 2023-02-03 MED ORDER — FLUTICASONE PROPIONATE 50 MCG/ACT NA SUSP
1.0000 | Freq: Every day | NASAL | 6 refills | Status: AC
  Filled 2023-02-03 – 2023-02-04 (×2): qty 16, 30d supply, fill #0
  Filled 2023-05-06: qty 16, 30d supply, fill #1

## 2023-02-03 MED ORDER — VENLAFAXINE HCL ER 37.5 MG PO CP24
37.5000 mg | ORAL_CAPSULE | Freq: Every day | ORAL | 3 refills | Status: DC
  Filled 2023-02-03 – 2023-04-06 (×2): qty 90, 90d supply, fill #0
  Filled 2023-08-16: qty 90, 90d supply, fill #1
  Filled 2023-12-13: qty 90, 90d supply, fill #2
  Filled 2024-01-21: qty 90, 90d supply, fill #3

## 2023-02-03 NOTE — Progress Notes (Signed)
See mychart note [facility], Your lab results look good!  Your cholesterol is better than it has been although your LDL is 105.  We typically want this less than 70 in patients with diabetes.  At this time we will continue diet and weight loss and monitor.  At some point, we can again discuss the use of a statin.  Your urine shows a tiny bit of protein leakage and we can follow this.  Sometimes we use a low-dose blood pressure medication to protect the kidneys in diabetics.  Your A1c is now in the very normal range at 5.2 on Mounjaro. We can discuss along with Dr. Earlene Plater if we want to manage you like a full-fledged diabetic or not.  Right now I have not changed any medications.  Your thyroid level is good.  Also your vitamin B12 level has improved so continue taking your B12 supplement daily.  Vitamin D is improving.  Good seeing you!  And congratulations again on the closing of your new home Sincerely, Dr. Mardelle Matte

## 2023-02-03 NOTE — Progress Notes (Signed)
Subjective  Chief Complaint  Patient presents with   Annual Exam    Pt here for Annual exam and is currently not fasting    Hypothyroidism    HPI: Tammy Mooney is a 42 y.o. female who presents to [facility] today for a Female Wellness Visit. She also has the concerns and/or needs as listed above in the chief complaint. These will be addressed in addition to the Health Maintenance Visit.   Wellness Visit: annual visit with health maintenance review and exam without Pap  HM: 42 year old now on oral contraceptives after failed IUD replacement attempt at OB/GYN.  You will call for records.  Patient believes she had Pap smear at that time in December.  Up-to-date on mammograms.  Reviewed biopsy results from December showing papilloma intraductal with hyperplasia.  Most recent mammogram was normal.  Continues to thrive.  Seeing Dr. Earlene Plater for weight loss management.  Eligible for Tdap today. Chronic disease f/u and/or acute problem visit: (deemed necessary to be done in addition to the wellness visit): Prediabetes: Remains on Mounjaro 5 mg daily.  Weight loss continues.  She has history of 1 A1c, 6.6 max.  She is not on a statin nor ACE inhibitor.  She has not had pneumococcal vaccinations.  She feels well.  Diet is good. B12 and vitamin D deficiencies are due for recheck.  She is on supplements. Hypothyroidism on levothyroxine 112 mcg daily.  No symptoms of hyperthyroidism.  Recheck levels today.  She is compliant with medications. PCOS with secondary acne and now noting hair thinning.  Hair thinning likely regarded to weight loss.  Started on spironolactone by Dr. Earlene Plater. Insomnia: Has had poor sleep over the last few weeks.  Is moving, just closed on her house last week.  Mildly distressed but managing well overall.  She realized she was not taking her trazodone the last 3 nights. Complains of hearing a fluttering in her left ear intermittently.  On and off  over the last several months.  No pain.  Admits to PND.  No sneezing.  He has history of fall allergies.  He has been doing allergies in the past with negative testing.  Not currently taking any medications.  Assessment  1. Annual physical exam   2. Prediabetes   3. Primary hypothyroidism   4. S/P laparoscopic sleeve gastrectomy   5. Morbid obesity ([name])   6. Major depression, recurrent, chronic ([name])   7. Vitamin B12 deficiency   8. Vitamin D deficiency   9. Intraductal papilloma of breast, left   10. Need for Tdap vaccination   11. Primary insomnia   12. PCOS (polycystic ovarian syndrome)      Plan  Female Wellness Visit: Age appropriate Health Maintenance and Prevention measures were discussed with patient. Included topics are cancer screening recommendations, ways to keep healthy (see AVS) including dietary and exercise recommendations, regular eye and dental care, use of seat belts, and avoidance of moderate alcohol use and tobacco use.  Will call [facility] for Pap smear results.  Screens are current BMI: discussed patient's BMI and encouraged positive lifestyle modifications to help get to or maintain a target BMI. HM needs and immunizations were addressed and ordered. See below for orders. See HM and immunization section for updates.  Tdap given today Routine labs and screening tests ordered including cmp, cbc and lipids where appropriate. Discussed recommendations regarding Vit D and calcium supplementation (see AVS)  Chronic disease management visit and/or acute problem  visit: Hypothyroidism: Clinically stable.  Check TSH and levothyroxine 112 mcg daily.  May need to adjust down dose with further weight loss. Prediabetes: Has been a borderline diabetic in the past.  Discussed possibilities of adding medications like ACE inhibitor and statin.  Check nonfasting lipids today.  Check urine nephropathy screening.  Continue Mounjaro per Dr. Earlene Plater.  Continue weight loss and diet.   Defers pneumococcal vaccines today. Eustachian tube dysfunction: Likely allergic related.  Start Flonase daily Recheck B12 and vitamin D levels on his oral supplements over-the-counter Depression is well-controlled on Effexor 37.5 mg daily.  Refilled today.  Follow up: 12 months for complete physical Orders Placed This Encounter  Procedures   Tdap vaccine greater than or equal to 7yo IM   CBC with Differential/Platelet   Comprehensive metabolic panel   Lipid panel   Hemoglobin A1c   TSH   Microalbumin / creatinine urine ratio   Vitamin B12   VITAMIN D 25 Hydroxy (Vit-D Deficiency, Fractures)   Meds ordered this encounter  Medications   venlafaxine XR (EFFEXOR-XR) 37.5 MG 24 hr capsule    Sig: Take 1 capsule (37.5 mg total) by mouth daily with food    Dispense:  90 capsule    Refill:  3   fluticasone (FLONASE) 50 MCG/ACT nasal spray    Sig: Place 1 spray into both nostrils daily.    Dispense:  16 g    Refill:  6      Body mass index is 31.2 kg/m. Wt Readings from Last 3 Encounters:  02/03/23 199 lb 3.2 oz (90.4 kg)  01/29/22 237 lb 9.6 oz (107.8 kg)  10/24/21 244 lb (110.7 kg)     Patient Active Problem List   Diagnosis Date Noted Date Diagnosed   Intraductal papilloma of breast, left 02/03/2023     Priority: Medium     Left breast biopsy 06/2022.  Usual hyperplasia.  No malignancy.  Breast center    Goiter 05/08/2021     Priority: Medium     Ultrasound 202: heterogenous, single subcentimeter nodule. No further surveillance.    Prediabetes 05/08/2021     Priority: High   Major depression, recurrent, chronic ([name]) 11/21/2020     Priority: High   S/P laparoscopic sleeve gastrectomy 04/05/2019     Priority: High   Morbid obesity ([name]) 02/09/2017     Priority: High   Primary hypothyroidism 11/13/2015     Priority: High   Chronic pain of right knee 04/22/2018     Priority: Medium     Previous evaluation by Orthopedics. MRI available.    Primary insomnia  09/17/2017     Priority: Medium    Anxiety 11/13/2015     Priority: Medium     Mild. Patient takes Buspar as needed.    PCOS (polycystic ovarian syndrome) 09/25/2011     Priority: Medium    Vitamin B12 deficiency 05/08/2021     Priority: Low   Rosacea 11/21/2020     Priority: Low   Vitamin D deficiency 02/23/2017     Priority: Low    repleted 07/2019    Allergic rhinitis 01/03/2016     Priority: Low    Controlled with Zyrtec.    Health Maintenance  Topic Date Due   Diabetic kidney evaluation - Urine ACR  11/21/2021   Diabetic kidney evaluation - eGFR measurement  01/30/2023   COVID-19 Vaccine (1) 02/19/2023 (Originally 10/18/1985)   INFLUENZA VACCINE  02/19/2023   MAMMOGRAM  01/02/2024   PAP SMEAR-Modifier  06/21/2027   DTaP/Tdap/Td (2 - Td or Tdap) 02/02/2033   Hepatitis C Screening  Completed   HIV Screening  Completed   HPV VACCINES  Aged Out   Immunization History  Administered Date(s) Administered   Influenza,inj,Quad PF,6+ Mos 04/12/2018   Influenza-Unspecified 05/14/2017, 04/21/2019, 05/07/2021   Tdap 02/03/2023   We updated and reviewed the patient's past history in detail and it is documented below. Allergies: Patient is allergic to adhesive [tape] and penicillins. Past Medical History Patient  has a past medical history of Anxiety, Coronavirus infection, Depression, DM (diabetes mellitus), type 2 ([name]), Fatty liver, Hypothyroidism, Infertility, female, Low HDL (under 40), PCOS (polycystic ovarian syndrome), Varicella, and Vertigo. Past Surgical History Patient  has a past surgical history that includes Cesarean section; Tonsillectomy; Eye surgery; Nasal sinus surgery; Cesarean section (07/01/2012); Cesarean section (N/A, 10/25/2014); Mandible surgery (AGE 53); Image guided sinus surgery (N/A, 06/05/2016); Turbinate reduction (Bilateral, 06/05/2016); Sinusotomy (Left, 06/05/2016); Maxillary antrostomy (Right, 06/05/2016); Hardware Removal (Left, 06/05/2016);  Laparoscopic gastric sleeve resection (N/A, 04/05/2019); and Breast biopsy (Left, 06/30/2022). Family History: Patient family history includes Depression in her mother; Diabetes in her brother, mother, and paternal grandmother; Heart attack in her father; Heart disease in her father and maternal grandfather; Hyperlipidemia in her father; Hypertension in her father; Obesity in her father and mother; Sleep apnea in her mother. Social History:  Patient  reports that she has never smoked. She has never used smokeless tobacco. She reports that she does not drink alcohol and does not use drugs.  Review of Systems: Constitutional: negative for fever or malaise Ophthalmic: negative for photophobia, double vision or loss of vision Cardiovascular: negative for chest pain, dyspnea on exertion, or new LE swelling Respiratory: negative for SOB or persistent cough Gastrointestinal: negative for abdominal pain, change in bowel habits or melena Genitourinary: negative for dysuria or gross hematuria, no abnormal uterine bleeding or disharge Musculoskeletal: negative for new gait disturbance or muscular weakness Integumentary: negative for new or persistent rashes, no breast lumps Neurological: negative for TIA or stroke symptoms Psychiatric: negative for SI or delusions Allergic/Immunologic: negative for hives  Patient Care Team    Relationship Specialty Notifications Start End  Willow Ora, MD PCP - General Family Medicine  05/08/21   Wilder Glade, MD Consulting Physician Bariatrics  07/11/17   Sallye Lat, MD Consulting Physician Ophthalmology  04/23/18   Gaynelle Adu, MD Consulting Physician General Surgery  07/16/18    Comment: bariatric     Objective  Vitals: BP 108/62   Pulse 87   Temp 98.1 F (36.7 C)   Ht 5\' 7"  (1.702 m)   Wt 199 lb 3.2 oz (90.4 kg)   SpO2 97%   BMI 31.20 kg/m  General:  Well developed, well nourished, no acute distress  Psych:  Alert and orientedx3,normal  mood and affect HEENT:  Normocephalic, atraumatic, non-icteric sclera,  supple neck without adenopathy, mass or thyromegaly Cardiovascular:  Normal S1, S2, RRR without gallop, rub or murmur Respiratory:  Good breath sounds bilaterally, CTAB with normal respiratory effort Gastrointestinal: normal bowel sounds, soft, non-tender, no noted masses. No HSM MSK: extremities without edema, joints without erythema or swelling Neurologic:    Mental status is normal.  Gross motor and sensory exams are normal.  No tremor  Commons side effects, risks, benefits, and alternatives for medications and treatment plan prescribed today were discussed, and the patient expressed understanding of the given instructions. Patient is instructed to call or message via MyChart if he/she has any  questions or concerns regarding our treatment plan. No barriers to understanding were identified. We discussed Red Flag symptoms and signs in detail. Patient expressed understanding regarding what to do in case of urgent or emergency type symptoms.  Medication list was reconciled, printed and provided to the patient in AVS. Patient instructions and summary information was reviewed with the patient as documented in the AVS. This note was prepared with assistance of Dragon voice recognition software. Occasional wrong-word or sound-a-like substitutions may have occurred due to the inherent limitations of voice recognition software

## 2023-02-04 ENCOUNTER — Other Ambulatory Visit (HOSPITAL_COMMUNITY): Payer: Self-pay

## 2023-02-04 ENCOUNTER — Other Ambulatory Visit (HOSPITAL_BASED_OUTPATIENT_CLINIC_OR_DEPARTMENT_OTHER): Payer: Self-pay

## 2023-02-11 ENCOUNTER — Other Ambulatory Visit: Payer: Self-pay

## 2023-02-11 ENCOUNTER — Other Ambulatory Visit (HOSPITAL_BASED_OUTPATIENT_CLINIC_OR_DEPARTMENT_OTHER): Payer: Self-pay

## 2023-02-11 ENCOUNTER — Other Ambulatory Visit: Payer: Self-pay | Admitting: Oncology

## 2023-02-11 DIAGNOSIS — Z006 Encounter for examination for normal comparison and control in clinical research program: Secondary | ICD-10-CM

## 2023-02-18 DIAGNOSIS — L987 Excessive and redundant skin and subcutaneous tissue: Secondary | ICD-10-CM | POA: Diagnosis not present

## 2023-02-18 DIAGNOSIS — M5489 Other dorsalgia: Secondary | ICD-10-CM | POA: Diagnosis not present

## 2023-02-18 DIAGNOSIS — G8929 Other chronic pain: Secondary | ICD-10-CM | POA: Diagnosis not present

## 2023-02-18 DIAGNOSIS — R632 Polyphagia: Secondary | ICD-10-CM | POA: Diagnosis not present

## 2023-02-18 DIAGNOSIS — E669 Obesity, unspecified: Secondary | ICD-10-CM | POA: Diagnosis not present

## 2023-02-18 DIAGNOSIS — Z6830 Body mass index (BMI) 30.0-30.9, adult: Secondary | ICD-10-CM | POA: Diagnosis not present

## 2023-02-18 DIAGNOSIS — G4709 Other insomnia: Secondary | ICD-10-CM | POA: Diagnosis not present

## 2023-02-18 DIAGNOSIS — E1169 Type 2 diabetes mellitus with other specified complication: Secondary | ICD-10-CM | POA: Diagnosis not present

## 2023-03-09 ENCOUNTER — Other Ambulatory Visit (HOSPITAL_BASED_OUTPATIENT_CLINIC_OR_DEPARTMENT_OTHER): Payer: Self-pay

## 2023-03-10 ENCOUNTER — Other Ambulatory Visit (HOSPITAL_BASED_OUTPATIENT_CLINIC_OR_DEPARTMENT_OTHER): Payer: Self-pay

## 2023-03-10 MED ORDER — SPIRONOLACTONE 25 MG PO TABS
25.0000 mg | ORAL_TABLET | Freq: Every day | ORAL | 2 refills | Status: DC
  Filled 2023-03-10 – 2023-04-06 (×2): qty 30, 30d supply, fill #0
  Filled 2023-05-06: qty 30, 30d supply, fill #1
  Filled 2023-06-16: qty 30, 30d supply, fill #2

## 2023-03-25 ENCOUNTER — Other Ambulatory Visit (HOSPITAL_BASED_OUTPATIENT_CLINIC_OR_DEPARTMENT_OTHER): Payer: Self-pay

## 2023-04-06 ENCOUNTER — Other Ambulatory Visit: Payer: Self-pay

## 2023-04-06 ENCOUNTER — Other Ambulatory Visit (HOSPITAL_BASED_OUTPATIENT_CLINIC_OR_DEPARTMENT_OTHER): Payer: Self-pay

## 2023-04-06 DIAGNOSIS — E65 Localized adiposity: Secondary | ICD-10-CM | POA: Diagnosis not present

## 2023-04-06 DIAGNOSIS — Z6830 Body mass index (BMI) 30.0-30.9, adult: Secondary | ICD-10-CM | POA: Diagnosis not present

## 2023-04-06 DIAGNOSIS — E669 Obesity, unspecified: Secondary | ICD-10-CM | POA: Diagnosis not present

## 2023-04-06 DIAGNOSIS — E282 Polycystic ovarian syndrome: Secondary | ICD-10-CM | POA: Diagnosis not present

## 2023-04-06 DIAGNOSIS — E1169 Type 2 diabetes mellitus with other specified complication: Secondary | ICD-10-CM | POA: Diagnosis not present

## 2023-04-06 MED ORDER — BLISOVI 24 FE 1-20 MG-MCG(24) PO TABS
1.0000 | ORAL_TABLET | Freq: Every day | ORAL | 0 refills | Status: DC
  Filled 2023-04-06: qty 84, 84d supply, fill #0

## 2023-04-28 ENCOUNTER — Other Ambulatory Visit (HOSPITAL_BASED_OUTPATIENT_CLINIC_OR_DEPARTMENT_OTHER): Payer: Self-pay

## 2023-04-28 ENCOUNTER — Other Ambulatory Visit: Payer: Self-pay

## 2023-04-29 ENCOUNTER — Other Ambulatory Visit (HOSPITAL_BASED_OUTPATIENT_CLINIC_OR_DEPARTMENT_OTHER): Payer: Self-pay

## 2023-04-29 MED ORDER — MOUNJARO 7.5 MG/0.5ML ~~LOC~~ SOAJ
7.5000 mg | SUBCUTANEOUS | 1 refills | Status: DC
  Filled 2023-04-29: qty 2, 28d supply, fill #0
  Filled 2023-05-21 – 2023-05-22 (×2): qty 2, 28d supply, fill #1

## 2023-05-06 ENCOUNTER — Other Ambulatory Visit: Payer: Self-pay

## 2023-05-06 ENCOUNTER — Other Ambulatory Visit (HOSPITAL_BASED_OUTPATIENT_CLINIC_OR_DEPARTMENT_OTHER): Payer: Self-pay

## 2023-05-19 DIAGNOSIS — K760 Fatty (change of) liver, not elsewhere classified: Secondary | ICD-10-CM | POA: Diagnosis not present

## 2023-05-19 DIAGNOSIS — R632 Polyphagia: Secondary | ICD-10-CM | POA: Diagnosis not present

## 2023-05-19 DIAGNOSIS — E1169 Type 2 diabetes mellitus with other specified complication: Secondary | ICD-10-CM | POA: Diagnosis not present

## 2023-05-19 DIAGNOSIS — E66811 Obesity, class 1: Secondary | ICD-10-CM | POA: Diagnosis not present

## 2023-05-19 DIAGNOSIS — G4709 Other insomnia: Secondary | ICD-10-CM | POA: Diagnosis not present

## 2023-05-19 DIAGNOSIS — Z6830 Body mass index (BMI) 30.0-30.9, adult: Secondary | ICD-10-CM | POA: Diagnosis not present

## 2023-05-20 ENCOUNTER — Other Ambulatory Visit (HOSPITAL_BASED_OUTPATIENT_CLINIC_OR_DEPARTMENT_OTHER): Payer: Self-pay

## 2023-05-21 ENCOUNTER — Other Ambulatory Visit (HOSPITAL_BASED_OUTPATIENT_CLINIC_OR_DEPARTMENT_OTHER): Payer: Self-pay

## 2023-05-21 ENCOUNTER — Other Ambulatory Visit: Payer: Self-pay

## 2023-06-10 IMAGING — MG MM DIGITAL SCREENING BILAT W/ TOMO AND CAD
8 series · 8 of 24 positions shown · non-contrast
Comparison: None.

CLINICAL DATA: Screening.

EXAM:
DIGITAL SCREENING BILATERAL MAMMOGRAM WITH TOMOSYNTHESIS AND CAD
TECHNIQUE: Bilateral screening digital craniocaudal and mediolateral oblique
mammograms were obtained. Bilateral screening digital breast
tomosynthesis was performed. The images were evaluated with
computer-aided detection.

[L MLO synth-2D]
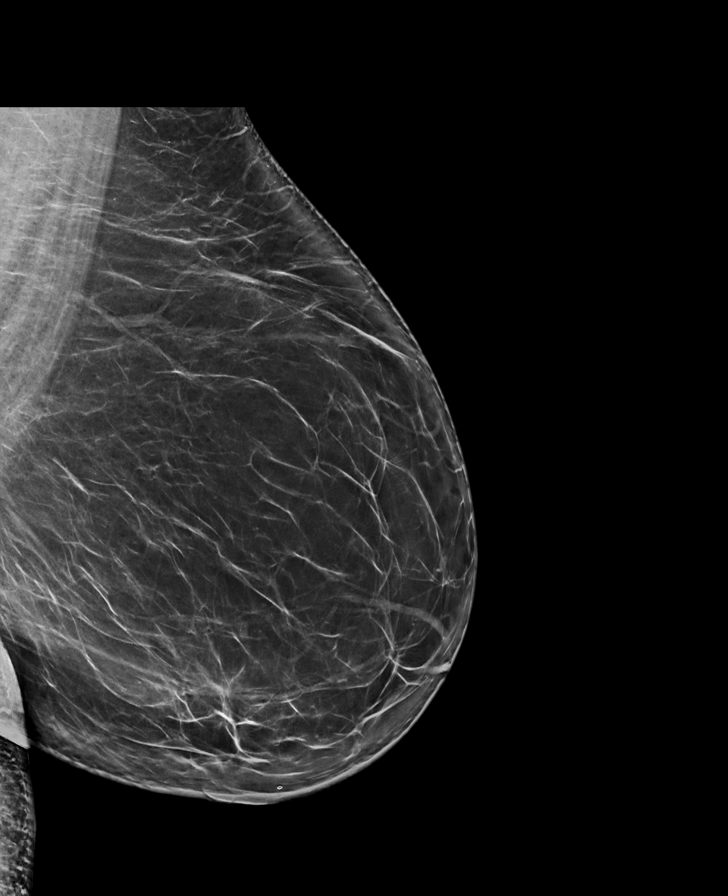

[L CC synth-2D]
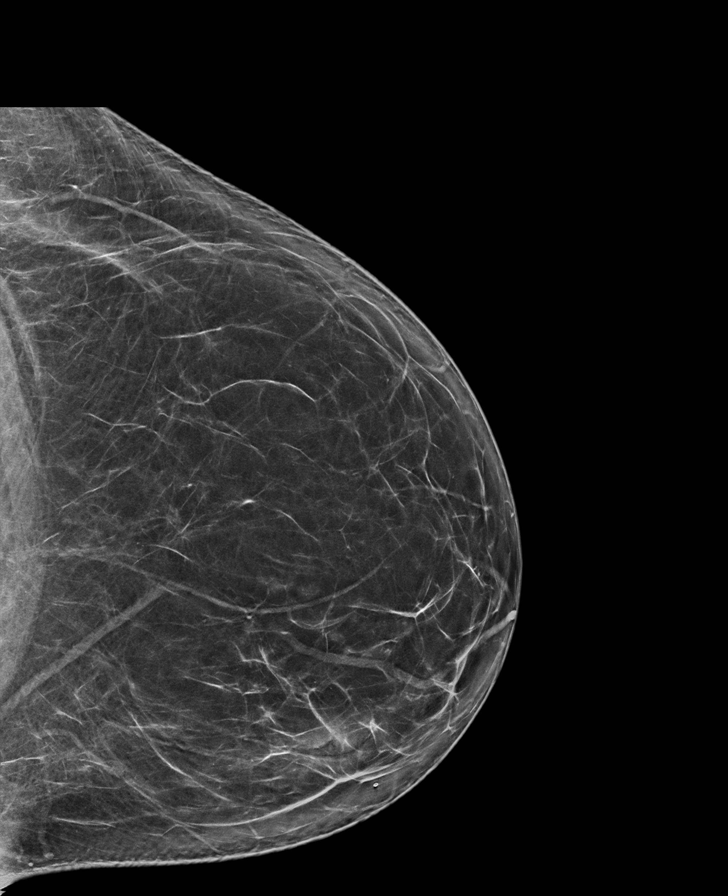

[R MLO synth-2D]
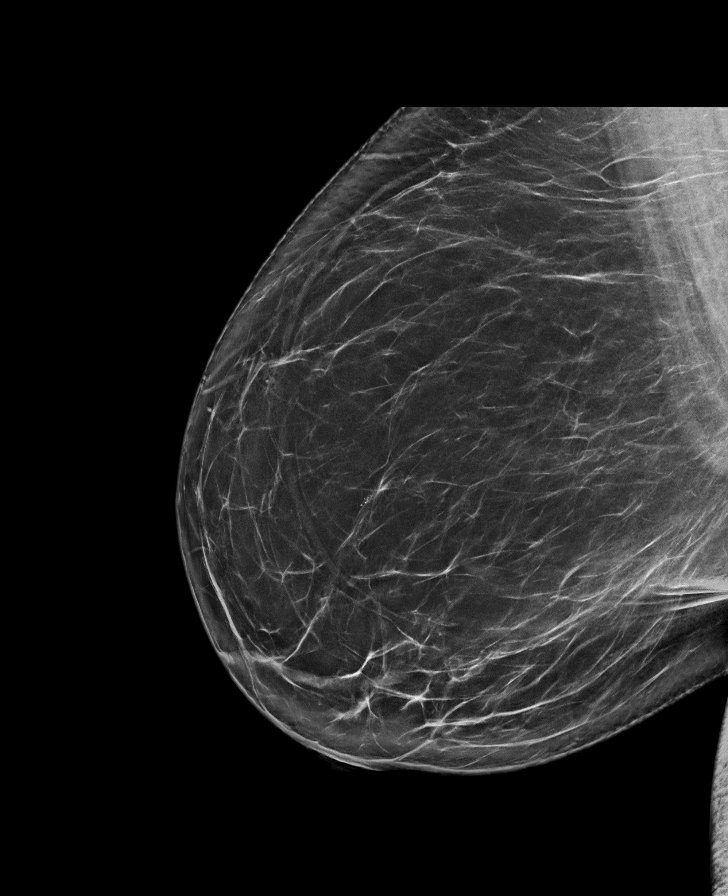

[R CC synth-2D]
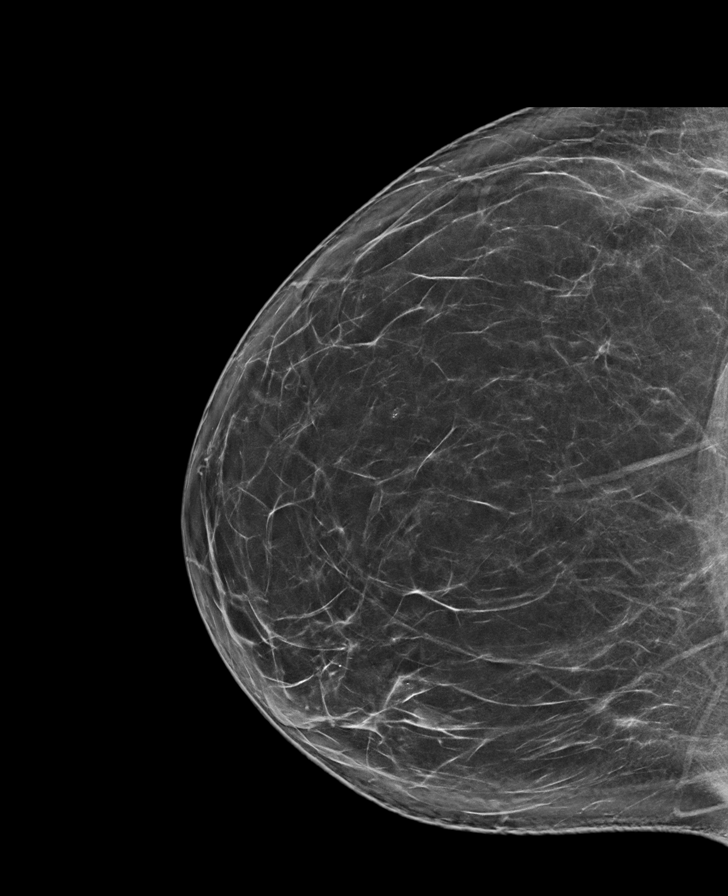

[R CC tomo · tomo slice 44/87.0]
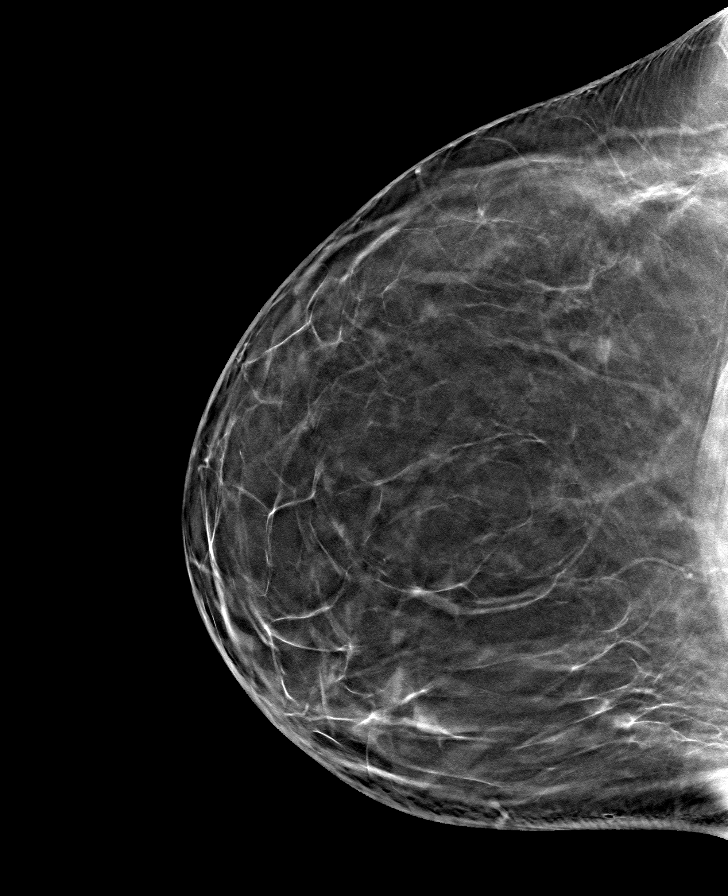

[L MLO tomo · tomo slice 47/93.0]
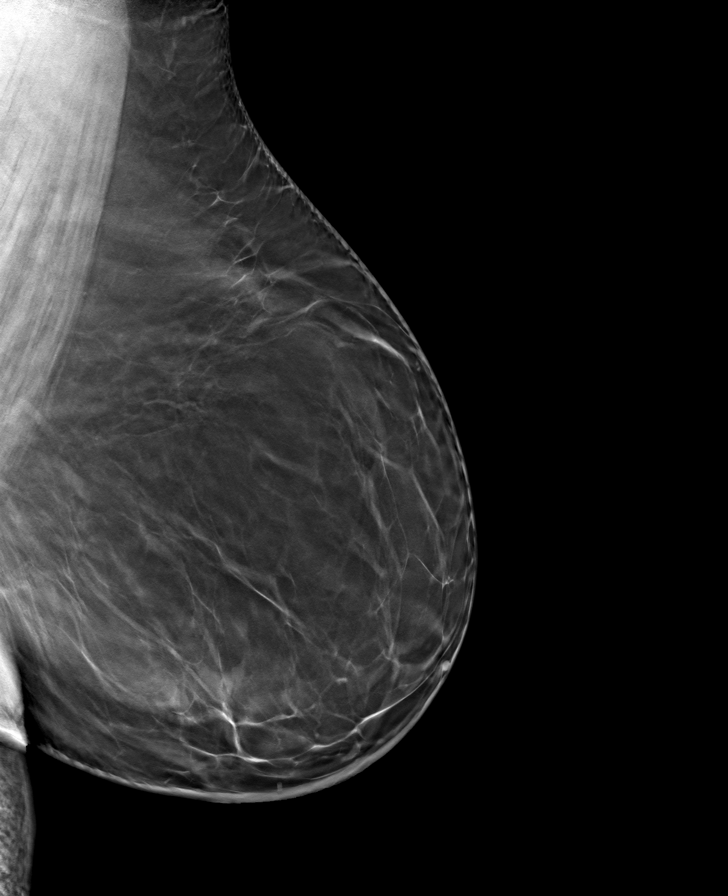

[L CC tomo · tomo slice 43/84.0]
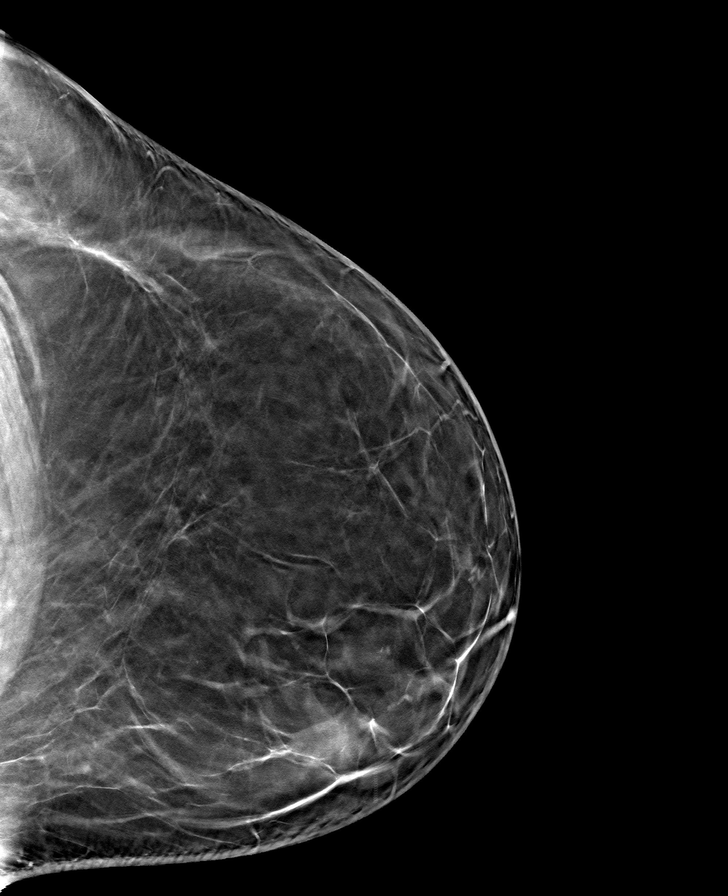

[R MLO tomo · tomo slice 49/96.0]
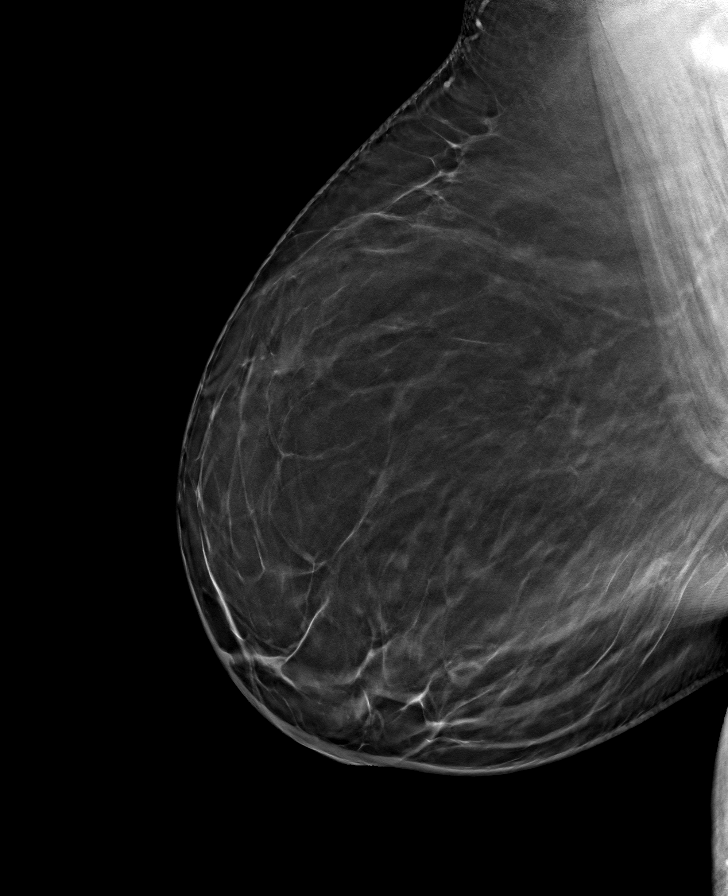

[8 of 24 positions shown; findings below may reference images not displayed]

ACR Breast Density Category b: There are scattered areas of
fibroglandular density.
FINDINGS: In the right breast possible microcalcifications requires further
evaluation.

In the left breast possible mass with associated microcalcifications
requires further evaluation.
IMPRESSION: Further evaluation is suggested for possible microcalcifications in
the right breast.

Further evaluation is suggested for possible mass with associated
microcalcifications in the left breast.

RECOMMENDATION:
Diagnostic mammogram and possibly ultrasound of both breasts.
(Code:[id number])

The patient will be contacted regarding the findings, and additional
imaging will be scheduled.

BI-RADS CATEGORY  0: Incomplete. Need additional imaging evaluation
and/or prior mammograms for comparison.

## 2023-06-16 ENCOUNTER — Other Ambulatory Visit (HOSPITAL_BASED_OUTPATIENT_CLINIC_OR_DEPARTMENT_OTHER): Payer: Self-pay

## 2023-06-16 ENCOUNTER — Other Ambulatory Visit: Payer: Self-pay

## 2023-06-16 MED ORDER — MOUNJARO 7.5 MG/0.5ML ~~LOC~~ SOAJ
7.5000 mg | SUBCUTANEOUS | 1 refills | Status: DC
  Filled 2023-06-16: qty 2, 28d supply, fill #0
  Filled 2023-09-14 – 2023-09-28 (×2): qty 2, 28d supply, fill #1

## 2023-06-25 ENCOUNTER — Other Ambulatory Visit (HOSPITAL_BASED_OUTPATIENT_CLINIC_OR_DEPARTMENT_OTHER): Payer: Self-pay

## 2023-07-01 ENCOUNTER — Other Ambulatory Visit (HOSPITAL_BASED_OUTPATIENT_CLINIC_OR_DEPARTMENT_OTHER): Payer: Self-pay

## 2023-07-01 ENCOUNTER — Encounter (HOSPITAL_BASED_OUTPATIENT_CLINIC_OR_DEPARTMENT_OTHER): Payer: Self-pay

## 2023-07-05 ENCOUNTER — Other Ambulatory Visit (HOSPITAL_BASED_OUTPATIENT_CLINIC_OR_DEPARTMENT_OTHER): Payer: Self-pay

## 2023-07-06 ENCOUNTER — Other Ambulatory Visit (HOSPITAL_BASED_OUTPATIENT_CLINIC_OR_DEPARTMENT_OTHER): Payer: Self-pay

## 2023-07-06 MED ORDER — SPIRONOLACTONE 25 MG PO TABS
25.0000 mg | ORAL_TABLET | Freq: Every day | ORAL | 2 refills | Status: DC
  Filled 2023-07-06 – 2023-07-20 (×2): qty 30, 30d supply, fill #0
  Filled 2023-08-16: qty 30, 30d supply, fill #1
  Filled 2023-09-14: qty 30, 30d supply, fill #2

## 2023-07-08 ENCOUNTER — Other Ambulatory Visit: Payer: Self-pay | Admitting: Family Medicine

## 2023-07-08 ENCOUNTER — Other Ambulatory Visit (HOSPITAL_BASED_OUTPATIENT_CLINIC_OR_DEPARTMENT_OTHER): Payer: Self-pay

## 2023-07-08 DIAGNOSIS — G4709 Other insomnia: Secondary | ICD-10-CM

## 2023-07-10 ENCOUNTER — Other Ambulatory Visit: Payer: Self-pay | Admitting: Family Medicine

## 2023-07-10 ENCOUNTER — Other Ambulatory Visit (HOSPITAL_BASED_OUTPATIENT_CLINIC_OR_DEPARTMENT_OTHER): Payer: Self-pay

## 2023-07-10 MED ORDER — BLISOVI 24 FE 1-20 MG-MCG(24) PO TABS
1.0000 | ORAL_TABLET | Freq: Every day | ORAL | 3 refills | Status: DC
  Filled 2023-07-10: qty 84, 84d supply, fill #0

## 2023-07-11 ENCOUNTER — Other Ambulatory Visit (HOSPITAL_BASED_OUTPATIENT_CLINIC_OR_DEPARTMENT_OTHER): Payer: Self-pay

## 2023-07-11 MED ORDER — TRAZODONE HCL 50 MG PO TABS
50.0000 mg | ORAL_TABLET | Freq: Every day | ORAL | 3 refills | Status: DC
  Filled 2023-07-20 – 2023-09-14 (×2): qty 90, 90d supply, fill #0
  Filled 2024-01-13: qty 90, 90d supply, fill #1

## 2023-07-20 ENCOUNTER — Other Ambulatory Visit (HOSPITAL_BASED_OUTPATIENT_CLINIC_OR_DEPARTMENT_OTHER): Payer: Self-pay

## 2023-07-20 MED ORDER — NORETHIN ACE-ETH ESTRAD-FE 1-20 MG-MCG PO TABS
1.0000 | ORAL_TABLET | Freq: Every day | ORAL | 3 refills | Status: AC
  Filled 2023-07-20: qty 84, 84d supply, fill #0

## 2023-07-20 MED ORDER — MOUNJARO 5 MG/0.5ML ~~LOC~~ SOAJ
5.0000 mg | SUBCUTANEOUS | 3 refills | Status: DC
  Filled 2023-07-20: qty 6, 84d supply, fill #0
  Filled 2024-03-18: qty 6, 84d supply, fill #1

## 2023-07-21 ENCOUNTER — Other Ambulatory Visit (HOSPITAL_BASED_OUTPATIENT_CLINIC_OR_DEPARTMENT_OTHER): Payer: Self-pay

## 2023-07-21 ENCOUNTER — Other Ambulatory Visit: Payer: Self-pay

## 2023-07-21 MED ORDER — TRAZODONE HCL 100 MG PO TABS
100.0000 mg | ORAL_TABLET | Freq: Every evening | ORAL | 3 refills | Status: AC
  Filled 2023-07-21: qty 90, 90d supply, fill #0
  Filled 2023-10-25: qty 90, 90d supply, fill #1
  Filled 2023-12-13 – 2024-02-29 (×2): qty 90, 90d supply, fill #2
  Filled 2024-04-02 – 2024-06-10 (×3): qty 90, 90d supply, fill #3

## 2023-07-27 ENCOUNTER — Other Ambulatory Visit (HOSPITAL_BASED_OUTPATIENT_CLINIC_OR_DEPARTMENT_OTHER): Payer: Self-pay

## 2023-08-16 ENCOUNTER — Other Ambulatory Visit (HOSPITAL_BASED_OUTPATIENT_CLINIC_OR_DEPARTMENT_OTHER): Payer: Self-pay

## 2023-08-16 ENCOUNTER — Other Ambulatory Visit: Payer: Self-pay | Admitting: Family Medicine

## 2023-08-16 DIAGNOSIS — E039 Hypothyroidism, unspecified: Secondary | ICD-10-CM

## 2023-08-17 ENCOUNTER — Other Ambulatory Visit (HOSPITAL_BASED_OUTPATIENT_CLINIC_OR_DEPARTMENT_OTHER): Payer: Self-pay

## 2023-08-17 ENCOUNTER — Other Ambulatory Visit: Payer: Self-pay

## 2023-08-17 MED ORDER — LEVOTHYROXINE SODIUM 112 MCG PO TABS
112.0000 ug | ORAL_TABLET | Freq: Every day | ORAL | 3 refills | Status: DC
  Filled 2023-08-17 – 2023-10-25 (×2): qty 90, 90d supply, fill #0
  Filled 2023-12-13 – 2024-01-21 (×2): qty 90, 90d supply, fill #1
  Filled 2024-04-02 – 2024-05-22 (×3): qty 90, 90d supply, fill #2
  Filled 2024-06-10: qty 90, 90d supply, fill #3

## 2023-09-15 ENCOUNTER — Other Ambulatory Visit: Payer: Self-pay

## 2023-09-15 ENCOUNTER — Other Ambulatory Visit (HOSPITAL_BASED_OUTPATIENT_CLINIC_OR_DEPARTMENT_OTHER): Payer: Self-pay

## 2023-09-15 ENCOUNTER — Encounter (HOSPITAL_BASED_OUTPATIENT_CLINIC_OR_DEPARTMENT_OTHER): Payer: Self-pay | Admitting: Pharmacist

## 2023-10-16 DIAGNOSIS — Z9884 Bariatric surgery status: Secondary | ICD-10-CM | POA: Diagnosis not present

## 2023-10-16 DIAGNOSIS — N926 Irregular menstruation, unspecified: Secondary | ICD-10-CM | POA: Diagnosis not present

## 2023-10-16 DIAGNOSIS — Z6830 Body mass index (BMI) 30.0-30.9, adult: Secondary | ICD-10-CM | POA: Diagnosis not present

## 2023-10-16 DIAGNOSIS — E1169 Type 2 diabetes mellitus with other specified complication: Secondary | ICD-10-CM | POA: Diagnosis not present

## 2023-10-16 DIAGNOSIS — E66811 Obesity, class 1: Secondary | ICD-10-CM | POA: Diagnosis not present

## 2023-10-16 DIAGNOSIS — R232 Flushing: Secondary | ICD-10-CM | POA: Diagnosis not present

## 2023-10-16 DIAGNOSIS — E039 Hypothyroidism, unspecified: Secondary | ICD-10-CM | POA: Diagnosis not present

## 2023-10-22 ENCOUNTER — Other Ambulatory Visit: Payer: Self-pay

## 2023-10-22 ENCOUNTER — Other Ambulatory Visit (HOSPITAL_BASED_OUTPATIENT_CLINIC_OR_DEPARTMENT_OTHER): Payer: Self-pay

## 2023-10-22 DIAGNOSIS — M9901 Segmental and somatic dysfunction of cervical region: Secondary | ICD-10-CM | POA: Diagnosis not present

## 2023-10-22 DIAGNOSIS — M9902 Segmental and somatic dysfunction of thoracic region: Secondary | ICD-10-CM | POA: Diagnosis not present

## 2023-10-22 DIAGNOSIS — M5032 Other cervical disc degeneration, mid-cervical region, unspecified level: Secondary | ICD-10-CM | POA: Diagnosis not present

## 2023-10-22 DIAGNOSIS — M542 Cervicalgia: Secondary | ICD-10-CM | POA: Diagnosis not present

## 2023-10-22 MED ORDER — MOUNJARO 10 MG/0.5ML ~~LOC~~ SOAJ
10.0000 mg | SUBCUTANEOUS | 0 refills | Status: DC
Start: 2023-10-22 — End: 2024-03-18
  Filled 2023-10-22: qty 2, 28d supply, fill #0
  Filled 2024-02-29: qty 2, 28d supply, fill #1

## 2023-10-22 MED ORDER — MOUNJARO 10 MG/0.5ML ~~LOC~~ SOAJ
10.0000 mg | SUBCUTANEOUS | 0 refills | Status: DC
Start: 2023-10-22 — End: 2024-03-18
  Filled 2023-12-13: qty 6, 84d supply, fill #0

## 2023-10-25 ENCOUNTER — Other Ambulatory Visit (HOSPITAL_BASED_OUTPATIENT_CLINIC_OR_DEPARTMENT_OTHER): Payer: Self-pay

## 2023-10-26 ENCOUNTER — Other Ambulatory Visit (HOSPITAL_BASED_OUTPATIENT_CLINIC_OR_DEPARTMENT_OTHER): Payer: Self-pay

## 2023-10-26 ENCOUNTER — Other Ambulatory Visit: Payer: Self-pay

## 2023-10-27 ENCOUNTER — Other Ambulatory Visit (HOSPITAL_BASED_OUTPATIENT_CLINIC_OR_DEPARTMENT_OTHER): Payer: Self-pay

## 2023-10-27 MED ORDER — SPIRONOLACTONE 25 MG PO TABS
25.0000 mg | ORAL_TABLET | Freq: Every day | ORAL | 2 refills | Status: DC
  Filled 2023-10-27: qty 30, 30d supply, fill #0
  Filled 2024-05-22: qty 30, 30d supply, fill #1
  Filled 2024-06-10: qty 30, 30d supply, fill #2

## 2023-12-14 ENCOUNTER — Other Ambulatory Visit (HOSPITAL_BASED_OUTPATIENT_CLINIC_OR_DEPARTMENT_OTHER): Payer: Self-pay

## 2023-12-15 ENCOUNTER — Other Ambulatory Visit: Payer: Self-pay

## 2023-12-15 ENCOUNTER — Other Ambulatory Visit (HOSPITAL_BASED_OUTPATIENT_CLINIC_OR_DEPARTMENT_OTHER): Payer: Self-pay

## 2023-12-16 ENCOUNTER — Other Ambulatory Visit (HOSPITAL_COMMUNITY)

## 2023-12-21 ENCOUNTER — Other Ambulatory Visit (HOSPITAL_COMMUNITY)

## 2023-12-23 ENCOUNTER — Other Ambulatory Visit (HOSPITAL_COMMUNITY)
Admission: RE | Admit: 2023-12-23 | Discharge: 2023-12-23 | Disposition: A | Discharge status: Home / Self Care | Payer: Self-pay | Source: Ambulatory Visit | Attending: Oncology | Admitting: Oncology

## 2023-12-23 DIAGNOSIS — Z006 Encounter for examination for normal comparison and control in clinical research program: Secondary | ICD-10-CM | POA: Insufficient documentation

## 2023-12-29 LAB — GENECONNECT MOLECULAR SCREEN: Genetic Analysis Overall Interpretation: NEGATIVE

## 2024-01-13 ENCOUNTER — Other Ambulatory Visit (HOSPITAL_BASED_OUTPATIENT_CLINIC_OR_DEPARTMENT_OTHER): Payer: Self-pay

## 2024-01-15 DIAGNOSIS — Z9884 Bariatric surgery status: Secondary | ICD-10-CM | POA: Diagnosis not present

## 2024-01-15 DIAGNOSIS — E039 Hypothyroidism, unspecified: Secondary | ICD-10-CM | POA: Diagnosis not present

## 2024-01-15 DIAGNOSIS — Z6829 Body mass index (BMI) 29.0-29.9, adult: Secondary | ICD-10-CM | POA: Diagnosis not present

## 2024-01-15 DIAGNOSIS — Z8639 Personal history of other endocrine, nutritional and metabolic disease: Secondary | ICD-10-CM | POA: Diagnosis not present

## 2024-01-15 DIAGNOSIS — R232 Flushing: Secondary | ICD-10-CM | POA: Diagnosis not present

## 2024-01-15 DIAGNOSIS — E1169 Type 2 diabetes mellitus with other specified complication: Secondary | ICD-10-CM | POA: Diagnosis not present

## 2024-01-15 DIAGNOSIS — R632 Polyphagia: Secondary | ICD-10-CM | POA: Diagnosis not present

## 2024-01-15 DIAGNOSIS — E663 Overweight: Secondary | ICD-10-CM | POA: Diagnosis not present

## 2024-01-21 ENCOUNTER — Other Ambulatory Visit: Payer: Self-pay

## 2024-01-21 ENCOUNTER — Other Ambulatory Visit (HOSPITAL_BASED_OUTPATIENT_CLINIC_OR_DEPARTMENT_OTHER): Payer: Self-pay

## 2024-02-04 ENCOUNTER — Encounter: Payer: 59 | Admitting: Family Medicine

## 2024-02-22 ENCOUNTER — Institutional Professional Consult (permissible substitution): Admitting: Plastic Surgery

## 2024-02-29 ENCOUNTER — Other Ambulatory Visit (HOSPITAL_BASED_OUTPATIENT_CLINIC_OR_DEPARTMENT_OTHER): Payer: Self-pay

## 2024-03-04 ENCOUNTER — Other Ambulatory Visit (HOSPITAL_BASED_OUTPATIENT_CLINIC_OR_DEPARTMENT_OTHER): Payer: Self-pay

## 2024-03-04 ENCOUNTER — Other Ambulatory Visit: Payer: Self-pay

## 2024-03-04 DIAGNOSIS — L900 Lichen sclerosus et atrophicus: Secondary | ICD-10-CM | POA: Diagnosis not present

## 2024-03-04 DIAGNOSIS — L719 Rosacea, unspecified: Secondary | ICD-10-CM | POA: Diagnosis not present

## 2024-03-04 MED ORDER — TACROLIMUS 0.1 % EX OINT
1.0000 | TOPICAL_OINTMENT | Freq: Every day | CUTANEOUS | 2 refills | Status: AC
  Filled 2024-03-04: qty 60, 30d supply, fill #0

## 2024-03-04 MED ORDER — DOXYCYCLINE HYCLATE 100 MG PO CAPS
100.0000 mg | ORAL_CAPSULE | Freq: Two times a day (BID) | ORAL | 3 refills | Status: AC
  Filled 2024-03-04: qty 60, 30d supply, fill #0
  Filled 2024-03-18 – 2024-05-22 (×2): qty 60, 30d supply, fill #1

## 2024-03-09 ENCOUNTER — Other Ambulatory Visit (HOSPITAL_BASED_OUTPATIENT_CLINIC_OR_DEPARTMENT_OTHER): Payer: Self-pay

## 2024-03-18 ENCOUNTER — Other Ambulatory Visit: Payer: Self-pay | Admitting: Family Medicine

## 2024-03-18 ENCOUNTER — Ambulatory Visit: Admitting: Family Medicine

## 2024-03-18 ENCOUNTER — Other Ambulatory Visit: Payer: Self-pay

## 2024-03-18 ENCOUNTER — Encounter: Payer: Self-pay | Admitting: Family Medicine

## 2024-03-18 ENCOUNTER — Other Ambulatory Visit (HOSPITAL_COMMUNITY): Payer: Self-pay

## 2024-03-18 VITALS — BP 104/65 | HR 82 | Temp 98.1°F | Ht 67.0 in | Wt 188.8 lb

## 2024-03-18 DIAGNOSIS — Z7985 Long-term (current) use of injectable non-insulin antidiabetic drugs: Secondary | ICD-10-CM | POA: Diagnosis not present

## 2024-03-18 DIAGNOSIS — E282 Polycystic ovarian syndrome: Secondary | ICD-10-CM | POA: Diagnosis not present

## 2024-03-18 DIAGNOSIS — F5101 Primary insomnia: Secondary | ICD-10-CM | POA: Diagnosis not present

## 2024-03-18 DIAGNOSIS — E538 Deficiency of other specified B group vitamins: Secondary | ICD-10-CM | POA: Diagnosis not present

## 2024-03-18 DIAGNOSIS — F339 Major depressive disorder, recurrent, unspecified: Secondary | ICD-10-CM | POA: Diagnosis not present

## 2024-03-18 DIAGNOSIS — Z1231 Encounter for screening mammogram for malignant neoplasm of breast: Secondary | ICD-10-CM

## 2024-03-18 DIAGNOSIS — E039 Hypothyroidism, unspecified: Secondary | ICD-10-CM | POA: Diagnosis not present

## 2024-03-18 DIAGNOSIS — E559 Vitamin D deficiency, unspecified: Secondary | ICD-10-CM

## 2024-03-18 DIAGNOSIS — E119 Type 2 diabetes mellitus without complications: Secondary | ICD-10-CM

## 2024-03-18 DIAGNOSIS — Z0001 Encounter for general adult medical examination with abnormal findings: Secondary | ICD-10-CM | POA: Diagnosis not present

## 2024-03-18 DIAGNOSIS — L719 Rosacea, unspecified: Secondary | ICD-10-CM | POA: Diagnosis not present

## 2024-03-18 LAB — COMPREHENSIVE METABOLIC PANEL WITH GFR
ALT: 22 U/L (ref 0–35)
AST: 16 U/L (ref 0–37)
Albumin: 4.5 g/dL (ref 3.5–5.2)
Alkaline Phosphatase: 60 U/L (ref 39–117)
BUN: 22 mg/dL (ref 6–23)
CO2: 23 meq/L (ref 19–32)
Calcium: 9.8 mg/dL (ref 8.4–10.5)
Chloride: 105 meq/L (ref 96–112)
Creatinine, Ser: 0.7 mg/dL (ref 0.40–1.20)
GFR: 105.19 mL/min (ref >60.00)
Glucose, Bld: 72 mg/dL (ref 70–99)
Potassium: 4.1 meq/L (ref 3.5–5.1)
Sodium: 140 meq/L (ref 135–145)
Total Bilirubin: 0.6 mg/dL (ref 0.2–1.2)
Total Protein: 7 g/dL (ref 6.0–8.3)

## 2024-03-18 LAB — CBC WITH DIFFERENTIAL/PLATELET
Basophils Absolute: 0.1 K/uL (ref 0.0–0.1)
Basophils Relative: 1.2 % (ref 0.0–3.0)
Eosinophils Absolute: 0.1 K/uL (ref 0.0–0.7)
Eosinophils Relative: 1.1 % (ref 0.0–5.0)
HCT: 44.4 % (ref 36.0–46.0)
Hemoglobin: 14.9 g/dL (ref 12.0–15.0)
Lymphocytes Relative: 25.9 % (ref 12.0–46.0)
Lymphs Abs: 1.7 K/uL (ref 0.7–4.0)
MCHC: 33.5 g/dL (ref 30.0–36.0)
MCV: 89.2 fl (ref 78.0–100.0)
Monocytes Absolute: 0.4 K/uL (ref 0.1–1.0)
Monocytes Relative: 6.5 % (ref 3.0–12.0)
Neutro Abs: 4.3 K/uL (ref 1.4–7.7)
Neutrophils Relative %: 65.3 % (ref 43.0–77.0)
Platelets: 349 K/uL (ref 150.0–400.0)
RBC: 4.97 Mil/uL (ref 3.87–5.11)
RDW: 13.1 % (ref 11.5–15.5)
WBC: 6.6 K/uL (ref 4.0–10.5)

## 2024-03-18 LAB — LIPID PANEL
Cholesterol: 174 mg/dL (ref 0–200)
HDL: 48 mg/dL (ref >39.00)
LDL Cholesterol: 104 mg/dL — ABNORMAL HIGH (ref 0–99)
NonHDL: 126.09
Total CHOL/HDL Ratio: 4
Triglycerides: 110 mg/dL (ref 0.0–149.0)
VLDL: 22 mg/dL (ref 0.0–40.0)

## 2024-03-18 LAB — MICROALBUMIN / CREATININE URINE RATIO
Creatinine,U: 138.7 mg/dL
Microalb Creat Ratio: 9.2 mg/g (ref 0.0–30.0)
Microalb, Ur: 1.3 mg/dL (ref 0.0–1.9)

## 2024-03-18 LAB — HEMOGLOBIN A1C: Hgb A1c MFr Bld: 5.2 % (ref 4.6–6.5)

## 2024-03-18 LAB — VITAMIN D 25 HYDROXY (VIT D DEFICIENCY, FRACTURES): VITD: 34.61 ng/mL (ref 30.00–100.00)

## 2024-03-18 LAB — VITAMIN B12: Vitamin B-12: 286 pg/mL (ref 211–911)

## 2024-03-18 LAB — TSH: TSH: 1.43 u[IU]/mL (ref 0.35–5.50)

## 2024-03-18 MED ORDER — VENLAFAXINE HCL ER 37.5 MG PO CP24
37.5000 mg | ORAL_CAPSULE | Freq: Every day | ORAL | 3 refills | Status: DC
  Filled 2024-03-18 – 2024-04-02 (×3): qty 90, 90d supply, fill #0
  Filled 2024-06-10: qty 90, 90d supply, fill #1

## 2024-03-18 NOTE — Progress Notes (Signed)
 Subjective  Chief Complaint  Patient presents with   Annual Exam    Pt here for Annual Exam and is not currently fasting. Pt declined for the Pap   Hypothyroidism   Obesity    HPI: Tammy Mooney is a 43 y.o. female who presents to [facility] today for a Female Wellness Visit. She also has the concerns and/or needs as listed above in the chief complaint. These will be addressed in addition to the Health Maintenance Visit.   Wellness Visit: annual visit with health maintenance review and exam  HM: due pap but defers today. Feels well w/o concerns. Chronic disease f/u and/or acute problem visit: (deemed necessary to be done in addition to the wellness visit): Type 2 diabetes well managed with mounjaro . Weight loss about 80 pounds. Feels good. Diet is good. Feels weight loss has plateaued. No foot concerns. Eye exam up to date.  Hypothyroidism due for recheck with TSH. Clinically feels well.  Mood is stable on effexor  xr.  Due for vitamin rechecks.  No longer needing trazodone  for insomnia.  On ocps and spironolactone  for pcos/cycle control and skin care.   Assessment  1. Encounter for well adult exam with abnormal findings   2. PCOS (polycystic ovarian syndrome)   3. Primary hypothyroidism   4. Vitamin B12 deficiency   5. Vitamin D  deficiency   6. Major depression, recurrent, chronic ([name])   7. Primary insomnia   8. Controlled type 2 diabetes mellitus without complication, without long-term current use of insulin  ([name])   9. Rosacea      Plan  Female Wellness Visit: Age appropriate Health Maintenance and Prevention measures were discussed with patient. Included topics are cancer screening recommendations, ways to keep healthy (see AVS) including dietary and exercise recommendations, regular eye and dental care, use of seat belts, and avoidance of moderate alcohol use and tobacco use. Pt to schedule with gyn for pap smear. Mammo is scheduled.  BMI:  discussed patient's BMI and encouraged positive lifestyle modifications to help get to or maintain a target BMI. HM needs and immunizations were addressed and ordered. See below for orders. See HM and immunization section for updates. Routine labs and screening tests ordered including cmp, cbc and lipids where appropriate. Discussed recommendations regarding Vit D and calcium supplementation (see AVS)  Chronic disease management visit and/or acute problem visit: Diabetes: recheck A1c and urine neprhopathy screen. On mounjaro . No complications. Consider statin. Recheck lipids Check tsh on levothyroxine  112 mcg daily.  Continue effexor  for depression; well controlled.  Normotensive.  Derm is managing skin with doxy and spironolactone . Monitor potassium.   Follow up: 6 mo for recheck diabetes.   Orders Placed This Encounter  Procedures   TSH   VITAMIN D  25 Hydroxy (Vit-D Deficiency, Fractures)   CBC with Differential/Platelet   Comprehensive metabolic panel with GFR   Lipid panel   Vitamin B12   Hemoglobin A1c   Microalbumin / creatinine urine ratio   Meds ordered this encounter  Medications   venlafaxine  XR (EFFEXOR -XR) 37.5 MG 24 hr capsule    Sig: Take 1 capsule (37.5 mg total) by mouth daily with food    Dispense:  90 capsule    Refill:  3   tirzepatide  (MOUNJARO ) 10 MG/0.5ML Pen    Sig: Inject 10 mg into the skin once a week.    Dispense:  6 mL    Refill:  1      Body mass index is 29.57  kg/m. Wt Readings from Last 3 Encounters:  03/18/24 188 lb 12.8 oz (85.6 kg)  02/03/23 199 lb 3.2 oz (90.4 kg)  01/29/22 237 lb 9.6 oz (107.8 kg)     Patient Active Problem List   Diagnosis Date Noted   Intraductal papilloma of breast, left 02/03/2023    Priority: Medium     Left breast biopsy 06/2022.  Usual hyperplasia.  No malignancy.  Breast center    Goiter 05/08/2021    Priority: Medium     Ultrasound 202: heterogenous, single subcentimeter nodule. No further  surveillance.    Prediabetes 05/08/2021    Priority: High   Major depression, recurrent, chronic ([name]) 11/21/2020    Priority: High   S/P laparoscopic sleeve gastrectomy 04/05/2019    Priority: High   Morbid obesity ([name]) 02/09/2017    Priority: High   Primary hypothyroidism 11/13/2015    Priority: High   Chronic pain of right knee 04/22/2018    Priority: Medium     Previous evaluation by Orthopedics. MRI available.    Primary insomnia 09/17/2017    Priority: Medium    Anxiety 11/13/2015    Priority: Medium     Mild. Patient takes Buspar  as needed.    PCOS (polycystic ovarian syndrome) 09/25/2011    Priority: Medium    Vitamin B12 deficiency 05/08/2021    Priority: Low   Rosacea 11/21/2020    Priority: Low   Vitamin D  deficiency 02/23/2017    Priority: Low    repleted 07/2019    Allergic rhinitis 01/03/2016    Priority: Low    Controlled with Zyrtec.    Controlled type 2 diabetes mellitus without complication, without long-term current use of insulin  ([name]) 03/21/2024   Health Maintenance  Topic Date Due   Hepatitis B Vaccines 19-59 Average Risk (1 of 3 - 19+ 3-dose series) Never done   HPV VACCINES (1 - Risk 3-dose SCDM series) Never done   OPHTHALMOLOGY EXAM  01/30/2019   FOOT EXAM  07/20/2019   Cervical Cancer Screening (HPV/Pap Cotest)  07/10/2022   MAMMOGRAM  01/02/2024   INFLUENZA VACCINE  02/19/2024   COVID-19 Vaccine (1) 04/03/2024 (Originally 10/18/1985)   HEMOGLOBIN A1C  09/17/2024   Diabetic kidney evaluation - eGFR measurement  03/18/2025   Diabetic kidney evaluation - Urine ACR  03/18/2025   DTaP/Tdap/Td (2 - Td or Tdap) 02/02/2033   Hepatitis C Screening  Completed   HIV Screening  Completed   Meningococcal B Vaccine  Aged Out   Immunization History  Administered Date(s) Administered   Influenza,inj,Quad PF,6+ Mos 04/12/2018   Influenza-Unspecified 05/14/2017, 04/21/2019, 05/07/2021   Tdap 02/03/2023   We updated and reviewed the patient's past  history in detail and it is documented below. Allergies: Patient is allergic to adhesive [tape] and penicillins. Past Medical History Patient  has a past medical history of Anxiety, Coronavirus infection, Depression, DM (diabetes mellitus), type 2 ([name]), Fatty liver, Hypothyroidism, Infertility, female, Low HDL (under 40), PCOS (polycystic ovarian syndrome), Varicella, and Vertigo. Past Surgical History Patient  has a past surgical history that includes Cesarean section; Tonsillectomy; Eye surgery; Nasal sinus surgery; Cesarean section (07/01/2012); Cesarean section (N/A, 10/25/2014); Mandible surgery (AGE 47); Image guided sinus surgery (N/A, 06/05/2016); Turbinate reduction (Bilateral, 06/05/2016); Sinusotomy (Left, 06/05/2016); Maxillary antrostomy (Right, 06/05/2016); Hardware Removal (Left, 06/05/2016); Laparoscopic gastric sleeve resection (N/A, 04/05/2019); and Breast biopsy (Left, 06/30/2022). Family History: Patient family history includes Depression in her mother; Diabetes in her brother, mother, and paternal grandmother; Heart attack in her father;  Heart disease in her father and maternal grandfather; Hyperlipidemia in her father; Hypertension in her father; Obesity in her father and mother; Sleep apnea in her mother. Social History:  Patient  reports that she has never smoked. She has never used smokeless tobacco. She reports that she does not drink alcohol and does not use drugs.  Review of Systems: Constitutional: negative for fever or malaise Ophthalmic: negative for photophobia, double vision or loss of vision Cardiovascular: negative for chest pain, dyspnea on exertion, or new LE swelling Respiratory: negative for SOB or persistent cough Gastrointestinal: negative for abdominal pain, change in bowel habits or melena Genitourinary: negative for dysuria or gross hematuria, no abnormal uterine bleeding or disharge Musculoskeletal: negative for new gait disturbance or muscular  weakness Integumentary: negative for new or persistent rashes, no breast lumps Neurological: negative for TIA or stroke symptoms Psychiatric: negative for SI or delusions Allergic/Immunologic: negative for hives  Patient Care Team    Relationship Specialty Notifications Start End  Jodie Lavern CROME, MD PCP - General Family Medicine  05/08/21   Verdon Louann BIRCH, MD Consulting Physician Bariatrics  07/11/17   Octavia Bruckner, MD Consulting Physician Ophthalmology  04/23/18   Tanda Locus, MD Consulting Physician General Surgery  07/16/18    Comment: bariatric     Objective  Vitals: BP 104/65   Pulse 82   Temp 98.1 F (36.7 C)   Ht 5' 7 (1.702 m)   Wt 188 lb 12.8 oz (85.6 kg)   SpO2 98%   BMI 29.57 kg/m  General:  Well developed, well nourished, no acute distress  Psych:  Alert and orientedx3,normal mood and affect HEENT:  Normocephalic, atraumatic, non-icteric sclera,  supple neck without adenopathy, mass or thyromegaly Cardiovascular:  Normal S1, S2, RRR without gallop, rub or murmur Respiratory:  Good breath sounds bilaterally, CTAB with normal respiratory effort Gastrointestinal: normal bowel sounds, soft, non-tender, no noted masses. No HSM MSK: extremities without edema, joints without erythema or swelling Neurologic:    Mental status is normal.  Gross motor and sensory exams are normal.  No tremor Diabetic Foot Exam: Appearance - no lesions, ulcers or significant calluses Skin - no sigificant pallor or erythema Normal sensation Pulses - +2 distally bilaterally   Commons side effects, risks, benefits, and alternatives for medications and treatment plan prescribed today were discussed, and the patient expressed understanding of the given instructions. Patient is instructed to call or message via MyChart if he/she has any questions or concerns regarding our treatment plan. No barriers to understanding were identified. We discussed Red Flag symptoms and signs in detail.  Patient expressed understanding regarding what to do in case of urgent or emergency type symptoms.  Medication list was reconciled, printed and provided to the patient in AVS. Patient instructions and summary information was reviewed with the patient as documented in the AVS. This note was prepared with assistance of Dragon voice recognition software. Occasional wrong-word or sound-a-like substitutions may have occurred due to the inherent limitations of voice recognition software

## 2024-03-21 ENCOUNTER — Ambulatory Visit: Payer: Self-pay | Admitting: Family Medicine

## 2024-03-21 ENCOUNTER — Other Ambulatory Visit (HOSPITAL_COMMUNITY): Payer: Self-pay

## 2024-03-21 DIAGNOSIS — E119 Type 2 diabetes mellitus without complications: Secondary | ICD-10-CM | POA: Insufficient documentation

## 2024-03-21 MED ORDER — MOUNJARO 10 MG/0.5ML ~~LOC~~ SOAJ
10.0000 mg | SUBCUTANEOUS | 1 refills | Status: DC
  Filled 2024-03-21 – 2024-04-02 (×3): qty 6, 84d supply, fill #0
  Filled 2024-05-22 – 2024-06-10 (×2): qty 6, 84d supply, fill #1

## 2024-03-21 NOTE — Progress Notes (Signed)
 See mychart note Dear Tammy Mooney seeing you! Overall things look good. Your cholesterol levels, given the diagnosis of diabetes, warrant discussion of starting a statin to lower the risk of cardiovascular disease. Let me know if we can discuss this some.  Also, your vitamin B12 level is low again. Please start b12 daily supplements; if you are taking this already, then I would recommend starting monthly injections at the office. Let me know your if you have been taking it or not.  Everything else looks great! Sincerely, Dr. Jodie

## 2024-03-22 ENCOUNTER — Ambulatory Visit

## 2024-03-24 ENCOUNTER — Other Ambulatory Visit (HOSPITAL_BASED_OUTPATIENT_CLINIC_OR_DEPARTMENT_OTHER): Payer: Self-pay

## 2024-03-24 DIAGNOSIS — Z01419 Encounter for gynecological examination (general) (routine) without abnormal findings: Secondary | ICD-10-CM | POA: Diagnosis not present

## 2024-03-24 DIAGNOSIS — Z1331 Encounter for screening for depression: Secondary | ICD-10-CM | POA: Diagnosis not present

## 2024-03-24 DIAGNOSIS — Z113 Encounter for screening for infections with a predominantly sexual mode of transmission: Secondary | ICD-10-CM | POA: Diagnosis not present

## 2024-03-24 DIAGNOSIS — Z124 Encounter for screening for malignant neoplasm of cervix: Secondary | ICD-10-CM | POA: Diagnosis not present

## 2024-03-24 DIAGNOSIS — Z01411 Encounter for gynecological examination (general) (routine) with abnormal findings: Secondary | ICD-10-CM | POA: Diagnosis not present

## 2024-03-24 DIAGNOSIS — N881 Old laceration of cervix uteri: Secondary | ICD-10-CM | POA: Diagnosis not present

## 2024-03-24 MED ORDER — MISOPROSTOL 200 MCG PO TABS
400.0000 ug | ORAL_TABLET | ORAL | 0 refills | Status: AC
  Filled 2024-03-24: qty 2, 1d supply, fill #0

## 2024-03-29 ENCOUNTER — Other Ambulatory Visit (HOSPITAL_COMMUNITY): Payer: Self-pay

## 2024-03-29 DIAGNOSIS — E119 Type 2 diabetes mellitus without complications: Secondary | ICD-10-CM | POA: Diagnosis not present

## 2024-04-01 ENCOUNTER — Other Ambulatory Visit (HOSPITAL_BASED_OUTPATIENT_CLINIC_OR_DEPARTMENT_OTHER): Payer: Self-pay

## 2024-04-02 ENCOUNTER — Other Ambulatory Visit (HOSPITAL_BASED_OUTPATIENT_CLINIC_OR_DEPARTMENT_OTHER): Payer: Self-pay

## 2024-04-02 ENCOUNTER — Other Ambulatory Visit (HOSPITAL_COMMUNITY): Payer: Self-pay

## 2024-04-05 ENCOUNTER — Ambulatory Visit
Admission: RE | Admit: 2024-04-05 | Discharge: 2024-04-05 | Disposition: A | Discharge status: Home / Self Care | Source: Ambulatory Visit | Attending: Family Medicine | Admitting: Family Medicine

## 2024-04-05 ENCOUNTER — Other Ambulatory Visit: Payer: Self-pay

## 2024-04-05 DIAGNOSIS — Z1231 Encounter for screening mammogram for malignant neoplasm of breast: Secondary | ICD-10-CM | POA: Diagnosis not present

## 2024-04-12 ENCOUNTER — Institutional Professional Consult (permissible substitution): Admitting: Plastic Surgery

## 2024-04-22 ENCOUNTER — Other Ambulatory Visit (HOSPITAL_BASED_OUTPATIENT_CLINIC_OR_DEPARTMENT_OTHER): Payer: Self-pay

## 2024-04-22 DIAGNOSIS — L900 Lichen sclerosus et atrophicus: Secondary | ICD-10-CM | POA: Diagnosis not present

## 2024-04-22 DIAGNOSIS — L719 Rosacea, unspecified: Secondary | ICD-10-CM | POA: Diagnosis not present

## 2024-04-22 MED ORDER — DOXYCYCLINE HYCLATE 50 MG PO CAPS
50.0000 mg | ORAL_CAPSULE | Freq: Two times a day (BID) | ORAL | 3 refills | Status: AC | PRN
  Filled 2024-04-22: qty 30, 15d supply, fill #0

## 2024-05-23 ENCOUNTER — Other Ambulatory Visit (HOSPITAL_COMMUNITY): Payer: Self-pay

## 2024-05-23 ENCOUNTER — Other Ambulatory Visit: Payer: Self-pay

## 2024-06-10 ENCOUNTER — Other Ambulatory Visit (HOSPITAL_COMMUNITY): Payer: Self-pay

## 2024-06-10 ENCOUNTER — Other Ambulatory Visit: Payer: Self-pay

## 2024-06-13 ENCOUNTER — Other Ambulatory Visit: Payer: Self-pay

## 2024-06-15 ENCOUNTER — Other Ambulatory Visit (HOSPITAL_COMMUNITY): Payer: Self-pay

## 2024-06-15 ENCOUNTER — Other Ambulatory Visit: Payer: Self-pay

## 2024-07-11 ENCOUNTER — Ambulatory Visit: Admitting: Family Medicine

## 2024-07-11 ENCOUNTER — Encounter: Payer: Self-pay | Admitting: Family Medicine

## 2024-07-11 VITALS — BP 96/56 | HR 75 | Ht 67.0 in | Wt 183.8 lb

## 2024-07-11 DIAGNOSIS — Z6828 Body mass index (BMI) 28.0-28.9, adult: Secondary | ICD-10-CM | POA: Diagnosis not present

## 2024-07-11 DIAGNOSIS — Z7985 Long-term (current) use of injectable non-insulin antidiabetic drugs: Secondary | ICD-10-CM | POA: Diagnosis not present

## 2024-07-11 DIAGNOSIS — L719 Rosacea, unspecified: Secondary | ICD-10-CM

## 2024-07-11 DIAGNOSIS — E039 Hypothyroidism, unspecified: Secondary | ICD-10-CM

## 2024-07-11 DIAGNOSIS — L900 Lichen sclerosus et atrophicus: Secondary | ICD-10-CM | POA: Insufficient documentation

## 2024-07-11 DIAGNOSIS — E119 Type 2 diabetes mellitus without complications: Secondary | ICD-10-CM

## 2024-07-11 DIAGNOSIS — Z9884 Bariatric surgery status: Secondary | ICD-10-CM | POA: Diagnosis not present

## 2024-07-11 DIAGNOSIS — F5101 Primary insomnia: Secondary | ICD-10-CM | POA: Diagnosis not present

## 2024-07-11 DIAGNOSIS — E663 Overweight: Secondary | ICD-10-CM | POA: Diagnosis not present

## 2024-07-11 DIAGNOSIS — F419 Anxiety disorder, unspecified: Secondary | ICD-10-CM | POA: Diagnosis not present

## 2024-07-11 DIAGNOSIS — N951 Menopausal and female climacteric states: Secondary | ICD-10-CM | POA: Diagnosis not present

## 2024-07-11 DIAGNOSIS — Z8639 Personal history of other endocrine, nutritional and metabolic disease: Secondary | ICD-10-CM

## 2024-07-11 NOTE — Progress Notes (Incomplete)
 "  Weight Management:   Starting weight: 245 lbs. Starting date: 07/18/2021 Today's weight: 183 Today's date: 07/17/2024 Total lbs lost to date: - 62 lbs Total lbs lost since last in-office visit: -5 lbs Total weight loss percentage to date: -25.31% Body mass index is 28.79 kg/m.   Nutrition Plan: 1600cal, 100 g protein, > 60 ounces Anti-obesity medications: Mounjaro . Reported side effects: Dose may be too high. Hunger is well controlled. Cravings are well controlled. Activity: today will be +495 classes Pilates Sleep: Number of hours slept each night: 8. Sleep is restful.   Subjective:   History of Present Illness Tammy Mooney is a 43 year old female with diabetes and obesity who presents for medication management and follow-up.  Hyperlipidemia and cardiovascular risk - Slightly elevated LDL cholesterol on recent laboratory testing - Total cholesterol and HDL cholesterol within normal limits - ASCVD risk calculated at 1% - Not currently taking a statin  Obesity and weight management - Current weight 185-186 lb, representing a slight decrease from previous measurements - Taking 10 mg weight management medication, but experiencing side effects perceived as dose-related - Reduced appetite attributed to current medication - Engages in exercise 3-4 times per week and regularly participates in Pilates  Polycystic ovary syndrome (pcos) and androgenic symptoms - History of PCOS - Previously taking spironolactone  25 mg for hair loss, discontinued 1-2 weeks ago after running out - Experiencing dizziness when standing quickly - Recent blood pressure measured at 96/56 mmHg  Nonalcoholic fatty liver disease - History of fatty liver with previously elevated AST and ALT, now normalized  Thyroid  enlargement - History of goiter, improved with weight loss - Thyroid  ultrasound performed several years ago was normal  Dermatologic conditions - Lichen sclerosus managed with  topical ointment - Rosacea treated with doxycycline  during flares  Nutritional intake and hydration - Attempting to increase protein intake to 100 g per day, but struggling due to reduced appetite from medication - Consuming approximately 30-40 oz of fluids daily, with a goal of 60 oz  Sleep disturbance - Takes trazodone  for sleep, which effectively maintains sleep  Menstrual irregularity and contraception - No true menstrual cycle since August - Considering IUD reinsertion after a failed attempt  Review of Systems: Negative, with the exception of above mentioned in HPI.  History:   Reviewed by clinician on day of visit: allergies, medications, problem list, medical history, surgical history, family history, social history, and previous encounter notes.  Medications:   Show/hide medication list[1] Allergies[2]  Objective:   BP (!) 96/56 (BP Location: Right Arm, Cuff Size: Normal)   Pulse 75   Ht 5' 7 (1.702 m)   Wt 183 lb 12.8 oz (83.4 kg)   LMP  (LMP Unknown)   SpO2 96%   BMI 28.79 kg/m   Physical Exam Constitutional:      General: She is not in acute distress.    Appearance: She is well-developed.  HENT:     Head: Normocephalic and atraumatic.  Eyes:     Conjunctiva/sclera: Conjunctivae normal.  Cardiovascular:     Rate and Rhythm: Normal rate and regular rhythm.     Heart sounds: Normal heart sounds.  Pulmonary:     Effort: Pulmonary effort is normal.     Breath sounds: Normal breath sounds.  Neurological:     General: No focal deficit present.     Mental Status: She is alert.  Psychiatric:        Behavior: Behavior normal.    Results for  orders placed or performed in visit on 03/18/24  TSH   Collection Time: 03/18/24 10:53 AM  Result Value Ref Range   TSH 1.43 0.35 - 5.50 uIU/mL  VITAMIN D  25 Hydroxy (Vit-D Deficiency, Fractures)   Collection Time: 03/18/24 10:53 AM  Result Value Ref Range   VITD 34.61 30.00 - 100.00 ng/mL  CBC with  Differential/Platelet   Collection Time: 03/18/24 10:53 AM  Result Value Ref Range   WBC 6.6 4.0 - 10.5 K/uL   RBC 4.97 3.87 - 5.11 Mil/uL   Hemoglobin 14.9 12.0 - 15.0 g/dL   HCT 55.5 63.9 - 53.9 %   MCV 89.2 78.0 - 100.0 fl   MCHC 33.5 30.0 - 36.0 g/dL   RDW 86.8 88.4 - 84.4 %   Platelets 349.0 150.0 - 400.0 K/uL   Neutrophils Relative % 65.3 43.0 - 77.0 %   Lymphocytes Relative 25.9 12.0 - 46.0 %   Monocytes Relative 6.5 3.0 - 12.0 %   Eosinophils Relative 1.1 0.0 - 5.0 %   Basophils Relative 1.2 0.0 - 3.0 %   Neutro Abs 4.3 1.4 - 7.7 K/uL   Lymphs Abs 1.7 0.7 - 4.0 K/uL   Monocytes Absolute 0.4 0.1 - 1.0 K/uL   Eosinophils Absolute 0.1 0.0 - 0.7 K/uL   Basophils Absolute 0.1 0.0 - 0.1 K/uL  Comprehensive metabolic panel with GFR   Collection Time: 03/18/24 10:53 AM  Result Value Ref Range   Sodium 140 135 - 145 mEq/L   Potassium 4.1 3.5 - 5.1 mEq/L   Chloride 105 96 - 112 mEq/L   CO2 23 19 - 32 mEq/L   Glucose, Bld 72 70 - 99 mg/dL   BUN 22 6 - 23 mg/dL   Creatinine, Ser 9.29 0.40 - 1.20 mg/dL   Total Bilirubin 0.6 0.2 - 1.2 mg/dL   Alkaline Phosphatase 60 39 - 117 U/L   AST 16 0 - 37 U/L   ALT 22 0 - 35 U/L   Total Protein 7.0 6.0 - 8.3 g/dL   Albumin 4.5 3.5 - 5.2 g/dL   GFR 894.80 >39.99 mL/min   Calcium 9.8 8.4 - 10.5 mg/dL  Lipid panel   Collection Time: 03/18/24 10:53 AM  Result Value Ref Range   Cholesterol 174 0 - 200 mg/dL   Triglycerides 889.9 0.0 - 149.0 mg/dL   HDL 51.99 >60.99 mg/dL   VLDL 77.9 0.0 - 59.9 mg/dL   LDL Cholesterol 895 (H) 0 - 99 mg/dL   Total CHOL/HDL Ratio 4    NonHDL 126.09   Vitamin B12   Collection Time: 03/18/24 10:53 AM  Result Value Ref Range   Vitamin B-12 286 211 - 911 pg/mL  Hemoglobin A1c   Collection Time: 03/18/24 10:53 AM  Result Value Ref Range   Hgb A1c MFr Bld 5.2 4.6 - 6.5 %  Microalbumin / creatinine urine ratio   Collection Time: 03/18/24 10:54 AM  Result Value Ref Range   Microalb, Ur 1.3 0.0 - 1.9 mg/dL    Creatinine,U 861.2 mg/dL   Microalb Creat Ratio 9.2 0.0 - 30.0 mg/g    Diagnoses and Orders:   1. History of obesity   2. 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  No orders of the defined types were placed in this encounter.   Assessment & Plan:    Decrease to mounjaro  7.5 - microdose 10 mg at nurse visit. Valium  -  5 tab.  Spinolactone Mommy makeover? Intertrigo. Assessment and Plan Assessment & Plan Obesity, status post bariatric surgery Obesity management post-bariatric surgery with current weight maintenance. Reports side effects from current medication dose of 10 mg, suggesting a need for dose adjustment. Current weight is slightly down from previous measurements. Engages in regular exercise and aims to increase protein intake to 100 grams per day. Fluid intake is below recommended levels. Current medication provides cardiovascular protection, and guidelines may change due to GLP-1 receptor agonists' protective effects. - Will reduce medication dose to 7.5 mg due to side effects. - Encouraged increased protein intake to 100 grams per day. - Encouraged increased fluid intake to 60 ounces per day. - Continue regular exercise regimen.  Polycystic ovary syndrome PCOS management with spironolactone  for hair loss. Currently not taking spironolactone  due to running out of medication. Blood pressure is low at 96/56 mmHg, but she feels well with occasional dizziness upon standing quickly. - Refilled spironolactone  prescription.  Rosacea Managed with doxycycline  during flare-ups. Dermatologist discussed Accutane but she opted against it due to the intensive treatment plan. Uses topical treatments for skin smoothness. - Continue doxycycline  during flare-ups. - Continue topical treatments as needed.  Lichen sclerosus Diagnosed by dermatologist. Not itchy and managed with topical treatments for  smoothness. - Continue topical treatments as needed.  Perimenopausal symptoms with menstrual irregularity Perimenopausal symptoms with menstrual irregularity. No recent menstrual cycle since August, but not pregnant. Plans to retry IUD insertion with cervical dilation suppository. No pain management provided for insertion, but she is open to medication if needed. - Retry IUD insertion with cervical dilation suppository. - Will consider pain management medication for insertion if needed.  Insomnia Managed with trazodone , which is effective in maintaining sleep throughout the night. - Continue trazodone  for insomnia management.  I personally spent a total of 45 minutes in the care of the patient today including preparing to see the patient, performing a medically appropriate exam/evaluation, counseling and educating, placing orders, and documenting clinical information in the EHR.  Geni Shutter, DO, MS, FAAFP, Dipl. [facility] [address] [place], [address] Dept: [phone] Dept Fax: [phone]  Attestations:   Patient is well-known to me from previous care setting and is establishing care in this system with me as PCP. Chart updated today with reconciliation of problem list, medications, allergies, and relevant history. Preventive care and chronic disease status reviewed.  Portions of historical chart may remain incomplete; will update on an ongoing basis as clinically indicated.  Discussed the use of AI scribe software for clinical note transcription with the patient, who gave verbal consent to proceed.     [1]  Outpatient Medications Prior to Visit  Medication Sig   doxycycline  (VIBRAMYCIN ) 100 MG capsule Take 1 capsule (100 mg total) by mouth 2 (two) times daily.   doxycycline  (VIBRAMYCIN ) 50 MG capsule Take 1 capsule (50 mg total) by mouth 2 (two) times daily as needed for flares. Take with food.   fluticasone  (FLONASE ) 50  MCG/ACT nasal spray Place 1 spray into both nostrils daily.   levothyroxine  (SYNTHROID ) 112 MCG tablet Take 1 tablet (112 mcg total) by mouth daily.   misoprostol  (CYTOTEC ) 200 MCG tablet Place 2 tablets (400 mcg total) vaginally 4 hours before procedure.   tirzepatide  (MOUNJARO ) 10 MG/0.5ML Pen Inject 10 mg into the skin once a week.   venlafaxine  XR (EFFEXOR -XR) 37.5 MG 24 hr capsule Take 1 capsule (37.5 mg total) by mouth daily with food  vitamin B-12 (CYANOCOBALAMIN ) 1000 MCG tablet Take 1 tablet (1,000 mcg total) by mouth daily.   calcium gluconate 500 MG tablet Take 1 tablet by mouth 3 (three) times daily.   cholecalciferol (VITAMIN D3) 25 MCG (1000 UNIT) tablet Take 1,000 Units by mouth daily.   Multiple Vitamin (MULTIVITAMIN PO) Take 1 tablet by mouth daily.   norethindrone -ethinyl estradiol -FE (BLISOVI  FE 1/20) 1-20 MG-MCG tablet Take 1 tablet by mouth daily.   spironolactone  (ALDACTONE ) 25 MG tablet Take 1 tablet (25 mg total) by mouth daily.   tacrolimus  (PROTOPIC ) 0.1 % ointment Apply 1 Application topically daily.   traZODone  (DESYREL ) 100 MG tablet Take 1 tablet (100 mg total) by mouth at bedtime.   No facility-administered medications prior to visit.  [2]  Allergies Allergen Reactions   Adhesive [Tape] Rash and Other (See Comments)    Plastic tape causes rash, paper tape is okay, tegaderm is ok   Penicillins Rash    Did it involve swelling of the face/tongue/throat, SOB, or low BP? N Did it involve sudden or severe rash/hives, skin peeling, or any reaction on the inside of your mouth or nose? Y Did you need to seek medical attention at a hospital or doctor's office? N When did it last happen?  childhood If all above answers are NO, may proceed with cephalosporin use.    "

## 2024-07-18 ENCOUNTER — Other Ambulatory Visit (HOSPITAL_COMMUNITY): Payer: Self-pay

## 2024-07-18 MED ORDER — MOUNJARO 10 MG/0.5ML ~~LOC~~ SOAJ
10.0000 mg | SUBCUTANEOUS | 1 refills | Status: DC
  Filled 2024-07-18: qty 6, 84d supply, fill #0

## 2024-07-18 MED ORDER — SPIRONOLACTONE 25 MG PO TABS
25.0000 mg | ORAL_TABLET | Freq: Every day | ORAL | 2 refills | Status: AC
  Filled 2024-07-18: qty 30, 30d supply, fill #0
  Filled 2024-08-17: qty 90, 90d supply, fill #0

## 2024-07-18 MED ORDER — LEVOTHYROXINE SODIUM 112 MCG PO TABS
112.0000 ug | ORAL_TABLET | Freq: Every day | ORAL | 3 refills | Status: AC
  Filled 2024-07-18 – 2024-08-17 (×2): qty 90, 90d supply, fill #0

## 2024-07-18 MED ORDER — DIAZEPAM 5 MG PO TABS
5.0000 mg | ORAL_TABLET | Freq: Two times a day (BID) | ORAL | 0 refills | Status: AC | PRN
  Filled 2024-07-18: qty 5, 3d supply, fill #0

## 2024-07-18 MED ORDER — VENLAFAXINE HCL ER 37.5 MG PO CP24
37.5000 mg | ORAL_CAPSULE | Freq: Every day | ORAL | 3 refills | Status: AC
  Filled 2024-07-18 – 2024-08-17 (×2): qty 90, 90d supply, fill #0

## 2024-07-18 NOTE — Progress Notes (Signed)
 "  Weight Management:   Starting weight: 245 lbs. Starting date: 07/18/2021 Today's weight: 183 Today's date: 07/18/2024 Total lbs lost to date: - 62 lbs Total lbs lost since last in-office visit: -5 lbs Total weight loss percentage to date: -25.31% Body mass index is 28.79 kg/m.   Nutrition Plan: 1600cal, 100 g protein, > 60 ounces Anti-obesity medications: Mounjaro . Reported side effects: Dose may be too high. Hunger is well controlled. Cravings are well controlled. Activity: today will be +495 classes Pilates Sleep: Number of hours slept each night: 8. Sleep is restful.   Subjective:   History of Present Illness Tammy Mooney is a 43 year old female with diabetes and obesity who presents for medication management and follow-up.  Hyperlipidemia and cardiovascular risk - Slightly elevated LDL cholesterol on recent laboratory testing - Total cholesterol and HDL cholesterol within normal limits - ASCVD risk calculated at 1% - Not currently taking a statin  Obesity and weight management - Current weight 185-186 lb, representing a slight decrease from previous measurements - Taking 10 mg weight management medication, but experiencing side effects perceived as dose-related - Reduced appetite attributed to current medication - Engages in exercise 3-4 times per week and regularly participates in Pilates  Polycystic ovary syndrome (pcos) and androgenic symptoms - History of PCOS - Previously taking spironolactone  25 mg for hair loss, discontinued 1-2 weeks ago after running out - Experiencing dizziness when standing quickly - Recent blood pressure measured at 96/56 mmHg  Nonalcoholic fatty liver disease - History of fatty liver with previously elevated AST and ALT, now normalized  Thyroid  enlargement - History of goiter, improved with weight loss - Thyroid  ultrasound performed several years ago was normal  Dermatologic conditions - Lichen sclerosus managed with  topical ointment - Rosacea treated with doxycycline  during flares  Nutritional intake and hydration - Attempting to increase protein intake to 100 g per day, but struggling due to reduced appetite from medication - Consuming approximately 30-40 oz of fluids daily, with a goal of 60 oz  Sleep disturbance - Takes trazodone  for sleep, which effectively maintains sleep  Menstrual irregularity and contraception - No true menstrual cycle since August - Considering IUD reinsertion after a failed attempt  Review of Systems: Negative, with the exception of above mentioned in HPI.  History:   Reviewed by clinician on day of visit: allergies, medications, problem list, medical history, surgical history, family history, social history, and previous encounter notes.  Medications:   Show/hide medication list[1] Allergies[2]  Objective:   BP (!) 96/56 (BP Location: Right Arm, Cuff Size: Normal)   Pulse 75   Ht 5' 7 (1.702 m)   Wt 183 lb 12.8 oz (83.4 kg)   LMP  (LMP Unknown)   SpO2 96%   BMI 28.79 kg/m   Physical Exam Constitutional:      General: She is not in acute distress.    Appearance: She is well-developed.  HENT:     Head: Normocephalic and atraumatic.  Eyes:     Conjunctiva/sclera: Conjunctivae normal.  Cardiovascular:     Rate and Rhythm: Normal rate and regular rhythm.     Heart sounds: Normal heart sounds.  Pulmonary:     Effort: Pulmonary effort is normal.     Breath sounds: Normal breath sounds.  Neurological:     General: No focal deficit present.     Mental Status: She is alert.  Psychiatric:        Behavior: Behavior normal.    Results for  orders placed or performed in visit on 03/18/24  TSH   Collection Time: 03/18/24 10:53 AM  Result Value Ref Range   TSH 1.43 0.35 - 5.50 uIU/mL  VITAMIN D  25 Hydroxy (Vit-D Deficiency, Fractures)   Collection Time: 03/18/24 10:53 AM  Result Value Ref Range   VITD 34.61 30.00 - 100.00 ng/mL  CBC with  Differential/Platelet   Collection Time: 03/18/24 10:53 AM  Result Value Ref Range   WBC 6.6 4.0 - 10.5 K/uL   RBC 4.97 3.87 - 5.11 Mil/uL   Hemoglobin 14.9 12.0 - 15.0 g/dL   HCT 55.5 63.9 - 53.9 %   MCV 89.2 78.0 - 100.0 fl   MCHC 33.5 30.0 - 36.0 g/dL   RDW 86.8 88.4 - 84.4 %   Platelets 349.0 150.0 - 400.0 K/uL   Neutrophils Relative % 65.3 43.0 - 77.0 %   Lymphocytes Relative 25.9 12.0 - 46.0 %   Monocytes Relative 6.5 3.0 - 12.0 %   Eosinophils Relative 1.1 0.0 - 5.0 %   Basophils Relative 1.2 0.0 - 3.0 %   Neutro Abs 4.3 1.4 - 7.7 K/uL   Lymphs Abs 1.7 0.7 - 4.0 K/uL   Monocytes Absolute 0.4 0.1 - 1.0 K/uL   Eosinophils Absolute 0.1 0.0 - 0.7 K/uL   Basophils Absolute 0.1 0.0 - 0.1 K/uL  Comprehensive metabolic panel with GFR   Collection Time: 03/18/24 10:53 AM  Result Value Ref Range   Sodium 140 135 - 145 mEq/L   Potassium 4.1 3.5 - 5.1 mEq/L   Chloride 105 96 - 112 mEq/L   CO2 23 19 - 32 mEq/L   Glucose, Bld 72 70 - 99 mg/dL   BUN 22 6 - 23 mg/dL   Creatinine, Ser 9.29 0.40 - 1.20 mg/dL   Total Bilirubin 0.6 0.2 - 1.2 mg/dL   Alkaline Phosphatase 60 39 - 117 U/L   AST 16 0 - 37 U/L   ALT 22 0 - 35 U/L   Total Protein 7.0 6.0 - 8.3 g/dL   Albumin 4.5 3.5 - 5.2 g/dL   GFR 894.80 >39.99 mL/min   Calcium 9.8 8.4 - 10.5 mg/dL  Lipid panel   Collection Time: 03/18/24 10:53 AM  Result Value Ref Range   Cholesterol 174 0 - 200 mg/dL   Triglycerides 889.9 0.0 - 149.0 mg/dL   HDL 51.99 >60.99 mg/dL   VLDL 77.9 0.0 - 59.9 mg/dL   LDL Cholesterol 895 (H) 0 - 99 mg/dL   Total CHOL/HDL Ratio 4    NonHDL 126.09   Vitamin B12   Collection Time: 03/18/24 10:53 AM  Result Value Ref Range   Vitamin B-12 286 211 - 911 pg/mL  Hemoglobin A1c   Collection Time: 03/18/24 10:53 AM  Result Value Ref Range   Hgb A1c MFr Bld 5.2 4.6 - 6.5 %  Microalbumin / creatinine urine ratio   Collection Time: 03/18/24 10:54 AM  Result Value Ref Range   Microalb, Ur 1.3 0.0 - 1.9 mg/dL    Creatinine,U 861.2 mg/dL   Microalb Creat Ratio 9.2 0.0 - 30.0 mg/g    Diagnoses and Orders:   1. Controlled type 2 diabetes mellitus without complication, without long-term current use of insulin  ([name])   2. Primary hypothyroidism   3. Rosacea   4. Primary insomnia   5. Anxiety   6. Perimenopausal symptoms   7. 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 History of obesity   9. Overweight with body mass  index (BMI) of 28 to 28.9 in adult    Meds ordered this encounter  Medications   levothyroxine  (SYNTHROID ) 112 MCG tablet    Sig: Take 1 tablet (112 mcg total) by mouth daily.    Dispense:  90 tablet    Refill:  3   diazepam  (VALIUM ) 5 MG tablet    Sig: Take 1 tablet (5 mg total) by mouth every 12 (twelve) hours as needed for anxiety.    Dispense:  5 tablet    Refill:  0   venlafaxine  XR (EFFEXOR -XR) 37.5 MG 24 hr capsule    Sig: Take 1 capsule (37.5 mg total) by mouth daily with food    Dispense:  90 capsule    Refill:  3   tirzepatide  (MOUNJARO ) 10 MG/0.5ML Pen    Sig: Inject 10 mg into the skin once a week.    Dispense:  6 mL    Refill:  1   spironolactone  (ALDACTONE ) 25 MG tablet    Sig: Take 1 tablet (25 mg total) by mouth daily.    Dispense:  30 tablet    Refill:  2   Assessment & Plan:   Mommy makeover? Intertrigo. Assessment and Plan Assessment & Plan Obesity, status post bariatric surgery Obesity management post-bariatric surgery with current weight maintenance. Current weight is slightly down from previous measurements. Engages in regular exercise and aims to increase protein intake to 100 grams per day. Fluid intake is below recommended levels.  - Encouraged increased protein intake to 100 grams per day. - Encouraged increased fluid intake to 60 ounces per day. - Continue regular exercise regimen.  Polycystic ovary syndrome PCOS management with spironolactone  for hair loss. Currently not taking spironolactone  due to running out of  medication. Blood pressure is low at 96/56 mmHg, but she feels well with occasional dizziness upon standing quickly. - Refilled spironolactone  prescription.  Rosacea Managed with doxycycline  during flare-ups. Dermatologist discussed Accutane but she opted against it due to the intensive treatment plan. Uses topical treatments for skin smoothness. - Continue doxycycline  during flare-ups. - Continue topical treatments as needed.  Lichen sclerosus Diagnosed by dermatologist. Not itchy and managed with topical treatments for smoothness. - Continue topical treatments as needed.  Perimenopausal symptoms with menstrual irregularity Perimenopausal symptoms with menstrual irregularity. No recent menstrual cycle since August, but not pregnant. Plans to retry IUD insertion with cervical dilation suppository. No pain management provided for insertion, but she is open to medication if needed. - Retry IUD insertion with cervical dilation suppository. - Will consider pain management medication for insertion if needed. Okay to send in Valium  for procedure due to patient's anxiety surrounding it.   Insomnia Managed with trazodone , which is effective in maintaining sleep throughout the night. - Continue trazodone  for insomnia management.  I personally spent a total of 45 minutes in the care of the patient today including preparing to see the patient, performing a medically appropriate exam/evaluation, counseling and educating, placing orders, and documenting clinical information in the EHR.  Geni Shutter, DO, MS, FAAFP, Dipl. [facility] [address] [place], [address] Dept: [phone] Dept Fax: [phone]  Attestations:   Patient is well-known to me from previous care setting and is establishing care in this system with me as PCP. Chart updated today with reconciliation of problem list, medications, allergies, and relevant history. Preventive  care and chronic disease status reviewed.  Portions of historical chart may remain incomplete; will update on an ongoing basis as  clinically indicated.  Discussed the use of AI scribe software for clinical note transcription with the patient, who gave verbal consent to proceed.      [1]  Outpatient Medications Prior to Visit  Medication Sig   doxycycline  (VIBRAMYCIN ) 100 MG capsule Take 1 capsule (100 mg total) by mouth 2 (two) times daily.   doxycycline  (VIBRAMYCIN ) 50 MG capsule Take 1 capsule (50 mg total) by mouth 2 (two) times daily as needed for flares. Take with food.   fluticasone  (FLONASE ) 50 MCG/ACT nasal spray Place 1 spray into both nostrils daily.   misoprostol  (CYTOTEC ) 200 MCG tablet Place 2 tablets (400 mcg total) vaginally 4 hours before procedure.   vitamin B-12 (CYANOCOBALAMIN ) 1000 MCG tablet Take 1 tablet (1,000 mcg total) by mouth daily.   [DISCONTINUED] levothyroxine  (SYNTHROID ) 112 MCG tablet Take 1 tablet (112 mcg total) by mouth daily.   [DISCONTINUED] tirzepatide  (MOUNJARO ) 10 MG/0.5ML Pen Inject 10 mg into the skin once a week.   [DISCONTINUED] venlafaxine  XR (EFFEXOR -XR) 37.5 MG 24 hr capsule Take 1 capsule (37.5 mg total) by mouth daily with food   calcium gluconate 500 MG tablet Take 1 tablet by mouth 3 (three) times daily.   cholecalciferol (VITAMIN D3) 25 MCG (1000 UNIT) tablet Take 1,000 Units by mouth daily.   Multiple Vitamin (MULTIVITAMIN PO) Take 1 tablet by mouth daily.   norethindrone -ethinyl estradiol -FE (BLISOVI  FE 1/20) 1-20 MG-MCG tablet Take 1 tablet by mouth daily.   tacrolimus  (PROTOPIC ) 0.1 % ointment Apply 1 Application topically daily.   traZODone  (DESYREL ) 100 MG tablet Take 1 tablet (100 mg total) by mouth at bedtime.   [DISCONTINUED] spironolactone  (ALDACTONE ) 25 MG tablet Take 1 tablet (25 mg total) by mouth daily.   No facility-administered medications prior to visit.  [2]  Allergies Allergen Reactions   Adhesive [Tape] Rash and Other  (See Comments)    Plastic tape causes rash, paper tape is okay, tegaderm is ok   Penicillins Rash    Did it involve swelling of the face/tongue/throat, SOB, or low BP? N Did it involve sudden or severe rash/hives, skin peeling, or any reaction on the inside of your mouth or nose? Y Did you need to seek medical attention at a hospital or doctor's office? N When did it last happen?  childhood If all above answers are NO, may proceed with cephalosporin use.    "

## 2024-07-20 ENCOUNTER — Other Ambulatory Visit (HOSPITAL_BASED_OUTPATIENT_CLINIC_OR_DEPARTMENT_OTHER): Payer: Self-pay

## 2024-07-20 MED ORDER — DOXYCYCLINE HYCLATE 50 MG PO CAPS
50.0000 mg | ORAL_CAPSULE | Freq: Every day | ORAL | 11 refills | Status: AC | PRN
  Filled 2024-07-20 – 2024-07-25 (×2): qty 30, 30d supply, fill #0
  Filled 2024-08-17: qty 30, 30d supply, fill #1

## 2024-07-25 ENCOUNTER — Other Ambulatory Visit (HOSPITAL_BASED_OUTPATIENT_CLINIC_OR_DEPARTMENT_OTHER): Payer: Self-pay

## 2024-07-25 ENCOUNTER — Other Ambulatory Visit (HOSPITAL_COMMUNITY): Payer: Self-pay

## 2024-07-26 ENCOUNTER — Other Ambulatory Visit (HOSPITAL_COMMUNITY): Payer: Self-pay

## 2024-07-26 ENCOUNTER — Other Ambulatory Visit: Payer: Self-pay

## 2024-08-10 DIAGNOSIS — E119 Type 2 diabetes mellitus without complications: Secondary | ICD-10-CM

## 2024-08-17 ENCOUNTER — Other Ambulatory Visit: Payer: Self-pay

## 2024-08-17 ENCOUNTER — Other Ambulatory Visit (HOSPITAL_COMMUNITY): Payer: Self-pay

## 2024-08-17 ENCOUNTER — Ambulatory Visit

## 2024-08-17 NOTE — Progress Notes (Signed)
 Tammy Mooney is here today for education and teaching regarding how to microdose their GLP medication. Microdosing is the practice of taking lower or smaller doses of a GLP to potentially reduce side effects, reduce cost, and can be used to aid in long term weight maintenance. Patient has purchased and brought in with them, one pen of the Tirzepatide  (Mounjaro , Zepbound ) at the 10mg  dose. Insulin  syringes were provided. Doses of the medication will be given in the amount of units, rather than mL. After obtaining consent and understanding on what this visit would consist of, and per orders of Dr Prentiss, Tammy Mooney was given thorough and careful instruction on what to do. Step by step instructions were given verbally and demonstrated to the patient by [facility]. Once Tammy Mooney verbally indicated that they were comfortable with the process, we slowly went through each step again while patient performed them. Tammy Mooney has been instructed that while their dose may be changed in the future, they will use the same process, just drawing up the amount of units that are indicated by their provider. Patient successfully drew up 25 units, equalling 5mg  of Tirzepatide  (Mounjaro , Zepbound ). She will administer this medication to herself on her chosen injection day, as she is already familiar with Mounjaro  and how to give it. All questions that patient had were answered. Tammy Mooney is comfortable with this process and continuing to give this injection to themselves at home.  **Pt has been instructed that they are NOT to change their medication dose without first consulting AND receiving approval from their physician!** THESE CHANGES MUST BE DOCUMENTED IN PT CHART

## 2024-08-24 ENCOUNTER — Other Ambulatory Visit: Payer: Self-pay | Admitting: Family

## 2024-08-26 ENCOUNTER — Other Ambulatory Visit (HOSPITAL_COMMUNITY): Payer: Self-pay

## 2024-08-26 MED ORDER — TIRZEPATIDE 12.5 MG/0.5ML ~~LOC~~ SOAJ
12.5000 mg | SUBCUTANEOUS | 0 refills | Status: AC
  Filled 2024-08-26: qty 2, 28d supply, fill #0

## 2024-08-26 NOTE — Addendum Note (Signed)
 Addended by: Azalya Galyon on: 08/26/2024 04:51 PM   Modules accepted: Orders

## 2024-10-17 ENCOUNTER — Ambulatory Visit: Admitting: Family Medicine
